# Patient Record
Sex: Female | Born: 1955 | Race: White | Hispanic: No | Marital: Single | Smoking: Former smoker
Health system: Southern US, Community
[De-identification: ages and names within clinical notes are randomized; demographics above are authoritative.]

## PROBLEM LIST (undated history)

## (undated) DIAGNOSIS — F4312 Post-traumatic stress disorder, chronic: Secondary | ICD-10-CM

## (undated) DIAGNOSIS — T50902A Poisoning by unspecified drugs, medicaments and biological substances, intentional self-harm, initial encounter: Secondary | ICD-10-CM

## (undated) DIAGNOSIS — I10 Essential (primary) hypertension: Secondary | ICD-10-CM

## (undated) DIAGNOSIS — F329 Major depressive disorder, single episode, unspecified: Secondary | ICD-10-CM

## (undated) DIAGNOSIS — F1020 Alcohol dependence, uncomplicated: Secondary | ICD-10-CM

## (undated) DIAGNOSIS — F32A Depression, unspecified: Secondary | ICD-10-CM

## (undated) HISTORY — DX: Post-traumatic stress disorder, chronic: F43.12

## (undated) HISTORY — PX: FRACTURE SURGERY: SHX138

## (undated) HISTORY — PX: GANGLION CYST EXCISION: SHX1691

---

## 1999-09-03 ENCOUNTER — Other Ambulatory Visit: Admission: RE | Admit: 1999-09-03 | Discharge: 1999-09-03 | Payer: Self-pay | Admitting: Obstetrics and Gynecology

## 2000-10-22 ENCOUNTER — Other Ambulatory Visit: Admission: RE | Admit: 2000-10-22 | Discharge: 2000-10-22 | Payer: Self-pay | Admitting: Obstetrics and Gynecology

## 2001-12-04 ENCOUNTER — Other Ambulatory Visit: Admission: RE | Admit: 2001-12-04 | Discharge: 2001-12-04 | Payer: Self-pay | Admitting: Obstetrics and Gynecology

## 2002-11-25 ENCOUNTER — Ambulatory Visit (HOSPITAL_BASED_OUTPATIENT_CLINIC_OR_DEPARTMENT_OTHER): Admission: RE | Admit: 2002-11-25 | Discharge: 2002-11-25 | Payer: Self-pay | Admitting: Orthopedic Surgery

## 2003-01-10 ENCOUNTER — Ambulatory Visit (HOSPITAL_COMMUNITY): Admission: RE | Admit: 2003-01-10 | Discharge: 2003-01-10 | Payer: Self-pay | Admitting: *Deleted

## 2003-01-10 ENCOUNTER — Encounter: Payer: Self-pay | Admitting: *Deleted

## 2003-02-28 ENCOUNTER — Other Ambulatory Visit: Admission: RE | Admit: 2003-02-28 | Discharge: 2003-02-28 | Payer: Self-pay | Admitting: Obstetrics and Gynecology

## 2004-05-18 ENCOUNTER — Other Ambulatory Visit: Admission: RE | Admit: 2004-05-18 | Discharge: 2004-05-18 | Payer: Self-pay | Admitting: Obstetrics and Gynecology

## 2004-05-22 ENCOUNTER — Encounter (INDEPENDENT_AMBULATORY_CARE_PROVIDER_SITE_OTHER): Payer: Self-pay | Admitting: Specialist

## 2004-05-22 ENCOUNTER — Encounter: Admission: RE | Admit: 2004-05-22 | Discharge: 2004-05-22 | Payer: Self-pay | Admitting: Obstetrics and Gynecology

## 2004-05-29 HISTORY — PX: BREAST BIOPSY: SHX20

## 2004-06-01 ENCOUNTER — Encounter: Admission: RE | Admit: 2004-06-01 | Discharge: 2004-06-01 | Payer: Self-pay | Admitting: Obstetrics and Gynecology

## 2004-07-01 HISTORY — PX: BREAST BIOPSY: SHX20

## 2004-09-02 HISTORY — PX: COLONOSCOPY: SHX174

## 2004-09-04 ENCOUNTER — Ambulatory Visit: Payer: Self-pay | Admitting: Internal Medicine

## 2004-09-12 ENCOUNTER — Ambulatory Visit: Payer: Self-pay | Admitting: Internal Medicine

## 2004-12-05 ENCOUNTER — Encounter: Admission: RE | Admit: 2004-12-05 | Discharge: 2004-12-05 | Payer: Self-pay | Admitting: Obstetrics and Gynecology

## 2005-05-20 ENCOUNTER — Other Ambulatory Visit: Admission: RE | Admit: 2005-05-20 | Discharge: 2005-05-20 | Payer: Self-pay | Admitting: Obstetrics and Gynecology

## 2005-05-29 ENCOUNTER — Encounter: Admission: RE | Admit: 2005-05-29 | Discharge: 2005-05-29 | Payer: Self-pay | Admitting: Obstetrics and Gynecology

## 2006-06-20 ENCOUNTER — Encounter: Admission: RE | Admit: 2006-06-20 | Discharge: 2006-06-20 | Payer: Self-pay | Admitting: Obstetrics and Gynecology

## 2006-12-07 ENCOUNTER — Emergency Department (HOSPITAL_COMMUNITY): Admission: EM | Admit: 2006-12-07 | Discharge: 2006-12-08 | Payer: Self-pay | Admitting: Emergency Medicine

## 2007-07-01 ENCOUNTER — Encounter: Admission: RE | Admit: 2007-07-01 | Discharge: 2007-07-01 | Payer: Self-pay | Admitting: Obstetrics and Gynecology

## 2008-07-04 ENCOUNTER — Encounter: Admission: RE | Admit: 2008-07-04 | Discharge: 2008-07-04 | Payer: Self-pay | Admitting: Obstetrics and Gynecology

## 2009-07-20 ENCOUNTER — Encounter: Admission: RE | Admit: 2009-07-20 | Discharge: 2009-07-20 | Payer: Self-pay | Admitting: Obstetrics and Gynecology

## 2010-07-21 ENCOUNTER — Encounter: Payer: Self-pay | Admitting: Obstetrics and Gynecology

## 2010-08-20 ENCOUNTER — Other Ambulatory Visit: Payer: Self-pay | Admitting: Obstetrics and Gynecology

## 2010-08-20 DIAGNOSIS — Z1231 Encounter for screening mammogram for malignant neoplasm of breast: Secondary | ICD-10-CM

## 2010-08-22 ENCOUNTER — Ambulatory Visit
Admission: RE | Admit: 2010-08-22 | Discharge: 2010-08-22 | Disposition: A | Discharge status: Home / Self Care | Payer: 59 | Source: Ambulatory Visit | Attending: Obstetrics and Gynecology | Admitting: Obstetrics and Gynecology

## 2010-08-22 DIAGNOSIS — Z1231 Encounter for screening mammogram for malignant neoplasm of breast: Secondary | ICD-10-CM

## 2010-11-13 NOTE — Op Note (Signed)
NAME:  LINDAANN, GRADILLA NO.:  [id number]   MEDICAL RECORD NO.:  [id number]          PATIENT TYPE:  INP   LOCATION:  2550                         FACILITY:  [facility]   PHYSICIAN:  Dyke Brackett, M.D.    DATE OF BIRTH:  Jun 29, 1956   DATE OF PROCEDURE:  DATE OF DISCHARGE:                               OPERATIVE REPORT   INDICATIONS:  A 55 year old with a displaced bimalleolar ankle fracture  involving lateral posterior malleolus, thought to be minimal to  overnight hospitalization.   PRE AND POSTOP DIAGNOSIS:  Bimalleolar ankle fracture (lateral  posterior) left ankle displaced with a fracture dislocation.   OPERATION:  Closed reduction dislocation with open reduction fixation to  bimalleolar ankle fracture.   SURGEON:  Dyke Brackett, M.D.   ASSISTANTAlisa Graff, P.A.   TOURNIQUET TIME:  Approximately 45 minutes.   Revision and reduction of dislocation was carried out.  A lateral  incision was made exposure to the fibula.  Weber-type B fibular fracture  with posterior displacement of the foot was noted preoperatively, some  correction with the close reduction was obtained, followed by anatomical  reduction of the fibula.  Fixation with the 7 hole semitubular plate  with good purchase, excellent reduction.  Anatomic reduction was  obtained.  Once the anatomic reduction was obtained, it was not required  to fix the posteromedial fragment.  There was no involvement of the  medial malleolus.  There was involvement of the posterior malleolus of  about 20% to 25% of the joint surface, but this was well aligned and  seemed to be very stable under fluoroscopy.  In view of this, the wound  was closed with 2-0 Vicryl and skin clips.  Marcaine with epinephrine to  the skin, light compressive sterile dressing, posterior plaster splint  applied.  Taken to the recovery room in stable condition.      Dyke Brackett, M.D.  Electronically Signed     WDC/MEDQ  D:  12/07/2006   T:  12/07/2006  Job:  [id number]

## 2010-11-16 NOTE — Op Note (Signed)
NAME:  [name], [name]                         ACCOUNT NO.:  [id number]   MEDICAL RECORD NO.:  [id number]                   PATIENT TYPE:  AMB   LOCATION:  DSC                                  FACILITY:  [facility]   PHYSICIAN:  Loreta Ave, M.D.              DATE OF BIRTH:  Mar 29, 1956   DATE OF PROCEDURE:  11/25/2002  DATE OF DISCHARGE:                                 OPERATIVE REPORT   PREOPERATIVE DIAGNOSIS:  Symptomatic ganglion cyst superomedial aspect,  first MP joint left foot.  Underlying synovitis, spurring, and degenerative  arthritis, first MP joint.   POSTOPERATIVE DIAGNOSIS:  Symptomatic ganglion cyst superomedial aspect,  first MP joint left foot.  Underlying synovitis, spurring, and degenerative  arthritis, first MP joint.   OPERATIVE PROCEDURE:  Excision cyst first MP joint, left foot with  synovectomy MP joint and removal of periarticular spurs.   SURGEON:  Loreta Ave, M.D.   ASSISTANT:  Arlys John D. Petrarca, P.A.-C.   ANESTHESIA:  General.   ESTIMATED BLOOD LOSS:  Minimal.   TOURNIQUET TIME:  35 minutes.   SPECIMENS:  None.   CULTURES:  None.   COMPLICATIONS:  None.   DRESSINGS:  Soft compressive with a wooden shoe.   PROCEDURE:  The patient was brought to the operating room and placed on the  operating table in supine position.  After adequate anesthesia had been  obtained, a tourniquet was applied to the left calf.  She was prepped and  draped in the usual sterile fashion.  Exsanguinated with elevation of  Esmarch, tourniquet inflated to 250 mmHg.  The previous incision she had  medially was opened and extended proximal and distal in order to retract the  skin up to the cyst which was superomedial.  The cyst was excised in its  entirety tracking it down to the MP joint with the whole root of the cyst  removed.  A longitudinal opening of the MP joint, itself.  Periarticular  spurs primarily medial and superomedial debrided.  These were not  extensive,  but after debridement and synovectomy of the joint, itself, she had easy  full passive motion with good stability.  I did not compromise the medial  capsular structures.  Only grade 2, mild grade 3 changes of the joint.  The  wound was thoroughly irrigated.  The joint was closed with Vicryl.  The  skin was closed with nylon.  The margins were injected with Marcaine.  A  sterile compressive dressing and wooden shoe was applied.  The tourniquet  was deflated and removed.  Anesthesia was reversed.  She was brought to the  recovery room.  Tolerated the surgery well without complications.  Loreta Ave, M.D.    DFM/MEDQ  D:  11/25/2002  T:  11/25/2002  Job:  [id number]

## 2011-01-28 ENCOUNTER — Emergency Department (HOSPITAL_COMMUNITY)
Admission: EM | Admit: 2011-01-28 | Discharge: 2011-01-29 | Disposition: A | Discharge status: Home / Self Care | Payer: 59 | Attending: Emergency Medicine | Admitting: Emergency Medicine

## 2011-01-28 DIAGNOSIS — F3289 Other specified depressive episodes: Secondary | ICD-10-CM | POA: Insufficient documentation

## 2011-01-28 DIAGNOSIS — T43294A Poisoning by other antidepressants, undetermined, initial encounter: Secondary | ICD-10-CM | POA: Insufficient documentation

## 2011-01-28 DIAGNOSIS — I10 Essential (primary) hypertension: Secondary | ICD-10-CM | POA: Insufficient documentation

## 2011-01-28 DIAGNOSIS — T43502A Poisoning by unspecified antipsychotics and neuroleptics, intentional self-harm, initial encounter: Secondary | ICD-10-CM | POA: Insufficient documentation

## 2011-01-28 DIAGNOSIS — E7800 Pure hypercholesterolemia, unspecified: Secondary | ICD-10-CM | POA: Insufficient documentation

## 2011-01-28 DIAGNOSIS — F329 Major depressive disorder, single episode, unspecified: Secondary | ICD-10-CM | POA: Insufficient documentation

## 2011-01-28 DIAGNOSIS — E876 Hypokalemia: Secondary | ICD-10-CM | POA: Insufficient documentation

## 2011-01-28 LAB — URINALYSIS, ROUTINE W REFLEX MICROSCOPIC
Bilirubin Urine: NEGATIVE
Glucose, UA: NEGATIVE mg/dL
Ketones, ur: NEGATIVE mg/dL
Nitrite: NEGATIVE
Protein, ur: NEGATIVE mg/dL
Specific Gravity, Urine: 1.008 (ref 1.005–1.030)
Urobilinogen, UA: 0.2 mg/dL (ref 0.0–1.0)
pH: 6 (ref 5.0–8.0)

## 2011-01-28 LAB — URINE MICROSCOPIC-ADD ON

## 2011-01-28 LAB — CBC
HCT: 41.3 % (ref 36.0–46.0)
Hemoglobin: 14 g/dL (ref 12.0–15.0)
MCH: 29.5 pg (ref 26.0–34.0)
MCHC: 33.9 g/dL (ref 30.0–36.0)
MCV: 86.9 fL (ref 78.0–100.0)
Platelets: 375 10*3/uL (ref 150–400)
RBC: 4.75 MIL/uL (ref 3.87–5.11)
RDW: 12.2 % (ref 11.5–15.5)
WBC: 5.4 10*3/uL (ref 4.0–10.5)

## 2011-01-28 LAB — COMPREHENSIVE METABOLIC PANEL
ALT: 29 U/L (ref 0–35)
AST: 24 U/L (ref 0–37)
Albumin: 4.6 g/dL (ref 3.5–5.2)
Alkaline Phosphatase: 121 U/L — ABNORMAL HIGH (ref 39–117)
BUN: 12 mg/dL (ref 6–23)
CO2: 22 mEq/L (ref 19–32)
Calcium: 9.3 mg/dL (ref 8.4–10.5)
Chloride: 102 mEq/L (ref 96–112)
Creatinine, Ser: 0.55 mg/dL (ref 0.50–1.10)
GFR calc Af Amer: >60 mL/min (ref >60)
GFR calc non Af Amer: >60 mL/min (ref >60)
Glucose, Bld: 138 mg/dL — ABNORMAL HIGH (ref 70–99)
Potassium: 3.3 mEq/L — ABNORMAL LOW (ref 3.5–5.1)
Sodium: 138 mEq/L (ref 135–145)
Total Bilirubin: 0.2 mg/dL — ABNORMAL LOW (ref 0.3–1.2)
Total Protein: 8 g/dL (ref 6.0–8.3)

## 2011-01-28 LAB — RAPID URINE DRUG SCREEN, HOSP PERFORMED
Amphetamines: NOT DETECTED
Barbiturates: NOT DETECTED
Benzodiazepines: NOT DETECTED
Cocaine: NOT DETECTED
Opiates: NOT DETECTED
Tetrahydrocannabinol: NOT DETECTED

## 2011-01-28 LAB — DIFFERENTIAL
Basophils Absolute: 0.1 10*3/uL (ref 0.0–0.1)
Basophils Relative: 1 % (ref 0–1)
Eosinophils Absolute: 0.1 10*3/uL (ref 0.0–0.7)
Eosinophils Relative: 2 % (ref 0–5)
Lymphocytes Relative: 39 % (ref 12–46)
Lymphs Abs: 2.1 10*3/uL (ref 0.7–4.0)
Monocytes Absolute: 0.5 10*3/uL (ref 0.1–1.0)
Monocytes Relative: 9 % (ref 3–12)
Neutro Abs: 2.7 10*3/uL (ref 1.7–7.7)
Neutrophils Relative %: 50 % (ref 43–77)

## 2011-01-28 LAB — ETHANOL: Alcohol, Ethyl (B): 126 mg/dL — ABNORMAL HIGH (ref 0–11)

## 2011-01-28 LAB — POCT PREGNANCY, URINE: Preg Test, Ur: NEGATIVE

## 2011-01-28 LAB — ACETAMINOPHEN LEVEL: Acetaminophen (Tylenol), Serum: <15 ug/mL (ref 10–30)

## 2011-01-30 ENCOUNTER — Emergency Department (HOSPITAL_COMMUNITY)
Admission: EM | Admit: 2011-01-30 | Discharge: 2011-01-31 | Disposition: A | Discharge status: Other Acute Inpatient Hospital | Payer: 59 | Attending: Emergency Medicine | Admitting: Emergency Medicine

## 2011-01-30 DIAGNOSIS — F1010 Alcohol abuse, uncomplicated: Secondary | ICD-10-CM | POA: Insufficient documentation

## 2011-01-30 DIAGNOSIS — T43294A Poisoning by other antidepressants, undetermined, initial encounter: Secondary | ICD-10-CM | POA: Insufficient documentation

## 2011-01-30 DIAGNOSIS — F329 Major depressive disorder, single episode, unspecified: Secondary | ICD-10-CM | POA: Insufficient documentation

## 2011-01-30 DIAGNOSIS — I10 Essential (primary) hypertension: Secondary | ICD-10-CM | POA: Insufficient documentation

## 2011-01-30 DIAGNOSIS — E7800 Pure hypercholesterolemia, unspecified: Secondary | ICD-10-CM | POA: Insufficient documentation

## 2011-01-30 DIAGNOSIS — T43502A Poisoning by unspecified antipsychotics and neuroleptics, intentional self-harm, initial encounter: Secondary | ICD-10-CM | POA: Insufficient documentation

## 2011-01-30 DIAGNOSIS — F3289 Other specified depressive episodes: Secondary | ICD-10-CM | POA: Insufficient documentation

## 2011-01-30 LAB — CBC
HCT: 38.8 % (ref 36.0–46.0)
Hemoglobin: 13.7 g/dL (ref 12.0–15.0)
MCH: 30.4 pg (ref 26.0–34.0)
MCHC: 35.3 g/dL (ref 30.0–36.0)
MCV: 86.2 fL (ref 78.0–100.0)
Platelets: 344 10*3/uL (ref 150–400)
RBC: 4.5 MIL/uL (ref 3.87–5.11)
RDW: 12.3 % (ref 11.5–15.5)
WBC: 6.4 10*3/uL (ref 4.0–10.5)

## 2011-01-30 LAB — DIFFERENTIAL
Basophils Absolute: 0 10*3/uL (ref 0.0–0.1)
Basophils Relative: 1 % (ref 0–1)
Eosinophils Absolute: 0.1 10*3/uL (ref 0.0–0.7)
Eosinophils Relative: 1 % (ref 0–5)
Lymphocytes Relative: 38 % (ref 12–46)
Lymphs Abs: 2.4 10*3/uL (ref 0.7–4.0)
Monocytes Absolute: 0.5 10*3/uL (ref 0.1–1.0)
Monocytes Relative: 8 % (ref 3–12)
Neutro Abs: 3.4 10*3/uL (ref 1.7–7.7)
Neutrophils Relative %: 53 % (ref 43–77)

## 2011-01-30 LAB — BASIC METABOLIC PANEL
Chloride: 100 mEq/L (ref 96–112)
Creatinine, Ser: 0.51 mg/dL (ref 0.50–1.10)
GFR calc Af Amer: >60 mL/min (ref >60)
GFR calc non Af Amer: >60 mL/min (ref >60)

## 2011-01-30 LAB — ETHANOL: Alcohol, Ethyl (B): 186 mg/dL — ABNORMAL HIGH (ref 0–11)

## 2011-01-30 LAB — SALICYLATE LEVEL: Salicylate Lvl: <2 mg/dL — ABNORMAL LOW (ref 2.8–20.0)

## 2011-01-30 LAB — ACETAMINOPHEN LEVEL: Acetaminophen (Tylenol), Serum: <15 ug/mL (ref 10–30)

## 2011-01-31 ENCOUNTER — Inpatient Hospital Stay (HOSPITAL_COMMUNITY)
Admission: AD | Admit: 2011-01-31 | Discharge: 2011-02-04 | DRG: 897 | Disposition: A | Discharge status: Home / Self Care | Payer: 59 | Source: Ambulatory Visit | Attending: Psychiatry | Admitting: Psychiatry

## 2011-01-31 DIAGNOSIS — F3289 Other specified depressive episodes: Secondary | ICD-10-CM

## 2011-01-31 DIAGNOSIS — I10 Essential (primary) hypertension: Secondary | ICD-10-CM

## 2011-01-31 DIAGNOSIS — F17200 Nicotine dependence, unspecified, uncomplicated: Secondary | ICD-10-CM

## 2011-01-31 DIAGNOSIS — R45851 Suicidal ideations: Secondary | ICD-10-CM

## 2011-01-31 DIAGNOSIS — Z818 Family history of other mental and behavioral disorders: Secondary | ICD-10-CM

## 2011-01-31 DIAGNOSIS — E876 Hypokalemia: Secondary | ICD-10-CM

## 2011-01-31 DIAGNOSIS — F1020 Alcohol dependence, uncomplicated: Principal | ICD-10-CM

## 2011-01-31 DIAGNOSIS — E7800 Pure hypercholesterolemia, unspecified: Secondary | ICD-10-CM

## 2011-01-31 DIAGNOSIS — F329 Major depressive disorder, single episode, unspecified: Secondary | ICD-10-CM

## 2011-01-31 LAB — ACETAMINOPHEN LEVEL: Acetaminophen (Tylenol), Serum: <15 ug/mL (ref 10–30)

## 2011-01-31 LAB — RAPID URINE DRUG SCREEN, HOSP PERFORMED: Benzodiazepines: NOT DETECTED

## 2011-02-01 DIAGNOSIS — F1020 Alcohol dependence, uncomplicated: Secondary | ICD-10-CM

## 2011-02-05 NOTE — Discharge Summary (Signed)
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  She had beta drinking excessively for the past year.  On Monday night she took approximately 6 tablets of Celexa concurrently with drinking about 6-7 beers.  Two nights later she drank another 6-7 beers along with 10 tablets of Celexa.  She denied suicidal thoughts but seems to get suicidal thinking when she drinks excessively.  HOSPITAL COURSE:  Her potassium was a little low in the emergency room at 3.0.  It was repeated and not considered to be a problem.  During the hospital stay, the family pointed out to the weekend therapist that the patient had planned to simply say that everything was fine and there were no problems so that she could get out of any further involvement in treatment.  Her discharge was delayed by 1 day in order for further intensive outpatient work to be arranged, and this was arranged.  CONDITION ON DISCHARGE:  It was noted that the patient was in a euthymic mood.  On a scale from 1 being the least to 10 the most depressed, she ranked her mood as a 1, and on a scale of 1 the least and 10 the most anxious, she had also rated herself as a 1.  She had clear goal-directed thoughts.  Her conversational speech was natural volume, tone and rate. She had good eye contact.  Insight was improved from input from her family and that she was able to see that her substance use was more of a problem than originally addressed or thought.  Her judgment was also thought to be improving some.  Her memory was intact to recent and remote  events.  FINAL DIAGNOSES:  Axis I: 1. Alcohol dependence. 2. Depressive disorder NOS. 3. Nicotine dependence. Axis II:  Deferred. Axis III:  Hypercholesterolemia and hypertension. Axis IV:  Moderate, occupational and psychosocial stressors related to substance use. Axis V:  GAF 55.  INSTRUCTIONS: 1. Instructions to the family and to the patient were to resume     typical physical activity. 2. Her home medications included Norvasc and simvastatin as well as     the Celexa 20 mg that she was already on. 3. It was recommended that she engage herself in AA meetings, get     involved in the step program with a sponsor so that she could     better accurately address her alcohol issues and also seek to     protect herself by further     working with her employer to find others that can help with the     role that she is currently filling by herself. 4. She also chose to continue the nicotine patches which could help     her be  able to stop the nicotine addiction that she has as well.          ______________________________ Orson Aloe, MD     EW/MEDQ  D:  02/04/2011  T:  02/04/2011  Job:  [id number]  Electronically Signed by Orson Aloe  on 02/05/2011 08:00:27 AM

## 2011-02-05 NOTE — Assessment & Plan Note (Signed)
[name], [name] NO.:  [id number]  MEDICAL RECORD NO.:  [id number]  LOCATION:  0303                          [facility]ACILITY[facility]  [facility]  PHYSICIAN:  Orson Aloe, MD       DATE OF BIRTH:  08/31/1955  DATE OF ADMISSION:  01/31/2011 DATE OF DISCHARGE:                      PSYCHIATRIC ADMISSION ASSESSMENT   IDENTIFICATION:  This is a 55 year old single Caucasian female.  This is a voluntary admission.  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  She reports that she has been drinking excessively for the past year and on Monday night took approximately 6 tablets of her Celexa concurrent with drinking about 6 to 7 beers.  She reports going to the emergency room and subsequently going home.  Denies that she has suicidal intent but when she is drinking does get suicidal thoughts.  On Wednesday then she drank 6 to 7 beers and took approximately 10 tablets of Celexa.  She was referred for admission.  She denies suicidal thoughts today but says she does get suicidal when she is drinking excessively.  She has been drinking a lot for about 1 year now and that is approximately 6 to 7 beers on a typical week night after work and more than 13 beers on a weekend starting in the late afternoon and through the evening and drinks until she passes out.  She is not drinking in the morning prior to work but endorses that her concentration is impaired at work when she is not drinking.  She is asking for help getting off the alcohol and wants to go for sobriety.  PAST PSYCHIATRIC HISTORY:  No previous psychiatric admissions.  She receives Celexa from her primary care physician at [facility].  She has been on Celexa for about 5 years after her youngest son moved in with his father after the father left her.  She is taking 10 mg a day for many years and only within the last few weeks started taking a 20 mg tablet. She  would like to stay on it and feels that it helps her depression and anxiety.  No history of previous suicidal thoughts.  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  She lives alone.  Has supportive siblings and her mother is supportive.  FAMILY HISTORY:  Mother with depression with history of hospitalizations.  ALCOHOL AND DRUG HISTORY:  Denies any history of other substance abuse. Alcohol abuse noted above.  MEDICAL HISTORY:  Primary care provider is Dr. __________ at [facility] on [address]. No history of seizures.  MEDICAL PROBLEMS:  Dyslipidemia and hypertension.  CURRENT MEDICATIONS: 1. Amlodipine 10 mg daily. 2. Celexa 20 mg daily. 3. Zocor 20 mg daily.  POSITIVE PHYSICAL FINDINGS:  Well-nourished, well-developed female with full physical exam as noted in the emergency room.  Alcohol level was 186.  Chemistry and CBC normal with the exception of a potassium of 3.0 which was repleted in the emergency room.  PAST PSYCHIATRIC HISTORY:  Please note no previous treatment for alcohol or substance abuse.  MENTAL STATUS EXAM:  Fully alert, fully  oriented, pleasant 55 year old in full contact with reality.  Good eye contact and speech is normal. Gives a coherent history.  Mood is neutral.  She feels a little bit ashamed of taking the pills.  Clearly recognizes that her alcohol as a problem for her, wants to get off of it, wants to achieve sobriety.  No signs of delirium or confusion.  Thinking is goal directed.  Clearly denying any suicidal thoughts.  Memory intact.  Insight good.  Impulse control and judgment normal.  AXIS I:  Alcohol dependence, depressive disorder NOS. AXIS II:  Deferred. AXIS III:  Hypokalemia repleted in the ED.  Hypertension and dyslipidemia. 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 AXIS V:  Current 46, past year 79.  PLAN:  To  voluntarily admit her with a goal of a safe detox in 4 days and to alleviate any suicidal thoughts.  She is clearly not suicidal today. Her liver enzymes have not been checked but she reports that her primary care physician has been monitoring these on a regular basis most recently 2 months ago when she was drinking in the same pattern, so she has quite a lot of difficulties with venipunctures and she is asymptomatic so we will defer her liver enzymes and allow her primary care physician to follow those.  She has a mild headache today, possibly due to the extra Celexa, wants to continue the Celexa at 20 mg daily and we will defer that until 08/04 and continue her other routine medications.  She is on a Librium protocol was goal of a safe detox in 4 days.     [name], N.P.   ______________________________ Orson Aloe, MD    MAS/MEDQ  D:  02/01/2011  T:  02/01/2011  Job:  [id number]  Electronically Signed by Kari Baars N.P. on 02/04/2011 08:06:21 AM Electronically Signed by Orson Aloe  on 02/05/2011 08:00:22 AM
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
## 2011-04-18 LAB — CBC
HCT: 32.9 — ABNORMAL LOW
HCT: 39.8
Hemoglobin: 13.4
MCV: 89.6
Platelets: 262
RBC: 3.67 — ABNORMAL LOW
RBC: 4.45
WBC: 12.2 — ABNORMAL HIGH
WBC: 9.5

## 2011-04-18 LAB — I-STAT 8, (EC8 V) (CONVERTED LAB)
BUN: 12
Chloride: 104
Glucose, Bld: 110 — ABNORMAL HIGH
Hemoglobin: 14.6
Potassium: 4.1
Sodium: 134 — ABNORMAL LOW
pH, Ven: 7.317 — ABNORMAL HIGH

## 2011-04-18 LAB — BASIC METABOLIC PANEL
Chloride: 103
GFR calc Af Amer: >60
GFR calc non Af Amer: >60
Potassium: 3.6

## 2011-07-10 ENCOUNTER — Emergency Department (HOSPITAL_COMMUNITY)
Admission: EM | Admit: 2011-07-10 | Discharge: 2011-07-11 | Disposition: A | Discharge status: Home / Self Care | Payer: 59 | Attending: Emergency Medicine | Admitting: Emergency Medicine

## 2011-07-10 ENCOUNTER — Encounter (HOSPITAL_COMMUNITY): Payer: Self-pay

## 2011-07-10 DIAGNOSIS — F329 Major depressive disorder, single episode, unspecified: Secondary | ICD-10-CM | POA: Insufficient documentation

## 2011-07-10 DIAGNOSIS — I10 Essential (primary) hypertension: Secondary | ICD-10-CM | POA: Insufficient documentation

## 2011-07-10 DIAGNOSIS — T50901A Poisoning by unspecified drugs, medicaments and biological substances, accidental (unintentional), initial encounter: Secondary | ICD-10-CM | POA: Insufficient documentation

## 2011-07-10 DIAGNOSIS — R404 Transient alteration of awareness: Secondary | ICD-10-CM | POA: Insufficient documentation

## 2011-07-10 DIAGNOSIS — F32A Depression, unspecified: Secondary | ICD-10-CM

## 2011-07-10 DIAGNOSIS — T50904A Poisoning by unspecified drugs, medicaments and biological substances, undetermined, initial encounter: Secondary | ICD-10-CM | POA: Insufficient documentation

## 2011-07-10 DIAGNOSIS — R9431 Abnormal electrocardiogram [ECG] [EKG]: Secondary | ICD-10-CM | POA: Insufficient documentation

## 2011-07-10 DIAGNOSIS — F3289 Other specified depressive episodes: Secondary | ICD-10-CM | POA: Insufficient documentation

## 2011-07-10 HISTORY — DX: Essential (primary) hypertension: I10

## 2011-07-10 NOTE — ED Notes (Signed)
Pt denies suicidal thoughts however has been taking her medications since 1930 because she's depressed and doesn't want to feel that way anymore

## 2011-07-11 ENCOUNTER — Other Ambulatory Visit: Payer: Self-pay

## 2011-07-11 LAB — URINALYSIS, ROUTINE W REFLEX MICROSCOPIC
Glucose, UA: NEGATIVE mg/dL
Specific Gravity, Urine: 1.008 (ref 1.005–1.030)
pH: 6 (ref 5.0–8.0)

## 2011-07-11 LAB — COMPREHENSIVE METABOLIC PANEL
Albumin: 4.3 g/dL (ref 3.5–5.2)
Alkaline Phosphatase: 99 U/L (ref 39–117)
BUN: 11 mg/dL (ref 6–23)
Chloride: 100 mEq/L (ref 96–112)
Creatinine, Ser: 0.53 mg/dL (ref 0.50–1.10)
GFR calc Af Amer: >90 mL/min (ref >90)
Glucose, Bld: 109 mg/dL — ABNORMAL HIGH (ref 70–99)
Potassium: 2.9 mEq/L — ABNORMAL LOW (ref 3.5–5.1)
Total Bilirubin: 0.2 mg/dL — ABNORMAL LOW (ref 0.3–1.2)

## 2011-07-11 LAB — SALICYLATE LEVEL: Salicylate Lvl: <2 mg/dL — ABNORMAL LOW (ref 2.8–20.0)

## 2011-07-11 LAB — CBC
HCT: 38.5 % (ref 36.0–46.0)
Hemoglobin: 13.4 g/dL (ref 12.0–15.0)
MCHC: 34.8 g/dL (ref 30.0–36.0)
MCV: 86.3 fL (ref 78.0–100.0)
RDW: 12.4 % (ref 11.5–15.5)
WBC: 5.1 10*3/uL (ref 4.0–10.5)

## 2011-07-11 LAB — ACETAMINOPHEN LEVEL: Acetaminophen (Tylenol), Serum: <15 ug/mL (ref 10–30)

## 2011-07-11 LAB — URINE MICROSCOPIC-ADD ON

## 2011-07-11 LAB — RAPID URINE DRUG SCREEN, HOSP PERFORMED: Opiates: NOT DETECTED

## 2011-07-11 LAB — ETHANOL: Alcohol, Ethyl (B): 211 mg/dL — ABNORMAL HIGH (ref 0–11)

## 2011-07-11 MED ORDER — POTASSIUM CHLORIDE CRYS ER 20 MEQ PO TBCR
40.0000 meq | EXTENDED_RELEASE_TABLET | Freq: Once | ORAL | Status: AC
  Administered 2011-07-11: 40 meq via ORAL
  Filled 2011-07-11: qty 2

## 2011-07-11 MED ORDER — AMLODIPINE BESYLATE 5 MG PO TABS
5.0000 mg | ORAL_TABLET | Freq: Every day | ORAL | Status: DC
  Filled 2011-07-11: qty 1

## 2011-07-11 MED ORDER — LORAZEPAM 1 MG PO TABS: 1.0000 mg | ORAL_TABLET | Freq: Three times a day (TID) | ORAL | Status: DC | PRN

## 2011-07-11 MED ORDER — ONDANSETRON HCL 4 MG PO TABS: 4.0000 mg | ORAL_TABLET | Freq: Three times a day (TID) | ORAL | Status: DC | PRN

## 2011-07-11 MED ORDER — SIMVASTATIN 20 MG PO TABS
20.0000 mg | ORAL_TABLET | Freq: Every day | ORAL | Status: DC
  Filled 2011-07-11: qty 1

## 2011-07-11 MED ORDER — ACETAMINOPHEN 325 MG PO TABS: 650.0000 mg | ORAL_TABLET | ORAL | Status: DC | PRN

## 2011-07-11 MED ORDER — ALUM & MAG HYDROXIDE-SIMETH 200-200-20 MG/5ML PO SUSP: 30.0000 mL | ORAL | Status: DC | PRN

## 2011-07-11 MED ORDER — IBUPROFEN 200 MG PO TABS: 600.0000 mg | ORAL_TABLET | Freq: Three times a day (TID) | ORAL | Status: DC | PRN

## 2011-07-11 MED ORDER — ZOLPIDEM TARTRATE 5 MG PO TABS: 5.0000 mg | ORAL_TABLET | Freq: Every evening | ORAL | Status: DC | PRN

## 2011-07-11 MED ORDER — NICOTINE 21 MG/24HR TD PT24: 21.0000 mg | MEDICATED_PATCH | Freq: Every day | TRANSDERMAL | Status: DC | PRN

## 2011-07-11 MED ORDER — SODIUM BICARBONATE 8.4 % IV SOLN
25.0000 meq | Freq: Once | INTRAVENOUS | Status: AC
  Administered 2011-07-11: 25 meq via INTRAVENOUS
  Filled 2011-07-11: qty 50

## 2011-07-11 NOTE — ED Notes (Signed)
-    POISON CONTROL CALLED TO INQUIRE ABOUT PT.

## 2011-07-11 NOTE — BH Assessment (Signed)
Assessment Note   [name] is an 56 y.o. female. Patient has hx of depression and was bought to the ED following OD of Celexa. Patient took 10 Celexa and reports having feelings of loneliness last night.  Patient was previously admitted to [facility] for same in August 2012. Patient states she becomes increasingly depressed as she drinks more and although she was referred to AA following her discharge from [facility], she no longer attends meetings.  Patient states she lives alone, but her son (age 3) lives across the street from her and sees him everyday. Patient also reports her siblings and mother as being very supportive.   Patient states that inpatient treatment was helpful, however, she does not wish to return.  Patient does not acknowledge substance abuse as being a significant factor in her SI attempts.  CSW has requested a telepsych for disposition.   Substance: ETOH (Beer) Average Use: 3-4 12 ounce per night Last use: 07/10/2011; 9 12 ounce  Axis I: Substance Induced Mood Disorder Axis II: Deferred Axis III:  Past Medical History  Diagnosis Date  . Hypertension    Axis IV: problems related to social environment and problems with primary support group Axis V: 51-60 moderate symptoms  Past Medical History:  Past Medical History  Diagnosis Date  . Hypertension     History reviewed. No pertinent past surgical history.  Family History: History reviewed. No pertinent family history.  Social History:  does not have a smoking history on file. She does not have any smokeless tobacco history on file. She reports that she drinks alcohol. Her drug history not on file.  Additional Social History:    Allergies: No Known Allergies  Home Medications:  Medications Prior to Admission  Medication Dose Route Frequency Provider Last Rate Last Dose  . acetaminophen (TYLENOL) tablet 650 mg  650 mg Oral Q4H PRN Vida Roller, MD      . alum & mag hydroxide-simeth (MAALOX/MYLANTA) 200-200-20 MG/5ML  suspension 30 mL  30 mL Oral PRN Vida Roller, MD      . amLODipine (NORVASC) tablet 5 mg  5 mg Oral Daily Vida Roller, MD      . ibuprofen (ADVIL,MOTRIN) tablet 600 mg  600 mg Oral Q8H PRN Vida Roller, MD      . LORazepam (ATIVAN) tablet 1 mg  1 mg Oral Q8H PRN Vida Roller, MD      . nicotine (NICODERM CQ - dosed in mg/24 hours) patch 21 mg  21 mg Transdermal Daily PRN Vida Roller, MD      . ondansetron [facility]) tablet 4 mg  4 mg Oral Q8H PRN Vida Roller, MD      . potassium chloride SA (K-DUR,KLOR-CON) CR tablet 40 mEq  40 mEq Oral Once Vida Roller, MD   40 mEq at 07/11/11 0248  . simvastatin (ZOCOR) tablet 20 mg  20 mg Oral QHS Vida Roller, MD      . sodium bicarbonate injection 25 mEq  25 mEq Intravenous Once Vida Roller, MD   25 mEq at 07/11/11 0120  . zolpidem (AMBIEN) tablet 5 mg  5 mg Oral QHS PRN Vida Roller, MD       Medications Prior to Admission  Medication Sig Dispense Refill  . simvastatin (ZOCOR) 20 MG tablet Take 20 mg by mouth at bedtime.        OB/GYN Status:  No LMP recorded.  General Assessment Data Location of Assessment:  [facility] ED ACT Assessment: Yes Living Arrangements: Alone Can pt return to current living arrangement?: Yes Admission Status: Voluntary Is patient capable of signing voluntary admission?: Yes Transfer from: Acute Hospital Referral Source: Self/Family/Friend     Risk to self Suicidal Ideation: No-Not Currently/Within Last 6 Months Suicidal Intent: No-Not Currently/Within Last 6 Months Is patient at risk for suicide?: No Suicidal Plan?: No-Not Currently/Within Last 6 Months (Hx of OD) Access to Means: Yes ( ) Specify Access to Suicidal Means:  (RX) What has been your use of drugs/alcohol within the last 12 months?: Currently drinks beer Previous Attempts/Gestures: Yes How many times?: 1  (August) Triggers for Past Attempts: Other (Comment) (Alcohol abuse) Intentional Self Injurious Behavior: None Family Suicide  History: No Recent stressful life event(s): Conflict (Comment) (Psychosocial) Persecutory voices/beliefs?: No Depression: Yes Depression Symptoms: Tearfulness;Feeling worthless/self pity;Loss of interest in usual pleasures;Isolating Substance abuse history and/or treatment for substance abuse?: Yes Suicide prevention information given to non-admitted patients: Not applicable  Risk to Others Homicidal Ideation: No Thoughts of Harm to Others: No Current Homicidal Intent: No Current Homicidal Plan: No Access to Homicidal Means: No History of harm to others?: No Assessment of Violence: None Noted Does patient have access to weapons?: No Criminal Charges Pending?: No Does patient have a court date: No  Psychosis Hallucinations: None noted Delusions: None noted  Mental Status Report Appear/Hygiene: Meticulous Eye Contact: Fair Motor Activity: Unable to assess (Patient in bed, unable to assess) Speech: Soft;Slow;Slurred Level of Consciousness: Drowsy;Quiet/awake Mood: Depressed;Empty;Despair;Sad Affect: Depressed;Sad Anxiety Level: None Thought Processes: Relevant Judgement: Unimpaired Orientation: Person;Place;Time;Situation Obsessive Compulsive Thoughts/Behaviors: None  Cognitive Functioning Concentration: Decreased Memory: Recent Intact;Remote Intact IQ: Average Insight: Poor Impulse Control: Poor Appetite: Good Sleep: No Change Vegetative Symptoms: None  Prior Inpatient Therapy Prior Inpatient Therapy: Yes Prior Therapy Dates: 01/2011 Prior Therapy Facilty/Provider(s): [facility] Reason for Treatment: SI attempt/ETOH abuse  Prior Outpatient Therapy Prior Outpatient Therapy: No                     Additional Information 1:1 In Past 12 Months?: No CIRT Risk: No Elopement Risk: No Does patient have medical clearance?: Yes     Disposition: Pending telepsych evaluation.    On Site Evaluation by:   Reviewed with Physician:     Ileene Hutchinson 07/11/2011 10:44 AM

## 2011-07-11 NOTE — Discharge Planning (Signed)
Patient was assessed by telepsych who recommends discharge. CSW discussed with EDP who agrees with disposition. Patient's nurse notified.  Ileene Hutchinson , MSW, LCSWA 07/11/2011 3:09 PM [phone]

## 2011-07-11 NOTE — ED Notes (Signed)
Patient denies pain and is resting comfortably.  

## 2011-07-11 NOTE — ED Notes (Signed)
Pt. and belongings both wanded by security 

## 2011-07-11 NOTE — ED Provider Notes (Signed)
History     CSN: [id number]  Arrival date & time 07/10/11  2352   First MD Initiated Contact with Patient 07/11/11 0004      Chief Complaint  Patient presents with  . Drug Overdose    (Consider location/radiation/quality/duration/timing/severity/associated sxs/prior treatment) HPI Comments: 56 year old female with a history of depression and anxiety currently taking Celexa. She states that she is feeling more depressed because she lives by herself. Over the last 5 hours she has taken approximately 15 tabs of Celexa. She did this in an attempt to "I don't want to feel the pain anymore". She states this his mental anguish not physical pain. She admits to having suicidal thoughts and they overdose attempt in the last year during which she [facility]. Symptoms are severe, they have gradually gotten worse over the last several months. There is no associated hallucinations, homicidal thoughts. She didn't denies other sources of self injury. She personally called her sister because she was worried about what she had done after the fact.  The history is provided by the patient and medical records.    Past Medical History  Diagnosis Date  . Hypertension     History reviewed. No pertinent past surgical history.  History reviewed. No pertinent family history.  History  Substance Use Topics  . Smoking status: Not on file  . Smokeless tobacco: Not on file  . Alcohol Use: Yes    OB History    Grav Para Term Preterm Abortions TAB SAB Ect Mult Living                  Review of Systems  All other systems reviewed and are negative.    Allergies  Review of patient's allergies indicates no known allergies.  Home Medications   Current Outpatient Rx  Name Route Sig Dispense Refill  . AMLODIPINE BESYLATE 5 MG PO TABS Oral Take 5 mg by mouth daily.    Marland Kitchen CITALOPRAM HYDROBROMIDE 20 MG PO TABS Oral Take 20 mg by mouth daily.      BP 94/67  Pulse 80   Temp(Src) 98.6 F (37 C) (Oral)  Resp 20  SpO2 95%  Physical Exam  Nursing note and vitals reviewed. Constitutional: She appears well-developed and well-nourished. No distress.  HENT:  Head: Normocephalic and atraumatic.  Mouth/Throat: Oropharynx is clear and moist. No oropharyngeal exudate.  Eyes: Conjunctivae and EOM are normal. Pupils are equal, round, and reactive to light. Right eye exhibits no discharge. Left eye exhibits no discharge. No scleral icterus.  Neck: Normal range of motion. Neck supple. No JVD present. No thyromegaly present.  Cardiovascular: Normal rate, regular rhythm, normal heart sounds and intact distal pulses.  Exam reveals no gallop and no friction rub.   No murmur heard. Pulmonary/Chest: Effort normal and breath sounds normal. No respiratory distress. She has no wheezes. She has no rales.  Abdominal: Soft. Bowel sounds are normal. She exhibits no distension and no mass. There is no tenderness.  Musculoskeletal: Normal range of motion. She exhibits no edema and no tenderness.  Lymphadenopathy:    She has no cervical adenopathy.  Neurological: She is alert. Coordination normal.       Somnolent but easily arousable, moves all extremities, speech is clear, motor 5 out of 5 in all extremities, cranial nerves III through XII intact.  Has ambulated to the bathroom with some ataxia  Skin: Skin is warm and dry. No rash noted. No erythema.  Psychiatric: She has a normal  mood and affect. Her behavior is normal.    ED Course  Procedures (including critical care time)   Labs Reviewed  CBC  COMPREHENSIVE METABOLIC PANEL  ETHANOL  URINE RAPID DRUG SCREEN (HOSP PERFORMED)  URINALYSIS, ROUTINE W REFLEX MICROSCOPIC  ACETAMINOPHEN LEVEL  SALICYLATE LEVEL   No results found.   No diagnosis found.    MDM  OD reported to Poison control - watch for bradycardia, hypertension, sleepiness, QRS and QTC prolongation.  600mg  approx OD - can have effects up to 24 hours.   Possible hypotension.  6 hour obs sufficient.   At this time the patient has slight hypotension at 94 systolic, there is no tachycardia or fever. I discussed with poison control see recommendations above. Labs ordered, EKG, observation prior to placement for medical clearance  ED ECG REPORT   Date: 07/11/2011   Rate: 71  Rhythm: normal sinus rhythm  QRS Axis: normal  Intervals: QT prolonged  ST/T Wave abnormalities: normal  Conduction Disutrbances:none  Narrative Interpretation:   Old EKG Reviewed: changes noted and Compared with EKG from August 2012 she currently has a prolonged QTC to 496, prior QTC was 440.  Sodium bicarbonate ordered for prolonged QTC, will reevaluate.   Repeat ECG  0245 AM  ED ECG REPORT   Date: 07/11/2011   Rate: 71  Rhythm: normal sinus rhythm  QRS Axis: normal  Intervals: normal  ST/T Wave abnormalities: normal  Conduction Disutrbances:none  Narrative Interpretation:   Old EKG Reviewed: changes noted and QTc interval has reduced from 496-452 after sodium   Change of shift - care signed out to Dr. Fredricka Bonine at 8 AM 07/11/11  Vida Roller, MD 07/12/11 [phone]

## 2011-07-11 NOTE — ED Notes (Signed)
1 pair of shoes, 1 pair of pink socks, 1 long-sleeve shirt, 1 short sleeve shirt, 1 pair of athletic pants, 1 pair of underwear, 1 purse: 1 bottle of Citalopram (empty), 1 bottle of Simvastatin (14 pills), Amlodipine (15 pills), 1 cell phone, 2 set of car key, 1 pack of cigarettes. 1 bag of belongings located behind nurses station.

## 2011-07-11 NOTE — ED Notes (Signed)
Patient is resting comfortably. 

## 2011-07-11 NOTE — ED Provider Notes (Signed)
I assumed care at shift change.  After telepsych consultation, patient is appropriate and stable for discharge.  Geoffery Lyons, MD 07/11/11 [phone]

## 2011-07-11 NOTE — ED Notes (Signed)
Vital signs stable. 

## 2011-07-11 NOTE — ED Notes (Signed)
Pt. In red socks and blue scrubs. Pt. Ambulated to restroom with 1 assist. Gait unsteady.

## 2011-07-24 ENCOUNTER — Encounter: Payer: Self-pay | Admitting: Internal Medicine

## 2011-08-15 ENCOUNTER — Other Ambulatory Visit: Payer: Self-pay | Admitting: Obstetrics and Gynecology

## 2011-08-15 DIAGNOSIS — Z1231 Encounter for screening mammogram for malignant neoplasm of breast: Secondary | ICD-10-CM

## 2011-08-26 ENCOUNTER — Ambulatory Visit
Admission: RE | Admit: 2011-08-26 | Discharge: 2011-08-26 | Disposition: A | Discharge status: Home / Self Care | Payer: 59 | Source: Ambulatory Visit | Attending: Obstetrics and Gynecology | Admitting: Obstetrics and Gynecology

## 2011-08-26 DIAGNOSIS — Z1231 Encounter for screening mammogram for malignant neoplasm of breast: Secondary | ICD-10-CM

## 2011-11-13 ENCOUNTER — Emergency Department (HOSPITAL_COMMUNITY)
Admission: EM | Admit: 2011-11-13 | Discharge: 2011-11-14 | Disposition: A | Discharge status: Home / Self Care | Payer: 59

## 2011-11-13 NOTE — ED Notes (Signed)
Pt called at 10:30, 10:41, and 11:00 from triage to be registered but no answer. Triage RN aware

## 2011-12-08 ENCOUNTER — Emergency Department (HOSPITAL_COMMUNITY)
Admission: EM | Admit: 2011-12-08 | Discharge: 2011-12-09 | Disposition: A | Discharge status: Other Acute Inpatient Hospital | Payer: 59 | Attending: Emergency Medicine | Admitting: Emergency Medicine

## 2011-12-08 ENCOUNTER — Encounter (HOSPITAL_COMMUNITY): Payer: Self-pay | Admitting: *Deleted

## 2011-12-08 DIAGNOSIS — F1010 Alcohol abuse, uncomplicated: Secondary | ICD-10-CM | POA: Insufficient documentation

## 2011-12-08 DIAGNOSIS — F329 Major depressive disorder, single episode, unspecified: Secondary | ICD-10-CM | POA: Insufficient documentation

## 2011-12-08 DIAGNOSIS — F17200 Nicotine dependence, unspecified, uncomplicated: Secondary | ICD-10-CM | POA: Insufficient documentation

## 2011-12-08 DIAGNOSIS — F3289 Other specified depressive episodes: Secondary | ICD-10-CM | POA: Insufficient documentation

## 2011-12-08 DIAGNOSIS — E876 Hypokalemia: Secondary | ICD-10-CM | POA: Insufficient documentation

## 2011-12-08 DIAGNOSIS — R45851 Suicidal ideations: Secondary | ICD-10-CM | POA: Insufficient documentation

## 2011-12-08 DIAGNOSIS — I10 Essential (primary) hypertension: Secondary | ICD-10-CM | POA: Insufficient documentation

## 2011-12-08 HISTORY — DX: Poisoning by unspecified drugs, medicaments and biological substances, intentional self-harm, initial encounter: T50.902A

## 2011-12-08 HISTORY — DX: Major depressive disorder, single episode, unspecified: F32.9

## 2011-12-08 HISTORY — DX: Depression, unspecified: F32.A

## 2011-12-08 LAB — ETHANOL: Alcohol, Ethyl (B): 207 mg/dL — ABNORMAL HIGH (ref 0–11)

## 2011-12-08 LAB — COMPREHENSIVE METABOLIC PANEL
ALT: 31 U/L (ref 0–35)
BUN: 11 mg/dL (ref 6–23)
Calcium: 9.2 mg/dL (ref 8.4–10.5)
Creatinine, Ser: 0.57 mg/dL (ref 0.50–1.10)
GFR calc Af Amer: >90 mL/min (ref >90)
GFR calc non Af Amer: >90 mL/min (ref >90)
Glucose, Bld: 88 mg/dL (ref 70–99)
Sodium: 136 mEq/L (ref 135–145)
Total Protein: 7.8 g/dL (ref 6.0–8.3)

## 2011-12-08 LAB — CBC
HCT: 41.9 % (ref 36.0–46.0)
MCH: 30.4 pg (ref 26.0–34.0)
MCV: 86.7 fL (ref 78.0–100.0)
Platelets: 334 10*3/uL (ref 150–400)
RBC: 4.83 MIL/uL (ref 3.87–5.11)

## 2011-12-08 LAB — RAPID URINE DRUG SCREEN, HOSP PERFORMED
Benzodiazepines: NOT DETECTED
Cocaine: NOT DETECTED
Opiates: NOT DETECTED

## 2011-12-08 LAB — DIFFERENTIAL
Eosinophils Absolute: 0.1 10*3/uL (ref 0.0–0.7)
Eosinophils Relative: 1 % (ref 0–5)
Lymphs Abs: 2.1 10*3/uL (ref 0.7–4.0)
Monocytes Absolute: 0.3 10*3/uL (ref 0.1–1.0)

## 2011-12-08 MED ORDER — CITALOPRAM HYDROBROMIDE 20 MG PO TABS: 20.0000 mg | ORAL_TABLET | Freq: Every day | ORAL | Status: DC

## 2011-12-08 MED ORDER — ADULT MULTIVITAMIN W/MINERALS CH
1.0000 | ORAL_TABLET | Freq: Every day | ORAL | Status: DC
  Administered 2011-12-08: 1 via ORAL
  Filled 2011-12-08: qty 1

## 2011-12-08 MED ORDER — FOLIC ACID 1 MG PO TABS
1.0000 mg | ORAL_TABLET | Freq: Every day | ORAL | Status: DC
  Administered 2011-12-08: 1 mg via ORAL
  Filled 2011-12-08: qty 1

## 2011-12-08 MED ORDER — NICOTINE 21 MG/24HR TD PT24
21.0000 mg | MEDICATED_PATCH | Freq: Every day | TRANSDERMAL | Status: DC
  Administered 2011-12-08: 21 mg via TRANSDERMAL
  Filled 2011-12-08: qty 1

## 2011-12-08 MED ORDER — LORAZEPAM 1 MG PO TABS: 0.0000 mg | ORAL_TABLET | Freq: Two times a day (BID) | ORAL | Status: DC

## 2011-12-08 MED ORDER — ARIPIPRAZOLE 2 MG PO TABS: 2.0000 mg | ORAL_TABLET | Freq: Every day | ORAL | Status: DC

## 2011-12-08 MED ORDER — LORAZEPAM 2 MG/ML IJ SOLN: 1.0000 mg | Freq: Four times a day (QID) | INTRAMUSCULAR | Status: DC | PRN

## 2011-12-08 MED ORDER — THIAMINE HCL 100 MG/ML IJ SOLN: 100.0000 mg | Freq: Every day | INTRAMUSCULAR | Status: DC

## 2011-12-08 MED ORDER — ALUM & MAG HYDROXIDE-SIMETH 200-200-20 MG/5ML PO SUSP: 30.0000 mL | ORAL | Status: DC | PRN

## 2011-12-08 MED ORDER — IBUPROFEN 600 MG PO TABS
600.0000 mg | ORAL_TABLET | Freq: Three times a day (TID) | ORAL | Status: DC | PRN
  Administered 2011-12-08: 600 mg via ORAL
  Filled 2011-12-08: qty 1

## 2011-12-08 MED ORDER — LORAZEPAM 1 MG PO TABS: 1.0000 mg | ORAL_TABLET | Freq: Four times a day (QID) | ORAL | Status: DC | PRN

## 2011-12-08 MED ORDER — POTASSIUM CHLORIDE CRYS ER 20 MEQ PO TBCR
40.0000 meq | EXTENDED_RELEASE_TABLET | Freq: Once | ORAL | Status: AC
  Administered 2011-12-08: 40 meq via ORAL
  Filled 2011-12-08: qty 2

## 2011-12-08 MED ORDER — ZOLPIDEM TARTRATE 5 MG PO TABS
5.0000 mg | ORAL_TABLET | Freq: Every evening | ORAL | Status: DC | PRN
Start: 2011-12-08 — End: 2011-12-09

## 2011-12-08 MED ORDER — ONDANSETRON HCL 4 MG PO TABS
4.0000 mg | ORAL_TABLET | Freq: Three times a day (TID) | ORAL | Status: DC | PRN
Start: 2011-12-08 — End: 2011-12-09

## 2011-12-08 MED ORDER — LORAZEPAM 1 MG PO TABS: 0.0000 mg | ORAL_TABLET | Freq: Four times a day (QID) | ORAL | Status: DC

## 2011-12-08 MED ORDER — VITAMIN B-1 100 MG PO TABS
100.0000 mg | ORAL_TABLET | Freq: Every day | ORAL | Status: DC
  Administered 2011-12-08: 100 mg via ORAL
  Filled 2011-12-08: qty 1

## 2011-12-08 NOTE — ED Notes (Addendum)
Telepsych info called and faxed to [facility]. Waiting for call back from psychiatrist.

## 2011-12-08 NOTE — ED Notes (Signed)
Charge RN, Gillis Ends, aware of SI.

## 2011-12-08 NOTE — ED Notes (Signed)
Pt admitted to psych ED requesting detox from ETOH.  Reports drinking approximately six beers daily for one year. Denies other SA.  She also endorses SI with plan to cut wrists. States stressors are loneliness, financial, and job related. Denies A/V H.  Pt oriented to unit. Verbalilizes understanding. Will cont to monitor.

## 2011-12-08 NOTE — ED Notes (Signed)
PA at bedside.

## 2011-12-08 NOTE — ED Provider Notes (Addendum)
7:15 PM patient alert Glasgow Coma Score 15 patient feeling suicidal has history of long-standing depression and alcohol abuse. Patient pleasant alert cooperative continues to feel suicidal..  Doug Sou, MD 12/08/11 1916 Tele-psychiatry consult reviewed. We'll start Abilify and Celexa. Patient does not need involuntary commitment presently as she is voluntary. Alcohol withdrawal protocol started  Doug Sou, MD 12/08/11 2241

## 2011-12-08 NOTE — ED Provider Notes (Signed)
History     CSN: [id number]  Arrival date & time 12/08/11  1733   First MD Initiated Contact with Patient 12/08/11 1824      Chief Complaint  Patient presents with  . V70.1  . Alcohol Intoxication    (Consider location/radiation/quality/duration/timing/severity/associated sxs/prior treatment) HPI 56 year old female with past medical history of hypertension, depression, prior suicide attempt presents to the emergency department with a chief complaint of suicidal ideation and alcohol abuse with request for assistance with both issues. Long-standing alcohol abuse with multiple prior attempts at detox. Has never had a seizure while withdrawing from alcohol. Daily alcohol use, last intake today. Denies other recreational substance use.   Last seen for psychiatric issues in January 2013 in the emergency department; there was no inpatient treatment at that time. Last inpatient treatment August 2012 at behavioral [facility] hospital and patient feels this is helpful to not continue following up as directed.  Reports suicidal plan. Although nursing note reports plan to cut wrists, pt reports she would like to go in a "calm" way and would overdose on pills.  Denies physical complaints.   Past Medical History  Diagnosis Date  . Hypertension   . Depression   . Suicidal overdose     Past Surgical History  Procedure Date  . Fracture surgery     History reviewed. No pertinent family history.  History  Substance Use Topics  . Smoking status: Current Everyday Smoker -- 1.0 packs/day    Types: Cigarettes  . Smokeless tobacco: Never Used  . Alcohol Use: Yes     daily     Review of Systems 10 systems reviewed and are negative for acute change except as noted in the HPI.  Allergies  Review of patient's allergies indicates no known allergies.  Home Medications   Current Outpatient Rx  Name Route Sig Dispense Refill  . AMLODIPINE BESYLATE 10 MG PO TABS Oral Take 10 mg by mouth daily.      Marland Kitchen CITALOPRAM HYDROBROMIDE 20 MG PO TABS Oral Take 20 mg by mouth daily.    Marland Kitchen SIMVASTATIN 20 MG PO TABS Oral Take 20 mg by mouth at bedtime.      BP 111/76  Pulse 89  Temp(Src) 98.9 F (37.2 C) (Oral)  Resp 20  SpO2 95%  Physical Exam  Nursing note reviewed. Constitutional: She appears well-developed and well-nourished. No distress.       Vital signs are reviewed and are normal.   HENT:  Head: Normocephalic and atraumatic.       MMM  Eyes: Pupils are equal, round, and reactive to light.  Neck: Neck supple.  Cardiovascular: Normal rate, regular rhythm and normal heart sounds.   No murmur heard. Pulmonary/Chest: Effort normal and breath sounds normal. No respiratory distress. She has no wheezes. She has no rales. She exhibits no tenderness.  Abdominal: Soft. Bowel sounds are normal. She exhibits no distension. There is no tenderness.  Musculoskeletal: She exhibits no edema.  Neurological: She is alert.  Skin: Skin is warm and dry.  Psychiatric: Her speech is normal and behavior is normal. She is not actively hallucinating. Thought content is not paranoid and not delusional. She exhibits a depressed mood. She expresses suicidal ideation. She expresses no homicidal ideation. She expresses suicidal plans.    ED Course  Procedures (including critical care time)  Labs Reviewed  COMPREHENSIVE METABOLIC PANEL - Abnormal; Notable for the following:    Potassium 3.1 (*)    All other components within normal limits  ETHANOL - Abnormal; Notable for the following:    Alcohol, Ethyl (B) 207 (*)    All other components within normal limits  CBC  DIFFERENTIAL  URINE RAPID DRUG SCREEN (HOSP PERFORMED)  ACETAMINOPHEN LEVEL   No results found.     MDM  Alcohol abuse, requesting detox. SI with plan. No physical complaints. Benign PE. Labs demonstrate alcohol intoxication, hypokalemia. PO potassium given in ED. ACT team consulted, will assess.    Telepsych pending.    Shaaron Adler, PA-C 12/08/11 [address] [place], [place] 12/08/11 2022

## 2011-12-08 NOTE — ED Notes (Signed)
MD at bedside. 
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
## 2011-12-08 NOTE — ED Notes (Signed)
Pt reports increase in depressive feelings with relationship issues so drank about 9 beers today and began having worsening feelings of SI with plan to cut wrists so called 911. Pt brought in by [employer] office voluntarily, remains tearful at times but calm and cooperative.

## 2011-12-08 NOTE — ED Notes (Signed)
Pt assisted to Psych ED, room #39 with 1 bag of personal belongings accompanied by this nurse, pt's pocketbook was given to pt's mother, condition stable at time of transfer.

## 2011-12-08 NOTE — ED Notes (Signed)
This nurse called lab to check on status of results, was told that results were running now.

## 2011-12-08 NOTE — ED Notes (Signed)
Per [facility], pt called 911, upon arrival to pt's residence, pt was tearful and intoxicated with reports of SI so pt transported here voluntarily.

## 2011-12-09 ENCOUNTER — Inpatient Hospital Stay (HOSPITAL_COMMUNITY): Admission: AD | Admit: 2011-12-09 | Payer: 59 | Source: Other Acute Inpatient Hospital | Admitting: Psychiatry

## 2011-12-09 ENCOUNTER — Encounter (HOSPITAL_COMMUNITY): Payer: Self-pay | Admitting: *Deleted

## 2011-12-09 ENCOUNTER — Inpatient Hospital Stay (HOSPITAL_COMMUNITY)
Admission: AD | Admit: 2011-12-09 | Discharge: 2011-12-12 | DRG: 897 | Disposition: A | Discharge status: Home / Self Care | Payer: 59 | Source: Other Acute Inpatient Hospital | Attending: Psychiatry | Admitting: Psychiatry

## 2011-12-09 DIAGNOSIS — E785 Hyperlipidemia, unspecified: Secondary | ICD-10-CM | POA: Diagnosis present

## 2011-12-09 DIAGNOSIS — F17200 Nicotine dependence, unspecified, uncomplicated: Secondary | ICD-10-CM | POA: Diagnosis present

## 2011-12-09 DIAGNOSIS — E876 Hypokalemia: Secondary | ICD-10-CM | POA: Diagnosis present

## 2011-12-09 DIAGNOSIS — F10988 Alcohol use, unspecified with other alcohol-induced disorder: Principal | ICD-10-CM | POA: Diagnosis present

## 2011-12-09 DIAGNOSIS — F1994 Other psychoactive substance use, unspecified with psychoactive substance-induced mood disorder: Secondary | ICD-10-CM | POA: Diagnosis present

## 2011-12-09 DIAGNOSIS — I10 Essential (primary) hypertension: Secondary | ICD-10-CM | POA: Diagnosis present

## 2011-12-09 DIAGNOSIS — Z79899 Other long term (current) drug therapy: Secondary | ICD-10-CM

## 2011-12-09 DIAGNOSIS — F1020 Alcohol dependence, uncomplicated: Secondary | ICD-10-CM | POA: Diagnosis present

## 2011-12-09 LAB — COMPREHENSIVE METABOLIC PANEL
CO2: 28 mEq/L (ref 19–32)
Calcium: 9 mg/dL (ref 8.4–10.5)
Creatinine, Ser: 0.6 mg/dL (ref 0.50–1.10)
GFR calc Af Amer: >90 mL/min (ref >90)
GFR calc non Af Amer: >90 mL/min (ref >90)
Glucose, Bld: 103 mg/dL — ABNORMAL HIGH (ref 70–99)
Sodium: 140 mEq/L (ref 135–145)
Total Protein: 7.5 g/dL (ref 6.0–8.3)

## 2011-12-09 MED ORDER — NICOTINE 21 MG/24HR TD PT24
21.0000 mg | MEDICATED_PATCH | Freq: Every day | TRANSDERMAL | Status: DC
  Administered 2011-12-09 – 2011-12-12 (×4): 21 mg via TRANSDERMAL
  Filled 2011-12-09 (×7): qty 1

## 2011-12-09 MED ORDER — VITAMIN B-1 100 MG PO TABS: 100.0000 mg | ORAL_TABLET | Freq: Every day | ORAL | Status: DC

## 2011-12-09 MED ORDER — CITALOPRAM HYDROBROMIDE 20 MG PO TABS
30.0000 mg | ORAL_TABLET | Freq: Every day | ORAL | Status: DC
  Administered 2011-12-10 – 2011-12-12 (×3): 30 mg via ORAL
  Filled 2011-12-09 (×5): qty 1

## 2011-12-09 MED ORDER — ACETAMINOPHEN 325 MG PO TABS: 650.0000 mg | ORAL_TABLET | Freq: Four times a day (QID) | ORAL | Status: DC | PRN

## 2011-12-09 MED ORDER — ADULT MULTIVITAMIN W/MINERALS CH: 1.0000 | ORAL_TABLET | Freq: Every day | ORAL | Status: DC

## 2011-12-09 MED ORDER — CHLORDIAZEPOXIDE HCL 25 MG PO CAPS
25.0000 mg | ORAL_CAPSULE | Freq: Four times a day (QID) | ORAL | Status: AC | PRN
  Filled 2011-12-09: qty 1

## 2011-12-09 MED ORDER — VITAMIN B-1 100 MG PO TABS
100.0000 mg | ORAL_TABLET | Freq: Every day | ORAL | Status: DC
  Administered 2011-12-10 – 2011-12-12 (×3): 100 mg via ORAL
  Filled 2011-12-09 (×5): qty 1

## 2011-12-09 MED ORDER — HYDROXYZINE HCL 25 MG PO TABS: 25.0000 mg | ORAL_TABLET | Freq: Four times a day (QID) | ORAL | Status: AC | PRN

## 2011-12-09 MED ORDER — THIAMINE HCL 100 MG/ML IJ SOLN: 100.0000 mg | Freq: Once | INTRAMUSCULAR | Status: DC

## 2011-12-09 MED ORDER — SIMVASTATIN 20 MG PO TABS
20.0000 mg | ORAL_TABLET | Freq: Every day | ORAL | Status: DC
  Administered 2011-12-09 – 2011-12-11 (×3): 20 mg via ORAL
  Filled 2011-12-09: qty 14
  Filled 2011-12-09 (×4): qty 1

## 2011-12-09 MED ORDER — AMLODIPINE BESYLATE 10 MG PO TABS
10.0000 mg | ORAL_TABLET | Freq: Every day | ORAL | Status: DC
  Administered 2011-12-09 – 2011-12-12 (×4): 10 mg via ORAL
  Filled 2011-12-09 (×3): qty 1
  Filled 2011-12-09: qty 14
  Filled 2011-12-09 (×2): qty 1

## 2011-12-09 MED ORDER — CHLORDIAZEPOXIDE HCL 25 MG PO CAPS
25.0000 mg | ORAL_CAPSULE | Freq: Every day | ORAL | Status: AC
  Administered 2011-12-12: 25 mg via ORAL
  Filled 2011-12-09: qty 1

## 2011-12-09 MED ORDER — CHLORDIAZEPOXIDE HCL 25 MG PO CAPS
25.0000 mg | ORAL_CAPSULE | Freq: Three times a day (TID) | ORAL | Status: AC
  Administered 2011-12-10 (×3): 25 mg via ORAL
  Filled 2011-12-09 (×2): qty 1

## 2011-12-09 MED ORDER — MAGNESIUM HYDROXIDE 400 MG/5ML PO SUSP: 30.0000 mL | Freq: Every day | ORAL | Status: DC | PRN

## 2011-12-09 MED ORDER — ONDANSETRON 4 MG PO TBDP: 4.0000 mg | ORAL_TABLET | Freq: Four times a day (QID) | ORAL | Status: DC | PRN

## 2011-12-09 MED ORDER — ADULT MULTIVITAMIN W/MINERALS CH
1.0000 | ORAL_TABLET | Freq: Every day | ORAL | Status: DC
  Administered 2011-12-09 – 2011-12-12 (×3): 1 via ORAL
  Filled 2011-12-09 (×6): qty 1

## 2011-12-09 MED ORDER — CHLORDIAZEPOXIDE HCL 25 MG PO CAPS
25.0000 mg | ORAL_CAPSULE | Freq: Four times a day (QID) | ORAL | Status: AC
  Administered 2011-12-09 (×3): 25 mg via ORAL
  Filled 2011-12-09 (×3): qty 1

## 2011-12-09 MED ORDER — POTASSIUM CHLORIDE CRYS ER 20 MEQ PO TBCR
20.0000 meq | EXTENDED_RELEASE_TABLET | ORAL | Status: AC
  Administered 2011-12-09 – 2011-12-12 (×6): 20 meq via ORAL
  Filled 2011-12-09 (×6): qty 1

## 2011-12-09 MED ORDER — CHLORDIAZEPOXIDE HCL 25 MG PO CAPS
25.0000 mg | ORAL_CAPSULE | ORAL | Status: AC
  Administered 2011-12-11 (×2): 25 mg via ORAL
  Filled 2011-12-09 (×2): qty 1

## 2011-12-09 MED ORDER — CHLORDIAZEPOXIDE HCL 25 MG PO CAPS: 25.0000 mg | ORAL_CAPSULE | Freq: Four times a day (QID) | ORAL | Status: DC | PRN

## 2011-12-09 MED ORDER — ALUM & MAG HYDROXIDE-SIMETH 200-200-20 MG/5ML PO SUSP: 30.0000 mL | ORAL | Status: DC | PRN

## 2011-12-09 MED ORDER — HYDROXYZINE HCL 25 MG PO TABS: 25.0000 mg | ORAL_TABLET | Freq: Four times a day (QID) | ORAL | Status: DC | PRN

## 2011-12-09 MED ORDER — ONDANSETRON 4 MG PO TBDP: 4.0000 mg | ORAL_TABLET | Freq: Four times a day (QID) | ORAL | Status: AC | PRN

## 2011-12-09 MED ORDER — LOPERAMIDE HCL 2 MG PO CAPS: 2.0000 mg | ORAL_CAPSULE | ORAL | Status: AC | PRN

## 2011-12-09 MED ORDER — TRAZODONE HCL 50 MG PO TABS
50.0000 mg | ORAL_TABLET | Freq: Every evening | ORAL | Status: DC | PRN
  Filled 2011-12-09: qty 14

## 2011-12-09 MED ORDER — LOPERAMIDE HCL 2 MG PO CAPS: 2.0000 mg | ORAL_CAPSULE | ORAL | Status: DC | PRN

## 2011-12-09 MED ORDER — CITALOPRAM HYDROBROMIDE 20 MG PO TABS
20.0000 mg | ORAL_TABLET | Freq: Every day | ORAL | Status: DC
  Administered 2011-12-09: 20 mg via ORAL
  Filled 2011-12-09 (×3): qty 1

## 2011-12-09 NOTE — H&P (Signed)
Psychiatric Admission Assessment Adult  Patient Identification:  [name] Date of Evaluation:  12/09/2011 Chief Complaint:  Substance Induced Depressive Disorder Alcohol Abuse History of Present Illness: This is a voluntary admission for [name] who notes that she has relapsed and is now drinking 6 beers a day during the week and 12 a day on the weekends.  Prior to coming in she notes that she was sleeping well, but her appetite was decreased, she rates her depression as a 7-8/10, today she denies suicidal ideation, but relates that she always gets more depressed when she is drinking.  She denies anxiety and reports no AH/VH.   She rates her hopelessness as a 3/10.  She reports no symptoms of withdrawal. She does relate that she took several Celexa and drank last week in an overdose attempt but did not go tot the hospital.  Past Psychiatric History: [facility] Diagnosis:detox  Hospitalizations: 01/2011  Outpatient Care: no psychiatrist.  Substance Abuse Care:  Self-Mutilation:  Suicidal Attempts: see above  Violent Behaviors:   Past Medical History:   Past Medical History  Diagnosis Date  . Hypertension   . Depression   . Suicidal overdose     Allergies:  No Known Allergies PTA Medications: Prescriptions prior to admission  Medication Sig Dispense Refill  . amLODipine (NORVASC) 10 MG tablet Take 10 mg by mouth daily.      . citalopram (CELEXA) 20 MG tablet Take 20 mg by mouth daily.      . simvastatin (ZOCOR) 20 MG tablet Take 20 mg by mouth at bedtime.        Previous Psychotropic Medications:  Medication/Dose                 Substance abuse in the past 12 months:  See HPI  Consequences of Substance Abuse: none  Social History: Current Place of Residence:   Place of Birth:   Family Members: Marital Status:  Divorced Children:  Sons:  Daughters: Relationships: Education:  10th grade Educational Problems/Performance: Religious Beliefs/Practices: History of  Abuse (Emotional/Phsycial/Sexual) Occupational Experiences; Military History:  None. Legal History: Hobbies/Interests:  Family History:  No family history on file. ROS: Negative with the exception of the HPI PE: Completed by MD in ED. I have reviewed those findings and agree with them. Mental Status Examination/Evaluation: Objective:  Appearance: Casual  Eye Contact::  Good  Speech:  Clear and Coherent  Volume:  Normal  Mood:  Anxious  Affect:  Congruent  Thought Process:  Linear  Orientation:  Full  Thought Content:  WDL  Suicidal Thoughts:  No  Homicidal Thoughts:  No  Memory:  Immediate;   Fair  Judgement:  Fair  Insight:  Fair  Psychomotor Activity:  Normal  Concentration:  Fair  Recall:  Fair  Akathisia:  No  Handed:    AIMS (if indicated):     Assets:  Communication Skills Desire for Improvement Housing Physical Health Social Support Talents/Skills Transportation Vocational/Educational  Sleep:  Number of Hours: 1.75     Laboratory/X-Ray Psychological Evaluation(s)  Results for [name] (MRN [id number]) as of 12/09/2011 10:17  Ref. Range 12/08/2011 18:01  Sodium Latest Range: 135-145 mEq/L 136  Potassium Latest Range: 3.5-5.1 mEq/L 3.1 (L)  Chloride Latest Range: 96-112 mEq/L 99  CO2 Latest Range: 19-32 mEq/L 21  BUN Latest Range: 6-23 mg/dL 11  Creat Latest Range: 0.50-1.10 mg/dL 2.44  Calcium Latest Range: 8.4-10.5 mg/dL 9.2  GFR calc non Af Amer Latest Range: >90 mL/min >90  GFR  calc Af Amer Latest Range: >90 mL/min >90  Glucose Latest Range: 70-99 mg/dL 88  Alkaline Phosphatase Latest Range: 39-117 U/L 101  Albumin Latest Range: 3.5-5.2 g/dL 4.5  AST Latest Range: 0-37 U/L 23  ALT Latest Range: 0-35 U/L 31  Total Protein Latest Range: 6.0-8.3 g/dL 7.8  Total Bilirubin Latest Range: 0.3-1.2 mg/dL 0.3  WBC Latest Range: 4.0-10.5 K/uL 5.2  RBC Latest Range: 3.87-5.11 MIL/uL 4.83  Hemoglobin Latest Range: 12.0-15.0 g/dL 16.1  HCT Latest Range:  36.0-46.0 % 41.9  MCV Latest Range: 78.0-100.0 fL 86.7  MCH Latest Range: 26.0-34.0 pg 30.4  MCHC Latest Range: 30.0-36.0 g/dL 09.6  RDW Latest Range: 11.5-15.5 % 12.6  Platelets Latest Range: 150-400 K/uL 334  Neutrophils Relative Latest Range: 43-77 % 52  Lymphocytes Relative Latest Range: 12-46 % 40  Monocytes Relative Latest Range: 3-12 % 6  Eosinophils Relative Latest Range: 0-5 % 1  Basophils Relative Latest Range: 0-1 % 1  NEUT# Latest Range: 1.7-7.7 K/uL 2.7  Lymphocytes Absolute Latest Range: 0.7-4.0 K/uL 2.1  Monocytes Absolute Latest Range: 0.1-1.0 K/uL 0.3  Eosinophils Absolute Latest Range: 0.0-0.7 K/uL 0.1  Basophils Absolute Latest Range: 0.0-0.1 K/uL 0.0  Acetaminophen (Tylenol), Serum Latest Range: 10-30 ug/mL <15.0  Alcohol, Ethyl (B) Latest Range: 0-11 mg/dL 045 (H)      Assessment:    AXIS I:  Alcohol dependence, SIMDO AXIS II:  Deferred AXIS III:   Past Medical History  Diagnosis Date  . Hypertension   . Depression   . Suicidal overdose    AXIS IV:  problems with primary support group AXIS V:  51-60 moderate symptoms  Treatment Plan/Recommendations:  Admit for crisis management, detox and stabilization.  Treatment Plan Summary: Daily contact with patient to assess and evaluate symptoms and progress in treatment Medication management 1. Librium detox protocol as indicated. 2. Restart home medications. 3. Treat health related issues as needed. 4. Encourage follow up treatment upon completion of detox to support sobriety. Current Medications:  Current Facility-Administered Medications  Medication Dose Route Frequency Provider Last Rate Last Dose  . acetaminophen (TYLENOL) tablet 650 mg  650 mg Oral Q6H PRN Mike Craze, MD      . alum & mag hydroxide-simeth (MAALOX/MYLANTA) 200-200-20 MG/5ML suspension 30 mL  30 mL Oral Q4H PRN Mike Craze, MD      . amLODipine (NORVASC) tablet 10 mg  10 mg Oral Daily Mike Craze, MD   10 mg at 12/09/11 0843    . chlordiazePOXIDE (LIBRIUM) capsule 25 mg  25 mg Oral Q6H PRN Jorje Guild, PA-C      . citalopram (CELEXA) tablet 20 mg  20 mg Oral Daily Mike Craze, MD   20 mg at 12/09/11 0843  . hydrOXYzine (ATARAX/VISTARIL) tablet 25 mg  25 mg Oral Q6H PRN Jorje Guild, PA-C      . loperamide (IMODIUM) capsule 2-4 mg  2-4 mg Oral PRN Jorje Guild, PA-C      . magnesium hydroxide (MILK OF MAGNESIA) suspension 30 mL  30 mL Oral Daily PRN Mike Craze, MD      . multivitamin with minerals tablet 1 tablet  1 tablet Oral Daily Jorje Guild, PA-C   1 tablet at 12/09/11 0843  . nicotine (NICODERM CQ - dosed in mg/24 hours) patch 21 mg  21 mg Transdermal Daily Mike Craze, MD   21 mg at 12/09/11 0630  . ondansetron (ZOFRAN-ODT) disintegrating tablet 4 mg  4 mg Oral Q6H PRN Hessie Diener  Watt, PA-C      . thiamine (B-1) injection 100 mg  100 mg Intramuscular Once Jorje Guild, PA-C      . thiamine (VITAMIN B-1) tablet 100 mg  100 mg Oral Daily Jorje Guild, PA-C      . traZODone (DESYREL) tablet 50 mg  50 mg Oral QHS PRN Mike Craze, MD       Facility-Administered Medications Ordered in Other Encounters  Medication Dose Route Frequency Provider Last Rate Last Dose  . potassium chloride SA (K-DUR,KLOR-CON) CR tablet 40 mEq  40 mEq Oral Once Shaaron Adler, PA-C   40 mEq at 12/08/11 1911  . DISCONTD: alum & mag hydroxide-simeth (MAALOX/MYLANTA) 200-200-20 MG/5ML suspension 30 mL  30 mL Oral PRN Shaaron Adler, PA-C      . DISCONTD: ARIPiprazole (ABILIFY) tablet 2 mg  2 mg Oral Daily Doug Sou, MD      . DISCONTD: citalopram (CELEXA) tablet 20 mg  20 mg Oral Daily Doug Sou, MD      . DISCONTD: folic acid (FOLVITE) tablet 1 mg  1 mg Oral Daily Shaaron Adler, PA-C   1 mg at 12/08/11 2033  . DISCONTD: ibuprofen (ADVIL,MOTRIN) tablet 600 mg  600 mg Oral Q8H PRN Shaaron Adler, PA-C   600 mg at 12/08/11 2158  . DISCONTD: LORazepam (ATIVAN) injection 1 mg  1 mg Intravenous Q6H PRN Shaaron Adler, PA-C      . DISCONTD: LORazepam (ATIVAN) tablet 0-4 mg  0-4 mg Oral Q6H Stephanie Justice Chemult, PA-C      . DISCONTD: LORazepam (ATIVAN) tablet 0-4 mg  0-4 mg Oral Q12H Shaaron Adler, PA-C      . DISCONTD: LORazepam (ATIVAN) tablet 1 mg  1 mg Oral Q6H PRN Shaaron Adler, PA-C      . DISCONTD: multivitamin with minerals tablet 1 tablet  1 tablet Oral Daily Shaaron Adler, PA-C   1 tablet at 12/08/11 2033  . DISCONTD: nicotine (NICODERM CQ - dosed in mg/24 hours) patch 21 mg  21 mg Transdermal Daily Shaaron Adler, PA-C   21 mg at 12/08/11 2037  . DISCONTD: ondansetron (ZOFRAN) tablet 4 mg  4 mg Oral Q8H PRN Shaaron Adler, PA-C      . DISCONTD: thiamine (B-1) injection 100 mg  100 mg Intravenous Daily Shaaron Adler, [place]      . DISCONTD: thiamine (VITAMIN B-1) tablet 100 mg  100 mg Oral Daily Shaaron Adler, PA-C   100 mg at 12/08/11 2034  . DISCONTD: zolpidem (AMBIEN) tablet 5 mg  5 mg Oral QHS PRN Shaaron Adler, PA-C        Observation Level/Precautions:  Detox  Laboratory:    Psychotherapy:    Medications:    Routine PRN Medications:  Yes  Consultations:    Discharge Concerns:    Other:     Lloyd Huger T. Finn Amos PAC For Dr. Harvie Heck D Readling 6/10/20139:27 AM

## 2011-12-09 NOTE — Progress Notes (Signed)
Adult Psychosocial Assessment Update Interdisciplinary Team  Previous [facility] admissions/discharges:  Admissions Discharges  Date:  01/31/11 Date: 02/05/11  Date: Date:  Date: Date:  Date: Date:  Date: Date:   Changes since the last Psychosocial Assessment (including adherence to outpatient mental health and/or substance abuse treatment, situational issues contributing to decompensation and/or relapse).  Patient was discharged 02/05/11 from [facility] after overdose on celexa and and alcohol;    attended one counseling session with Doristine Locks and atteded on AA meeting.    States she cannot afford the $75 per visit counseling and did not like the one AA    meeting she attended.  Lagretta continues to be employed at her job of 23 years, lives    alone and has noticed she is isolating more. Drinking began again 2-3 weeks after     Aug discharge. 6 on weeknight and 12 on weekend nights.Currently stopped      Antidepressant (prescribed by PCP) as "I am out of the prescription"     Discharge Plan 1. Will you be returning to the same living situation after discharge?   Yes: X No:      If no, what is your plan?      Lives alone, has transportation       2. Would you like a referral for services when you are discharged? Yes:  X   If yes, for what services?  No:         Would consider therapist if affordable (previous referral was $75 out of pocket)          Summary and Recommendations (to be completed by the evaluator)   Patient is 56 YO employed Caucasian female admitted with Depressive Disorder NOS    And Alcohol Abuse.  Patient was admitted for same in August 2012 after an overdose.    Patient currently admits to depressive thoughts and increased suicidal ideation when     drinking. Sheniah will benefit from crisis stabilization, medication evaluation, group .    therapy and psycho ed groups, in addition to case management for discharge planning               Signature:   [name], 54/03/2012 8:39 AM

## 2011-12-09 NOTE — Progress Notes (Signed)
Patient ID: [name], female   DOB: 14-Jul-1955, 56 y.o.   MRN: [id number] Pt. Reports here for detox. Says drinking results from job stressor. Pt. Wants to stop drinking so she can be more productive at work and pt. Has just begun to date again. Pt. Says she meet this nice guy and he doesn't drink so she wants this relationship to last. Staff will monitor q36min for safety.

## 2011-12-09 NOTE — ED Provider Notes (Signed)
Medical screening examination/treatment/procedure(s) were conducted as a shared visit with non-physician practitioner(s) and myself.  I personally evaluated the patient during the encounter  Doug Sou, MD 12/09/11 0028

## 2011-12-09 NOTE — Tx Team (Signed)
Initial Interdisciplinary Treatment Plan  PATIENT STRENGTHS: (choose at least two) Ability for insight Active sense of humor Average or above average intelligence Capable of independent living Communication skills General fund of knowledge Motivation for treatment/growth Physical Health Supportive family/friends Work skills  PATIENT STRESSORS: Medication change or noncompliance Substance abuse   PROBLEM LIST: Problem List/Patient Goals Date to be addressed Date deferred Reason deferred Estimated date of resolution  SI      Depression      Substance abuse-ETOH                                           DISCHARGE CRITERIA:  Ability to meet basic life and health needs Adequate post-discharge living arrangements Improved stabilization in mood, thinking, and/or behavior Medical problems require only outpatient monitoring Motivation to continue treatment in a less acute level of care Need for constant or close observation no longer present Reduction of life-threatening or endangering symptoms to within safe limits Safe-care adequate arrangements made Verbal commitment to aftercare and medication compliance Withdrawal symptoms are absent or subacute and managed without 24-hour nursing intervention  PRELIMINARY DISCHARGE PLAN: Attend 12-step recovery group Outpatient therapy Return to previous living arrangement Return to previous work or school arrangements  PATIENT/FAMIILY INVOLVEMENT: This treatment plan has been presented to and reviewed with the patient, [name].  The patient has been given the opportunity to ask questions and make suggestions.  Arturo Morton 12/09/2011, 3:00 AM

## 2011-12-09 NOTE — Progress Notes (Signed)
Pt attended discharge planning group and actively participated.  Pt presents with flat affect and depressed mood.  Pt rates depression and anxiety at a 2 today.  Pt was open with sharing reason for entering the hospital.  Pt states that she was depressed and began drinking more heavily.  Pt states that she was here in August, did well for a month but started drinking again.  Pt states that she works and is not open to going to rehab.  Pt states that she lives alone in [facility] [place] and has transportation.  Pt states that she doesn't have a psychiatrist or therapist but would be open to a referral.  Pt states that she also plans ot follow up with AA meetings.  SW will make appropriate referrals.  No further needs voiced by pt at this time.  Safety planning and suicide prevention discussed.     Reyes Ivan, LCSWA 12/09/2011  10:26 AM

## 2011-12-09 NOTE — Tx Team (Signed)
Interdisciplinary Treatment Plan Update (Adult)  Date:  12/09/2011  Time Reviewed:  11:36 AM   Progress in Treatment: Attending groups: Yes Participating in groups:  Yes Taking medication as prescribed: Yes Tolerating medication:  Yes Family/Significant othe contact made:  Counselor will assess for appropriate contact Patient understands diagnosis:  Yes Discussing patient identified problems/goals with staff:  Yes Medical problems stabilized or resolved:  Yes Denies suicidal/homicidal ideation: Yes Issues/concerns per patient self-inventory:  None identified Other: N/A  New problem(s) identified: None Identified  Reason for Continuation of Hospitalization: Anxiety Depression Medication stabilization Withdrawal symptoms   Interventions implemented related to continuation of hospitalization: mood stabilization, medication monitoring and adjustment, group therapy and psycho education, safety checks q 15 mins  Additional comments: N/A  Estimated length of stay: 3-5 days  Discharge Plan: SW is assessing for appropriate referrals.   New goal(s): N/A  Review of initial/current patient goals per problem list:   1.  Goal(s): Reduce depressive and anxiety symptoms  Met:  No  Target date: by discharge  As evidenced by: Reducing depression from a 10 to a 3 as reported by pt.   2.  Goal (s): Eliminate Suicidal Ideation  Met:  No  Target date: by discharge  As evidenced by: Eliminate suicidal ideation.   3.  Goal(s): Address substance use  Met:  No  Target date: by discharge  As evidenced by: Completing detox protocol and refer to appropriate treatment.    Attendees: [name] 12/09/2011 11:38 AM   Family:     Physician:  Franchot Gallo, MD 12/09/2011 11:36 AM   Nursing:   Omelia Blackwater, RN 12/09/2011 11:38 AM   Case Manager:  Reyes Ivan, LCSWA 12/09/2011 11:36 AM   Counselor:  Veto Kemps, MT-BC 12/09/2011 11:36 AM   Other:  Ambrose Mantle,  LCSW 12/09/2011 11:36 AM   Other:     Other:     Other:      Scribe for Treatment Team:   Carmina Miller, 12/09/2011, 11:36 AM

## 2011-12-09 NOTE — Progress Notes (Signed)
56yo female who presents voluntarily and in no acute distress for the treatment of SI, Depression and ETOH abuse. Appears flat and depressed. Calm and cooperative with assessment. States she was last here in AUG 2012 with similar problems. States when she left she did ok, but starting drinking occasionally (feeling depressed and lonely) and has slowly relapsed to a daily drinker. States she drinks 6-7 beers on the weekdays and a 12/day on the weekends. States she has found "someone special" (who doesn't drink) and she feels like she is screwing everything up with her ETOH abuse. Support and encouragement provided. Denies any drug abuse. Is a smoker and nicotine patch offered and accepted. States she would like to quit smoking. See detailed assessment for greater medical detail. Denies pain. Skin assessed by Marisue Ivan, RN and found to be clear apart from a tattoo on her right hip. Has had self harm thoughts recently but had no plan. Currently denies SI/HI/AVH and contracts for safety. POC, unit policies and expectations reviewed and understanding verbalized. Consents obbtained. Belongings searched by Leotis Shames, MHT and no contraband found. Food and fluids offered, fluids accepted. No additional questions or concerns expressed. Escorted to unit by Lauren, MHT and oriented to unit and room.  Emergency Contact: Eileen Stanford [phone]

## 2011-12-09 NOTE — [facility] Counselor (Signed)
[facility] has accepted pt. [name] to Dan Humphreys MD, bed 500-2. EDP and RN notified. All support paperwork faxed to [facility] and originals placed in pt's chart.

## 2011-12-09 NOTE — [facility] Suicide Risk Assessment (Signed)
Suicide Risk Assessment  Admission Assessment     Demographic factors:  Assessment Details Time of Assessment: Admission Information Obtained From: Patient Current Mental Status:  Current Mental Status:  (denies SI currently) Loss Factors:  Loss Factors: Financial problems / change in socioeconomic status (substance abuse) Historical Factors:  Historical Factors: Prior suicide attempts;Family history of mental illness or substance abuse;Impulsivity Risk Reduction Factors:  Risk Reduction Factors: Employed;Positive social support  CLINICAL FACTORS:   Depression:   Comorbid alcohol abuse/dependence Alcohol/Substance Abuse/Dependencies More than one psychiatric diagnosis Previous Psychiatric Diagnoses and Treatments  COGNITIVE FEATURES THAT CONTRIBUTE TO RISK:  None Noted.    Diagnosis:  Axis I:  Alcohol Dependence.   Substance Induced Mood Disorder.  The patient was seen today and reports the following:   ADL's: Intact.  Sleep: The patient reports to sleeping well last night.  Appetite: The patient reports that her appetite is decreased.   Mild>(1-10) >Severe  Hopelessness (1-10): 0  Depression (1-10): 3-4  Anxiety (1-10): 1-2   Suicidal Ideation: The patient adamantly denies any suicidal ideations today.  Plan: No  Intent: No  Means: No   Homicidal Ideation: The patient adamantly denies any homicidal ideations today.  Plan: No  Intent: No.  Means: No   General Appearance/Behavior: The patient was friendly and cooperative today with this provider.  Eye Contact: Good.  Speech: Appropriate in rate and volume with no pressuring noted today.  Motor Behavior: wnl.  Level of Consciousness: Alert and Oriented x 3.  Mental Status: Alert and Oriented x 3.  Mood: Appears mild to moderately depressed today.  Affect: Appears mild to moderately constricted.  Anxiety Level: Mild anxiety reported today.  Thought Process:  wnl.  Thought Content: The patient denies any auditory  or visual hallucinations today as well as any delusional thinking.  Perception: wnl.  Judgment: Good.  Insight: Good.  Cognition: Oriented to person, place and time.   Lab Results: No results found for this or any previous visit (from the past 48 hour(s)).   Review of Systems:  Neurological: The patient denies any headaches today. She denies any seizures or dizziness.  G.I.: The patient denies any constipation or G.I. Upset today.  Musculoskeletal: The patient denies any muscle or skeletal difficulties.   Time was spent today discussing with the patient her current symptoms. The patient reports to sleeping well last night without difficulty and reports a decreased appetite. She reports mild to moderate feelings of sadness, anhedonia and depressed mood and reports mild anxiety symptoms today.  She adamantly denies any auditory or visual hallucinations or delusional thinking. She denies any current symptoms of substance withdrawal.   The patient states that she was admitted for detox from alcohol stating she was drinking 6 to 7 beers per night during the week and 12 beers on weekend nights.  Treatment Plan Summary:  1. Daily contact with patient to assess and evaluate symptoms and progress in treatment.  2. Medication management  3. The patient will deny suicidal ideations or homicidal ideations for 48 hours prior to discharge and have a depression and anxiety rating of 3 or less. The patient will also deny any auditory or visual hallucinations or delusional thinking.  4. The patient will deny any symptoms of substance withdrawal at time of discharge.   Plan:  1. Will continue the medication Celexa at the increased dosage of 30 mgs po q am for depression. 2. Will start the patient on a Librium Detox protocol for detox from her use  of alcohol. 3. Will start the patient on KCL 20 mEq po qam and hs for hypokalemia. 4. Will continue the patient on her non-psychiatric medications for hypertension  and hyperlipidemia. 5. Laboratory studies reviewed.  6. Will order a repeat CMP to re-evaluate the patient's low potassium. 7. Will continue to monitor.   SUICIDE RISK:  Minimal: No identifiable suicidal ideation.  Patients presenting with no risk factors but with morbid ruminations; may be classified as minimal risk based on the severity of the depressive symptoms   Nuno Brubacher 12/09/2011, 2:48 PM

## 2011-12-09 NOTE — Progress Notes (Signed)
Patient pleasant and cooperative upon my approach. Patient moved from 500 hall to 300 hall. Patient denies SI/HI, denies A/V hallucinations. Patient denies symptoms of withdrawal. Patient active in groups, patient states she is having an "okay day today." Patient given support and encouragement. Patient remains safe on unit, Q15 minute checks for safety. Will continue to monitor.

## 2011-12-09 NOTE — Progress Notes (Signed)
Eh is seen first thing this morning on the unit...she is shy, quiet, but pleasant. She took her medications as ordered by MD. She denies SI and  rated her depression and hopelessness " 3 /7 ". POC includes transferring to 300 hall ( for detox) and encouraging participation in gorups. Therapeutic relationship established PD RN [facility]

## 2011-12-09 NOTE — Progress Notes (Signed)
[facility] Group Notes: (Counselor/Nursing/MHT/Case Management/Adjunct) 12/09/2011   @  11:00am Overcoming Obstacles to Wellness   Type of Therapy:  Group Therapy  Participation Level:  Minimal  Participation Quality: Attentive   Affect:   Blunted  Cognitive:  Appropriate  Insight:  None  Engagement in Group: Minimal  Engagement in Therapy:  None  Modes of Intervention:  Support and Exploration  Summary of Progress/Problems: [name]  was attentive but not engaged in group process   Billie Lade 12/09/2011 3:24 PM      [facility] Group Notes: (Counselor/Nursing/MHT/Case Management/Adjunct) 12/09/2011   @1 :15pm Breathing & Meditation for Anxiety/Anger   Type of Therapy:  Group Therapy  Participation Level:  Limited  Participation Quality: Attentive    Affect:  Blunted  Cognitive:  Appropriate  Insight:  None  Engagement in Group: Limited  Engagement in Therapy:  LImited  Modes of Intervention:  Support and Exploration  Summary of Progress/Problems: [name] participated in color breathing and safe place guided imagery meditation. However, she did not process her experience with the rest of the group.   Billie Lade 12/09/2011 3:24 PM

## 2011-12-09 NOTE — ED Notes (Signed)
Patient discharge via ambulatory with steady gait. Respirations equal and unlabored. Skin warm and dry. No acute distress noted. 

## 2011-12-10 LAB — COMPREHENSIVE METABOLIC PANEL
ALT: 27 U/L (ref 0–35)
AST: 20 U/L (ref 0–37)
Albumin: 4.1 g/dL (ref 3.5–5.2)
Alkaline Phosphatase: 109 U/L (ref 39–117)
CO2: 28 mEq/L (ref 19–32)
Chloride: 105 mEq/L (ref 96–112)
GFR calc non Af Amer: >90 mL/min (ref >90)
Potassium: 3.9 mEq/L (ref 3.5–5.1)
Total Bilirubin: 0.2 mg/dL — ABNORMAL LOW (ref 0.3–1.2)

## 2011-12-10 NOTE — Progress Notes (Signed)
Pt attended discharge planning group and actively participated.  Pt presents with calm mood and affect.  Pt denies having depression, anxiety and SI today.  Pt states that an AA meeting held here last night was very helpful for her.  Pt plans to attend AA meetings upon d/c. SW secured pt's follow up with [facility] for medication management and therapy.  SW contacted pt's employer, per pt's request, to inform pt is in the hospital.  No further needs voiced by pt at this time.    Reyes Ivan, LCSWA 12/10/2011  9:40 AM

## 2011-12-10 NOTE — Progress Notes (Signed)
[facility] MD Progress Note  12/10/2011 3:12 PM  S/O: Patient seen and evaluated. Chart reviewed. Patient stated that her mood was "good". Her affect was mood congruent and euthymic. She denied any current thoughts of self injurious behavior, suicidal ideation or homicidal ideation. There were no auditory or visual hallucinations, paranoia, delusional thought processes, or mania noted.  Thought process was linear and goal directed.  No psychomotor agitation or retardation was noted. Speech was normal rate, tone and volume. Eye contact was good. Judgment and insight are fair.  Patient has been up and engaged on the unit.  No acute safety concerns reported from team.  No sig withdrawal s/s noted at this time with use of standard librium taper.  Sleep:  Number of Hours: 6.75    Vital Signs:Blood pressure 106/73, pulse 99, temperature 98.2 F (36.8 C), temperature source Oral, resp. rate 18, height 5' 1.25" (1.556 m), weight 63.05 kg (139 lb).  Current Medications:    . amLODipine  10 mg Oral Daily  . chlordiazePOXIDE  25 mg Oral QID   Followed by  . chlordiazePOXIDE  25 mg Oral TID   Followed by  . chlordiazePOXIDE  25 mg Oral BH-qamhs   Followed by  . chlordiazePOXIDE  25 mg Oral Daily  . citalopram  30 mg Oral Daily  . multivitamin with minerals  1 tablet Oral Daily  . nicotine  21 mg Transdermal Daily  . potassium chloride  20 mEq Oral BH-qamhs  . simvastatin  20 mg Oral QHS  . thiamine  100 mg Intramuscular Once  . thiamine  100 mg Oral Daily    Lab Results:  Results for orders placed during the hospital encounter of 12/09/11 (from the past 48 hour(s))  COMPREHENSIVE METABOLIC PANEL     Status: Abnormal   Collection Time   12/09/11  8:02 PM      Component Value Range Comment   Sodium 140  135 - 145 (mEq/L)    Potassium 3.4 (*) 3.5 - 5.1 (mEq/L)    Chloride 101  96 - 112 (mEq/L)    CO2 28  19 - 32 (mEq/L)    Glucose, Bld 103 (*) 70 - 99 (mg/dL)    BUN 11  6 - 23 (mg/dL)    Creatinine, Ser 5.62  0.50 - 1.10 (mg/dL)    Calcium 9.0  8.4 - 10.5 (mg/dL)    Total Protein 7.5  6.0 - 8.3 (g/dL)    Albumin 4.3  3.5 - 5.2 (g/dL)    AST 19  0 - 37 (U/L)    ALT 26  0 - 35 (U/L)    Alkaline Phosphatase 106  39 - 117 (U/L)    Total Bilirubin 0.2 (*) 0.3 - 1.2 (mg/dL)    GFR calc non Af Amer >90  >90 (mL/min)    GFR calc Af Amer >90  >90 (mL/min)     Diagnoses:  Axis I: Alcohol Dependence;  Substance Induced Mood Disorder.   Plan:  1. Will continue the medication Celexa at the increased dosage of 30 mgs po q am for depression.  2. Will continue Librium Detox protocol for detox from her use of alcohol.  3. Will continue KCL 20 mEq po qam and hs for hypokalemia; repeat CMP pending.  4. Will continue the patient on her non-psychiatric medications for hypertension and hyperlipidemia.  5. Laboratory studies reviewed.  6. Will continue to monitor.   Medication education completed.  Pros, cons, risks, potential side effects and benefits (including  no treatment) were discussed with pt.  Pt agreeable with the plan.  See orders.  Discussed with team.  [name] 12/10/2011, 3:12 PM

## 2011-12-10 NOTE — Progress Notes (Signed)
Patient ID: [name], female   DOB: [birth year], 56 y.o.   MRN: [id number] Talked to patient about changes she plans to make after d/C.  She says she plans to exercise and to start doing things with friends in the evening instead of staying home drinking.  She carpools her son in the evening and then plans to try to focus on eating better  and cooking.  Patient doesn't know the 12 steps, and says she plans to try to learn them and return to church with friends who have encouraged her to go.  Pt denies SI or pain.  She is interacting with peers and staff and attending groups.

## 2011-12-10 NOTE — Progress Notes (Signed)
[facility] Group Notes:  (Counselor/Nursing/MHT/Case Management/Adjunct)  12/10/2011 2:52 PM  Type of Therapy:  Group Therapy at 11:00  Participation Level:  Minimal input ~ actively attentive  Participation Quality:  Attentive  Affect:  Appropriate  Cognitive:  Alert and Oriented  Insight:  Good  Engagement in Group:  Limited  Engagement in Therapy:  Good  Modes of Intervention:  Clarification, Education, Socialization and Support  Summary of Progress/Problems:  Focus of group discussion today was feelings related to one's diagnosis of substance abuse after completing a self test and reading new ASAM definition.  [name] was actively listening and engaged although verbal remarks were at a minimum. She did share that "the alcohol is the only thing I have been looking forward to lately."  [name] 12/10/2011, 2:52 PM

## 2011-12-11 DIAGNOSIS — F329 Major depressive disorder, single episode, unspecified: Secondary | ICD-10-CM

## 2011-12-11 NOTE — Progress Notes (Signed)
Pt attended discharge planning group and actively participated.  Pt presents with calm mood and affect.  Pt denies having depression, anxiety and SI.  Pt reports feeling stable to d/c tomorrow.  Pt will follow up at [facility] for medication management and therapy.  Pt will also follow up with AA Meetings.  Provided pt with a list of meetings in the area.  No further needs voiced by pt at this time.    Reyes Ivan, LCSWA 12/11/2011  10:12 AM

## 2011-12-11 NOTE — Progress Notes (Signed)
[facility] Adult Inpatient Family/Significant Other Suicide Prevention Education  Suicide Prevention Education:  Education Completed; Eileen [facility], patient's mother, at [phone] has been identified by the patient as the family member/significant other with whom the patient will be residing, and identified as the person(s) who will aid the patient in the event of a mental health crisis (suicidal ideations/suicide attempt).  With written consent from the patient, the family member/significant other has been provided the following suicide prevention education, prior to the and/or following the discharge of the patient.  The suicide prevention education provided includes the following:  Suicide risk factors  Suicide prevention and interventions  National Suicide Hotline telephone number  [facility] assessment telephone number  [facility] Emergency Assistance 911  [facility] and/or Residential Mobile Crisis Unit telephone number  Request made of family/significant other to:  Remove weapons (e.g., guns, rifles, knives), all items previously/currently identified as safety concern.    Remove drugs/medications (over-the-counter, prescriptions, illicit drugs), all items previously/currently identified as a safety concern.  Mrs Donnie Aho reports patient has no firearms in the home.   The family member/significant other verbalizes understanding of the suicide prevention education information provided.  The family member/significant other agrees to remove the items of safety concern listed above.  [name] 12/11/2011, 2:21 PM

## 2011-12-11 NOTE — Progress Notes (Signed)
Patient pleasant and cooperative upon my approach. Patient active in group therapy, has meals in dining room. Patient denies SI/HI, denies A/V hallucinations. Patient denies symptoms of withdrawal. Patient states she is  "getting more out of groups today than yesterday." Patient given support and encouragement. Patient denies any needs or concerns at this time.  Patient remains safe on unit with Q15 minute checks for safety. Will continue to monitor.

## 2011-12-11 NOTE — Progress Notes (Signed)
[facility] Group Notes:  (Counselor/Nursing/MHT/Case Management/Adjunct)  12/12/2011 11:08 AM  Type of Therapy:  Group Therapy at 11 on 6-12   Participation Level:  Minimal  Participation Quality:  Attentive  Affect:  Appropriate  Cognitive:  Appropriate  Insight:  Limited  Engagement in Group:  Limited  Engagement in Therapy:  Limited  Modes of Intervention:  Clarification, Socialization and Support  Summary of Progress/Problems:The focus of this group session was to process how we deal with difficult emotions and share with others the patterns that play out when we are reacting to the emotion verses the situation.  [name] was quiet during group bur very attentive and inquisitive after group in regard to HALT (Hungry, angry lonely, tired recovery tool). [name] 12/12/2011, 11:08 AM

## 2011-12-11 NOTE — Progress Notes (Signed)
Patient Identification:  [name] Date of Evaluation:  12/11/2011  DIAGNOSIS: Depression, Alcohol Dependence History of Present Illness: Pt  Has been admitted for alcohol detox.  She is in a new relationship and wants to stop drinking her usual 6-pack ea.  Night after work.    Past Psychiatric History: She has been at [facility] for alcohol detox. And overdose with Celexa.    Past Medical History:     Past Medical History  Diagnosis Date  . Hypertension   . Depression   . Suicidal overdose        Past Surgical History  Procedure Date  . Fracture surgery     fractured left ankle 2007  . Ganglion cyst excision     left foot    Allergies: No Known Allergies  Current Medications:  Prior to Admission medications   Medication Sig Start Date End Date Taking? Authorizing Provider  amLODipine (NORVASC) 10 MG tablet Take 10 mg by mouth daily.   Yes Historical Provider, MD  citalopram (CELEXA) 20 MG tablet Take 20 mg by mouth daily.   Yes Historical Provider, MD  simvastatin (ZOCOR) 20 MG tablet Take 20 mg by mouth at bedtime.   Yes Historical Provider, MD    Social History:    reports that she has been smoking Cigarettes.  She has a 7.5 pack-year smoking history. She has never used smokeless tobacco. She reports that she drinks about 33 ounces of alcohol per week. She reports that she does not use illicit drugs.   Family History:   Pt is seen today and reports: ADLs: Intact Sleep:  Pt reports sleep is fair: He sleeps about 8 hours   Appetite:  Poor. She  Has a good appetite   Mild >(1-10). Severe Hopelessness (1-10) : 0 Depression (1-10):0 Anxiety (1-10):  0  Suicidal ideation;  Pt denies any suicidal ideations today.      Plan:  No     Intent:  No     Means:  No  Homicidal Ideation:  Pt denies any homicidal ideations today     Plan:  No     Intent:  No     Means:  No  GENERAL APPEARANCE/BEHAVIOR:  THE PATIENT WAS MORE COOPERATIVE TODAY WITH THIS PROVIDER Eye  contact:  Good Speech: Appropriate in rate and volume today with no pressure; spontaneous Motor Behavior:  Good but tired Level of Consciousness: Alert and Oriented X 3  Mood:  Appears to be euthymic today Affect:   Appears full, congruent; smiling Anxiety Level;  None Thought Process:  Logical, sequential and organized plans with anticipated positive outcomes Thought Content: Pt denies auditory, visual hallucinations.   Perception:  No paranoia or delusions  Judgment: Fair Insight: Very good, Aware of impact of alcohol and cigarettes on her health.  Cognition:  Oriented to person, place and time  Sleep:  Number of hours : 8   Review of Systems: Neurological;  Pt has no headaches today.  She has some headaches  That are ~ 2(1-10) G.I.:  Pt denies constipation Musculoskeletal:  (1-10)0 Assessment/Plan:   1. Pt witll have daily contact to assess and evaaluate symptoms and progress in treatment.  2. Medication management 3. The patient will deny suical ideations or homicidal ideation for 48 hours prior to discharge and have a depression and anxiety retin of 3 or less.   4. The patient will also deny any auditory or visual hallucinations or delusional thinking.  5. The patient will deny any  sumptoms of substance withdrawal at time of discharge.   Plan: 1. Will continue with Celexa20 mg daily 2. Laboratory studies reviewed 3. Will continue to monitor.   Lavella Myren J. Ferol Luz, MD Psychiatrist 12/11/2011 3:29 PM

## 2011-12-11 NOTE — Progress Notes (Signed)
Patient ID: [name], female   DOB: [birth year], 56 y.o.   MRN: [id number] Patient pleasant and cooperative. Patient states she feels better but she was tired today.  Patient seems sad and depressed and rates depression 2/10 tonight.  Patient denies anxiety and denies SI/HI.  Patient in room resting staff to monitor Q74mins for safety.

## 2011-12-11 NOTE — Progress Notes (Signed)
Patient ID: [name], female   DOB: 04-May-1956, 56 y.o.   MRN: [id number] Pt is awake and active on the unit this AM. Pt denies SI/HI and A/V hallucinations. Pt is participating in the milieu and is cooperative with staff. Pt is attending groups and is in a pleasant mood this AM. Pt forwards little in conversation. Writer will continue to monitor.

## 2011-12-12 MED ORDER — CITALOPRAM HYDROBROMIDE 10 MG PO TABS
30.0000 mg | ORAL_TABLET | Freq: Every day | ORAL | Status: DC
Start: 2011-12-12 — End: 2011-12-12
  Filled 2011-12-12 (×2): qty 42

## 2011-12-12 MED ORDER — CITALOPRAM HYDROBROMIDE 10 MG PO TABS: 30.0000 mg | ORAL_TABLET | Freq: Every day | ORAL | Status: DC

## 2011-12-12 MED ORDER — SIMVASTATIN 20 MG PO TABS: 20.0000 mg | ORAL_TABLET | Freq: Every day | ORAL | Status: DC

## 2011-12-12 MED ORDER — AMLODIPINE BESYLATE 10 MG PO TABS: 10.0000 mg | ORAL_TABLET | Freq: Every day | ORAL | Status: DC

## 2011-12-12 MED ORDER — TRAZODONE HCL 50 MG PO TABS: 50.0000 mg | ORAL_TABLET | Freq: Every evening | ORAL | Status: DC | PRN

## 2011-12-12 NOTE — [facility] Suicide Risk Assessment (Signed)
Suicide Risk Assessment  Discharge Assessment      Demographic factors: Divorced or widowed;Caucasian;Low socioeconomic status;Living alone  Current Mental Status Per Nursing Assessment:  On Admission:   (denies SI currently) At Discharge:  Pt denied any SI/HI/thoughts of self harm or acute psychiatric issues in treatment team with clinical, nursing and medical team present.  Current Mental Status Per Physician: Patient seen and evaluated. Chart reviewed. Patient stated that her mood was "good". Her affect was mood congruent and euthymic. She denied any current thoughts of self injurious behavior, suicidal ideation or homicidal ideation. There were no auditory or visual hallucinations, paranoia, delusional thought processes, or mania noted.  Thought process was linear and goal directed.  No psychomotor agitation or retardation was noted. Speech was normal rate, tone and volume. Eye contact was good. Judgment and insight are fair.  Patient has been up and engaged on the unit.  No acute safety concerns reported from team.  No withdrawal s/s noted at this time.  Slept "wonderful" last night.  Loss Factors: Financial problems / change in socioeconomic status (substance abuse)  Historical Factors: Prior suicide attempts;Family history of mental illness or substance abuse;Impulsivity  Risk Reduction Factors:   Sense of responsibility to family;Religious beliefs about death;Employed;Positive social support;Positive coping skills or problem solving skills; AA  Discharge Diagnoses:   AXIS I:  Alcohol Dependence; SIMD, resolving AXIS II: Deferred AXIS III: Hypokalemia, resolved; HL Past Medical History  Diagnosis Date  . Hypertension   . Depression   . Suicidal overdose    AXIS IV: Moderate AXIS V:  50  Cognitive Features That Contribute To Risk: none.  Suicide Risk: Patient is currently viewed as a low risk of harm to herself and others in light of her history and risk factors. There are  no acute safety concerns and she is stable for discharge. Continued sobriety, medication management and followup will mitigate against any potential increased risk in the future.   Plan Of Care/Follow-up recommendations: Pt seen and evaluated in treatment team. Chart reviewed.  Pt stable for and requesting discharge. Pt contracting for safety and does not currently meet [place] involuntary commitment criteria for continued hospitalization against her will.  Mental health treatment, medication management and continued sobriety will mitigate against the potential increased risk of harm to self and/or others.  Discussed the importance of recovery further with pt, as well as, tools to move forward in a healthy & safe manner.  Pt agreeable with the plan.  Discussed with the team.  Please see orders, follow up appointments per AVS with [facility] and pending full discharge summary.  Recommend follow up with AA.  Diet: Heart Healthy.  Activity: As tolerated.     Lupe Carney 12/12/2011, 2:23 PM

## 2011-12-12 NOTE — Progress Notes (Signed)
Patient ID: [name], female   DOB: 26-Apr-1956, 56 y.o.   MRN: [id number] Pt reviewed discharge instructions with pt including medications, follow up care and crisis intervention. Pt acknowledged understanding of instructions and states that she has no reservations about leaving [facility] at this time. Pt denies SI/HI and AVH. Pt mood and affect are appropriate to the situation. Pt is released into her own care.

## 2011-12-12 NOTE — Progress Notes (Signed)
Patient ID: [name], female   DOB: 08/02/1955, 56 y.o.   MRN: [id number] Pt is awake and active on the unit this AM. Pt denies SI/HI and A/V hallucinations. Pt is participating in the milieu and is cooperative with staff. Pt is in a pleasant mood this AM and is attending groups. Pt is vested in tx and plans to participate in AA after discharge. Pt has no complaints. Writer will continue to monitor.

## 2011-12-12 NOTE — Progress Notes (Signed)
12/12/2011         Time: 1415      Group Topic/Focus: The focus of this group is on enhancing the patient's understanding of leisure, barriers to leisure, and the importance of engaging in positive leisure activities upon discharge for improved total health.   Participation Level: Active  Participation Quality: Appropriate and Attentive  Affect: Appropriate  Cognitive: Oriented   Additional Comments: Patient able to identify positive leisure activities to engage in upon discharge.   Chelci Wintermute 12/12/2011 4:09 PM

## 2011-12-12 NOTE — Tx Team (Signed)
Interdisciplinary Treatment Plan Update (Adult)  Date:  12/12/2011  Time Reviewed:  10:03 AM   Progress in Treatment: Attending groups: Yes Participating in groups:  Yes Taking medication as prescribed: Yes Tolerating medication:  Yes Family/Significant othe contact made:  Yes contact made with mother Patient understands diagnosis:  Yes Discussing patient identified problems/goals with staff:  Yes Medical problems stabilized or resolved:  Yes Denies suicidal/homicidal ideation: Yes Issues/concerns per patient self-inventory:  None identified Other: N/A  New problem(s) identified: None Identified  Reason for Continuation of Hospitalization: Stable to d/c  Interventions implemented related to continuation of hospitalization: Stable to d/c  Additional comments: N/A  Estimated length of stay: D/C today  Discharge Plan: Pt will follow up with [facility] for medication management and therapy.  Pt will also follow up with AA meetings.    New goal(s): N/A  Review of initial/current patient goals per problem list:    1.  Goal(s): Address substance use  Met:  Yes  Target date: by discharge  As evidenced by: completed detox protocol and referred to appropriate treatment  2.  Goal (s): Reduce depressive and anxiety symptoms  Met:  Yes  Target date: by discharge  As evidenced by: Reducing depression from a 10 to a 3 as reported by pt.  Pt denies depression and anxiety.    3.  Goal(s): Eliminate SI  Met:  Yes  Target date: by discharge  As evidenced by: pt denies SI.     Attendees: [name] 12/12/2011 10:06 AM   Family:     Physician:  Lupe Carney, DO 12/12/2011 10:03 AM   Nursing: Izola Price, RN 12/12/2011 10:03 AM   Case Manager:  Reyes Ivan, LCSWA 12/12/2011  10:03 AM   Counselor:  Ronda Fairly, LCSWA 12/12/2011  10:03 AM   Other:  Richelle Ito, LCSW 12/12/2011 10:03 AM   Other:     Other:     Other:      Scribe  for Treatment Team:   Reyes Ivan 12/12/2011 10:03 AM

## 2011-12-12 NOTE — Progress Notes (Signed)
[facility] Case Management Discharge Plan:  Will you be returning to the same living situation after discharge: Yes,  return home At discharge, do you have transportation home?:Yes,  has transportation home Do you have the ability to pay for your medications:Yes,  access to meds  Release of information consent forms completed and in the chart;  Patient's signature needed at discharge.  Patient to Follow up at:  Follow-up Information    Follow up with [facility] on 12/16/2011. (Appointment scheduled at 2:30 pm with Stanton Kidney (therapy))    Contact information:   [address] [place], [place] [address] [phone]      Follow up with [facility] on 12/19/2011. (Appointment scheduled at 2:00 pm with Tresa Endo (medication management))    Contact information:   [address] [place], [place] [address] [phone]         Patient denies SI/HI:   Yes,  denies SI/HI    Safety Planning and Suicide Prevention discussed:  Yes,  discussed with pt  Barrier to discharge identified:No.  Summary and Recommendations: Pt attended discharge planning group and actively participated.  Pt presents with calm mood and affect.  Pt denies having depression, anxiety and SI.  Pt reports feeling stable to d/c.  No recommendations from SW.  No further needs voiced by pt.  Pt stable to discharge.     [name] 12/12/2011, 10:07 AM

## 2011-12-12 NOTE — Progress Notes (Signed)
[facility] Group Notes:  (Counselor/Nursing/MHT/Case Management/Adjunct)  12/12/2011 11:38 PM  Type of Therapy:  Group Therapy at 11  Participation Level:  Active  Participation Quality:  Appropriate, Attentive and Sharing  Affect:  Appropriate  Cognitive:  Alert, Appropriate and Oriented  Insight:  Improved  Engagement in Group:  Good  Engagement in Therapy:  Good  Modes of Intervention:  Clarification, Socialization and Support  Summary of Progress/Problems:Focus of group processing discussion was on balance in life; the components in life which have a negative influence on balance and the components which make for a more balanced life. Laniesha shared that "depression, loneliness and hiding my drinking" were negative stressors in her life.   Lorelle acknowledged "my drinking which I thought was my reward and soothed my depression was actually making it worse I see now."  Positive influences which she shared she would like more of in her life were "supports, reconnection with family and a relationship in my future, I hope."  This led to discussion of sponsorship and resources to contact via AA and other supports.    [facility] Group Notes:  (Counselor/Nursing/MHT/Case Management/Adjunct)  12/12/2011 11:48 PM  Type of Therapy:  Group Therapy at 1:15  Participation Level:  Active  Participation Quality:  Attentive and Sharing  Affect:  Appropriate  Cognitive:  Appropriate  Insight:  Good  Engagement in Group:  Good  Engagement in Therapy:  Limited, as she sat with belongings expecting discharge  Modes of Intervention:  Clarification and Support  Summary of Progress/Problems: Lequita ultimately participated in activity in which patients choose photographs to represent what their life would look and feel like were it in balance and another for out of balance. At beginning of group she was expecting to discharge at any moment; ultimately she became engaged in discussion and chose two photos  to share. The out of balance photo depicted a woman alone in an all white bathroom focused on the scales; "she looks stuck, like much of my time, sitting waiting for a sign that all is well." Her balanced phot was of a woman dancing in the rain. "now this l;ooks like finding joy in the moment to me."   [name] 12/12/2011, 11:48 PM

## 2011-12-31 NOTE — Discharge Summary (Signed)
Physician Discharge Summary Note  [name] is an 56 y.o., female MRN:  [id number] DOB:  Apr 13, 1956 Patient phone:  [phone] (home)  Patient address:   [address] [place] [address],   Date of Admission:  12/09/2011 Date of Discharge: 12/12/11  Reason for Admission: Alcohol detoxification.  Discharge Diagnoses: Principal Problem:  *Alcohol dependence Active Problems:  Substance induced mood disorder   Axis Diagnosis:   AXIS I:  Substance induced mood disorder, Alcohol dependence AXIS II:  Deferred AXIS III:   Past Medical History  Diagnosis Date  . Hypertension   . Depression   . Suicidal overdose    AXIS IV:  other psychosocial or environmental problems AXIS V:  67  Level of Care:  OP  Hospital Course:  History of Present Illness: This is a voluntary admission for [name] who notes that she has relapsed and is now drinking 6 beers a day during the week and 12 a day on the weekends. Prior to coming in she notes that she was sleeping well, but her appetite was decreased, she rates her depression as a 7-8/10, today she denies suicidal ideation, but relates that she always gets more depressed when she is drinking. She denies anxiety and reports no AH/VH.  She rates her hopelessness as a 3/10. She reports no symptoms of withdrawal.  While a patient in this hospital, [name] was ordered and received Librium protocol for her alcohol detoxification. She was also enrolled in group counseling sessions and activities of which she participated as recommended. She also attended the AA/NA meetings that is being offered and held in this hospital. Patient also received medication management and monitoring for her other medical issues and or conditions. She tolerated her treatment regimen without any significant adverse effects and or reactions reported and or noted.  Patient did attend treatment team meeting this am and met with her team. Her substance abuse  issues, symptoms, treatment plan and response to treatment discussed. Patient endorsed that she is doing well and stable for discharge. She will continue psychiatric care on outpatient basis to maintain sobriety. She will follow-up at the [facility] on 12/16/11 @ 2:30 pm for therapy and on 12/24/11 @ 2:00 pm for medication management. The address, dates and times for this appointments provided for patient.  Patient upon discharge adamantly denies suicidal, homicidal ideations, auditory, visual hallucinations, delusional thinking and or any withdrawal symptoms. She left [facility] facility with all personal belongings via family transport in no apparent distress.    Consults:  None  Significant Diagnostic Studies:  labs: CBC with, CMP, Toxicology, UDS  Discharge Vitals:   Blood pressure 108/74, pulse 94, temperature 97.8 F (36.6 C), temperature source Oral, resp. rate 16, height 5' 1.25" (1.556 m), weight 63.05 kg (139 lb).  Mental Status Exam: See Mental Status Examination and Suicide Risk Assessment completed by Attending Physician prior to discharge.  Discharge destination:  Home  Is patient on multiple antipsychotic therapies at discharge:  No   Has Patient had three or more failed trials of antipsychotic monotherapy by history:  No  Recommended Plan for Multiple Antipsychotic Therapies: NA   Medication List  As of 12/31/2011  8:59 AM   TAKE these medications      Indication    amLODipine 10 MG tablet   Commonly known as: NORVASC   Take 1 tablet (10 mg total) by mouth daily. For high blood pressure       citalopram 10 MG tablet  Commonly known as: CELEXA   Take 3 tablets (30 mg total) by mouth daily. For depression       simvastatin 20 MG tablet   Commonly known as: ZOCOR   Take 1 tablet (20 mg total) by mouth at bedtime. For cholesterol control       traZODone 50 MG tablet   Commonly known as: DESYREL   Take 1 tablet (50 mg total) by mouth at bedtime as  needed for sleep. For sleep            Follow-up Information    Follow up with [facility] on 12/16/2011. (Appointment scheduled at 2:30 pm with [name] (therapy))    Contact information:   [address] [place], [place] [address] [phone]      Follow up with [facility] on 12/19/2011. (Appointment scheduled at 2:00 pm with [name] (medication management))    Contact information:   [address] [place], [place] [address] [phone]         Follow-up recommendations:  Activity:  as tolerated Other:  Keep all scheduled follow-up appointments as recommended.   Comments:  Take all your medications as prescribed by your mental healthcare provider. Report any adverse effects and or reactions from your medicines to your outpatient provider promptly. Patient is instructed and cautioned to not engage in alcohol and or illegal drug use while on prescription medicines. In the event of worsening symptoms, patient is instructed to call the crisis hotline, 911 and or go to the nearest ED for appropriate evaluation and treatment of symptoms. Follow-up with your primary care provider for your other medical issues, concerns and or health care needs.     SignedArmandina Stammer I 12/31/2011, 8:59 AM

## 2012-01-05 ENCOUNTER — Emergency Department (HOSPITAL_COMMUNITY)
Admission: EM | Admit: 2012-01-05 | Discharge: 2012-01-06 | Disposition: A | Discharge status: Home / Self Care | Payer: BC Managed Care – PPO | Attending: Emergency Medicine | Admitting: Emergency Medicine

## 2012-01-05 ENCOUNTER — Encounter (HOSPITAL_COMMUNITY): Payer: Self-pay | Admitting: Emergency Medicine

## 2012-01-05 DIAGNOSIS — R4789 Other speech disturbances: Secondary | ICD-10-CM | POA: Insufficient documentation

## 2012-01-05 DIAGNOSIS — R413 Other amnesia: Secondary | ICD-10-CM | POA: Insufficient documentation

## 2012-01-05 DIAGNOSIS — F3289 Other specified depressive episodes: Secondary | ICD-10-CM | POA: Insufficient documentation

## 2012-01-05 DIAGNOSIS — F1010 Alcohol abuse, uncomplicated: Secondary | ICD-10-CM | POA: Insufficient documentation

## 2012-01-05 DIAGNOSIS — F329 Major depressive disorder, single episode, unspecified: Secondary | ICD-10-CM | POA: Insufficient documentation

## 2012-01-05 DIAGNOSIS — Z79899 Other long term (current) drug therapy: Secondary | ICD-10-CM | POA: Insufficient documentation

## 2012-01-05 LAB — PREGNANCY, URINE: Preg Test, Ur: NEGATIVE

## 2012-01-05 LAB — RAPID URINE DRUG SCREEN, HOSP PERFORMED
Amphetamines: NOT DETECTED
Tetrahydrocannabinol: NOT DETECTED

## 2012-01-05 MED ORDER — LORAZEPAM 1 MG PO TABS: 0.0000 mg | ORAL_TABLET | Freq: Four times a day (QID) | ORAL | Status: DC

## 2012-01-05 MED ORDER — IBUPROFEN 600 MG PO TABS: 600.0000 mg | ORAL_TABLET | Freq: Three times a day (TID) | ORAL | Status: DC | PRN

## 2012-01-05 MED ORDER — FOLIC ACID 1 MG PO TABS: 1.0000 mg | ORAL_TABLET | Freq: Every day | ORAL | Status: DC

## 2012-01-05 MED ORDER — LORAZEPAM 2 MG/ML IJ SOLN: 1.0000 mg | Freq: Four times a day (QID) | INTRAMUSCULAR | Status: DC | PRN

## 2012-01-05 MED ORDER — ONDANSETRON HCL 4 MG PO TABS: 4.0000 mg | ORAL_TABLET | Freq: Three times a day (TID) | ORAL | Status: DC | PRN

## 2012-01-05 MED ORDER — VITAMIN B-1 100 MG PO TABS: 100.0000 mg | ORAL_TABLET | Freq: Every day | ORAL | Status: DC

## 2012-01-05 MED ORDER — THIAMINE HCL 100 MG/ML IJ SOLN: 100.0000 mg | Freq: Every day | INTRAMUSCULAR | Status: DC

## 2012-01-05 MED ORDER — ADULT MULTIVITAMIN W/MINERALS CH: 1.0000 | ORAL_TABLET | Freq: Every day | ORAL | Status: DC

## 2012-01-05 MED ORDER — NICOTINE 21 MG/24HR TD PT24: 21.0000 mg | MEDICATED_PATCH | Freq: Every day | TRANSDERMAL | Status: DC

## 2012-01-05 MED ORDER — LORAZEPAM 1 MG PO TABS: 1.0000 mg | ORAL_TABLET | Freq: Four times a day (QID) | ORAL | Status: DC | PRN

## 2012-01-05 MED ORDER — LORAZEPAM 1 MG PO TABS: 0.0000 mg | ORAL_TABLET | Freq: Two times a day (BID) | ORAL | Status: DC

## 2012-01-05 NOTE — ED Notes (Signed)
Belongings placed in locker 2 triage.

## 2012-01-05 NOTE — ED Notes (Signed)
Lab bedside.

## 2012-01-05 NOTE — ED Notes (Signed)
Pt alert, arrives from home, c/o SI, pt states took three trazodone and drank 12 pack of beer, resp even unlabored, skin pwd, ambulates to triage

## 2012-01-05 NOTE — ED Notes (Signed)
Pt changed into [name] scrubs and wanded by security 

## 2012-01-06 ENCOUNTER — Telehealth (HOSPITAL_COMMUNITY): Payer: Self-pay | Admitting: Licensed Clinical Social Worker

## 2012-01-06 LAB — BASIC METABOLIC PANEL
BUN: 14 mg/dL (ref 6–23)
CO2: 23 mEq/L (ref 19–32)
Chloride: 102 mEq/L (ref 96–112)
Creatinine, Ser: 0.67 mg/dL (ref 0.50–1.10)

## 2012-01-06 LAB — CBC
HCT: 38.6 % (ref 36.0–46.0)
MCH: 30.1 pg (ref 26.0–34.0)
MCV: 86.2 fL (ref 78.0–100.0)
RBC: 4.48 MIL/uL (ref 3.87–5.11)
RDW: 12.5 % (ref 11.5–15.5)
WBC: 5.2 10*3/uL (ref 4.0–10.5)

## 2012-01-06 LAB — ETHANOL: Alcohol, Ethyl (B): 169 mg/dL — ABNORMAL HIGH (ref 0–11)

## 2012-01-06 NOTE — ED Notes (Signed)
Telepsych results given to Dr Juleen China and copy to ACT team.

## 2012-01-06 NOTE — ED Notes (Signed)
Pt called her Mother who will pick her up upon discharge.

## 2012-01-06 NOTE — BH Assessment (Signed)
Assessment Note   [name] is an 56 y.o. female.  Patient reports that she called the ER because she was drinking and took 3 Trazodone.  Patient denies wanting to hurt herself.  Patient's BAL was 169 and her drug screen was negative.  Patient reports increased levels of depression.  Patient is unable to determine a cause of her depression.  Patient reports that she was sober for three weeks.  Patient reports that she now drinks 4-5 beers daily.  Patient reports having her first drink in her late teens.  Patient reports that within this past year she has not been able to control the amount of alcohol and beer that she is drinking.  Patient reports that she was hospitalized for one week at [facility] for detox and treatment in June 2013.    Patient denies any psychosis.  Patient denies any self-mutilation.  Patient denies any HI.  Patient has requested outpatient referrals for substance abuse treatment due to her work schedule.  Patient reports that she has been working for the same company for 23 years and she is not able to take any more time off from work.  Patient reports that she is able to sign a no harm contract.  Therapist consulted with Dr. Patria Mane and the patient will have a Telepsych.      Axis I: Alcohol Abuse Axis II: Deferred Axis III:  Past Medical History  Diagnosis Date  . Hypertension   . Depression   . Suicidal overdose    Axis IV: other psychosocial or environmental problems, problems related to social environment and problems with primary support group Axis V: 41-50 serious symptoms  Past Medical History:  Past Medical History  Diagnosis Date  . Hypertension   . Depression   . Suicidal overdose     Past Surgical History  Procedure Date  . Fracture surgery     fractured left ankle 2007  . Ganglion cyst excision     left foot    Family History: No family history on file.  Social History:  reports that she has been smoking Cigarettes.  She has a 7.5  pack-year smoking history. She has never used smokeless tobacco. She reports that she drinks about 33 ounces of alcohol per week. She reports that she does not use illicit drugs.  Additional Social History:     CIWA: CIWA-Ar BP: 94/62 mmHg Pulse Rate: 81  COWS:    Allergies: No Known Allergies  Home Medications:  (Not in a hospital admission)  OB/GYN Status:  No LMP recorded. Patient is postmenopausal.  General Assessment Data Location of Assessment: [facility] ED ACT Assessment: Yes Living Arrangements: Alone Can pt return to current living arrangement?: Yes Admission Status: Voluntary Is patient capable of signing voluntary admission?: Yes Transfer from: Home Referral Source: Self/Family/Friend     Risk to self Suicidal Ideation: No-Not Currently/Within Last 6 Months Suicidal Intent: No-Not Currently/Within Last 6 Months Is patient at risk for suicide?: Yes Suicidal Plan?: No Access to Means: No What has been your use of drugs/alcohol within the last 12 months?: drinking daily 4-5 beers  Previous Attempts/Gestures: Yes How many times?: 1  Other Self Harm Risks: no Triggers for Past Attempts: Other (Comment) Intentional Self Injurious Behavior: None Family Suicide History: Yes Recent stressful life event(s): Trauma (Comment);Conflict (Comment);Other (Comment) Persecutory voices/beliefs?: No Depression: Yes Depression Symptoms: Tearfulness;Guilt;Loss of interest in usual pleasures;Feeling worthless/self pity Substance abuse history and/or treatment for substance abuse?: Yes Suicide prevention information given to non-admitted  patients: Yes  Risk to Others Homicidal Ideation: No Thoughts of Harm to Others: No Current Homicidal Intent: No Current Homicidal Plan: No Access to Homicidal Means: No Identified Victim: none reported History of harm to others?: No Assessment of Violence: None Noted Violent Behavior Description: none reported Does patient have access to  weapons?: No Criminal Charges Pending?: No Does patient have a court date: No  Psychosis Hallucinations: None noted Delusions: None noted  Mental Status Report Appear/Hygiene: Other (Comment) Eye Contact: Fair Motor Activity: Restlessness Speech: Logical/coherent Level of Consciousness: Alert Mood: Depressed;Sullen Affect: Depressed;Sad;Appropriate to circumstance Anxiety Level: None Thought Processes: Coherent;Relevant Judgement: Unimpaired Orientation: Person;Place;Time;Situation Obsessive Compulsive Thoughts/Behaviors: None  Cognitive Functioning Concentration: Decreased Memory: Recent Intact;Remote Intact IQ: Average Insight: Fair Impulse Control: Poor Appetite: Poor Weight Loss: 0  Weight Gain: 0  Sleep: Decreased Total Hours of Sleep: 5  Vegetative Symptoms: None  ADLScreening [facility] Assessment Services) Patient's cognitive ability adequate to safely complete daily activities?: Yes Patient able to express need for assistance with ADLs?: Yes Independently performs ADLs?: Yes  Abuse/Neglect [facility]) Physical Abuse: Denies Verbal Abuse: Denies Sexual Abuse: Denies  Prior Inpatient Therapy Prior Inpatient Therapy: Yes Prior Therapy Dates: August 2012 Prior Therapy Facilty/Provider(s): [facility] Reason for Treatment: SI w/ overdose and alchol abuse  Prior Outpatient Therapy Prior Outpatient Therapy: Yes Prior Therapy Dates: can't remember Prior Therapy Facilty/Provider(s): can't remember Reason for Treatment: unknown  ADL Screening (condition at time of admission) Patient's cognitive ability adequate to safely complete daily activities?: Yes Patient able to express need for assistance with ADLs?: Yes Independently performs ADLs?: Yes       Abuse/Neglect Assessment (Assessment to be complete while patient is alone) Physical Abuse: Denies Verbal Abuse: Denies Sexual Abuse: Denies Values / Beliefs Cultural Requests During Hospitalization: None Spiritual  Requests During Hospitalization: None        Additional Information 1:1 In Past 12 Months?: No CIRT Risk: No Elopement Risk: No Does patient have medical clearance?: Yes     Disposition: Pending Telepsych recommendations  Disposition Disposition of Patient: Other dispositions Other disposition(s): Other (Comment) ([place] Telepsych Recommendations )  On Site Evaluation by:   Reviewed with Physician:     Phillip Heal LaVerne 01/06/2012 3:44 AM

## 2012-01-06 NOTE — [facility] Counselor (Signed)
Per shift report, patient is pending telepsych which was already initiated by staff. Patient completed telepsych and the tele psychiatrist recommended discharge home with referrals to a drug and alcohol program.  Writer discussed disposition with EDP Dr. Patria Mane. Patient to be discharged home. Writer met with patient to discuss treatment options. Patient agreeable to follow up with a variety of referrals. Patient given all information needed to make the appropriate appointments with providers and programs in the community. Patient also has referral information to individual substance abuse therapist, mobile crises, and support groups. Patient discharge home with the list of referrals.

## 2012-01-06 NOTE — ED Provider Notes (Signed)
History     CSN: [id number]  Arrival date & time 01/05/12  2259   First MD Initiated Contact with Patient 01/05/12 2303      Chief Complaint  Patient presents with  . Medical Clearance  . Depression     The history is provided by the patient.   the patient reports long-standing history of alcohol abuse and reports that she's been sober for the past month up until approximately 6 days ago at which point she began drinking heavily again.  Tonight she took 3 trazodone and drink a 12 pack of beer.  She contacted and the EMS because she was having suicidal thoughts.  She does report she is depressed.  She's compliant with her medications.  She denies drug abuse.  Her symptoms are mild to moderate.  At this time she reports she was like to go home and states it is no longer suicidal.  The patient's mother is with her in the emergency department.  Past Medical History  Diagnosis Date  . Hypertension   . Depression   . Suicidal overdose     Past Surgical History  Procedure Date  . Fracture surgery     fractured left ankle 2007  . Ganglion cyst excision     left foot    No family history on file.  History  Substance Use Topics  . Smoking status: Current Everyday Smoker -- 1.5 packs/day for 5 years    Types: Cigarettes  . Smokeless tobacco: Never Used  . Alcohol Use: 33.0 oz/week    55 Cans of beer per week     daily 6-7 cans of beer    OB History    Grav Para Term Preterm Abortions TAB SAB Ect Mult Living                  Review of Systems  All other systems reviewed and are negative.    Allergies  Review of patient's allergies indicates no known allergies.  Home Medications   Current Outpatient Rx  Name Route Sig Dispense Refill  . AMLODIPINE BESYLATE 10 MG PO TABS Oral Take 1 tablet (10 mg total) by mouth daily. For high blood pressure    . CITALOPRAM HYDROBROMIDE 10 MG PO TABS Oral Take 3 tablets (30 mg total) by mouth daily. For depression 90 tablet 0  .  SIMVASTATIN 20 MG PO TABS Oral Take 1 tablet (20 mg total) by mouth at bedtime. For cholesterol control 30 tablet     BP 94/62  Pulse 81  Temp 97.9 F (36.6 C) (Oral)  Resp 16  SpO2 95%  Physical Exam  Nursing note and vitals reviewed. Constitutional: She is oriented to person, place, and time. She appears well-developed and well-nourished. No distress.  HENT:  Head: Normocephalic and atraumatic.  Eyes: EOM are normal.  Neck: Normal range of motion.  Cardiovascular: Normal rate, regular rhythm and normal heart sounds.   Pulmonary/Chest: Effort normal and breath sounds normal.  Abdominal: Soft. She exhibits no distension. There is no tenderness.  Musculoskeletal: Normal range of motion.  Neurological: She is alert and oriented to person, place, and time.  Skin: Skin is warm and dry.  Psychiatric: She has a normal mood and affect. Her behavior is normal. Her speech is slurred. She is not agitated and not aggressive. Cognition and memory are impaired. She expresses no suicidal plans and no homicidal plans.    ED Course  Procedures (including critical care time)  Labs Reviewed  BASIC  METABOLIC PANEL - Abnormal; Notable for the following:    Potassium 3.4 (*)     Glucose, Bld 108 (*)     All other components within normal limits  ETHANOL - Abnormal; Notable for the following:    Alcohol, Ethyl (B) 169 (*)     All other components within normal limits  CBC  URINE RAPID DRUG SCREEN (HOSP PERFORMED)  PREGNANCY, URINE   No results found.   1. Alcohol abuse       MDM  This time the patient elected a home but I do not believe this is in her best interest.  She called EMS herself or suicidal thoughts.  I will have to tell a psychiatrist to evaluate the patient the bedside when she is clinically more sober.  The patient is agreeable to staying at this time  7:55 AM The patient was seen and evaluated by the psychiatrist (Dr Jacky Kindle) who recommends discharge home.  He does not  believe the patient is a threat to herself or others       Lyanne Co, MD 01/06/12 [phone]

## 2012-01-06 NOTE — ED Notes (Signed)
Pt given DC instruction and referral sheet provided by ACT.

## 2012-04-30 ENCOUNTER — Encounter: Payer: Self-pay | Admitting: Internal Medicine

## 2012-10-29 ENCOUNTER — Other Ambulatory Visit: Payer: Self-pay

## 2012-10-29 DIAGNOSIS — Z1231 Encounter for screening mammogram for malignant neoplasm of breast: Secondary | ICD-10-CM

## 2012-12-09 ENCOUNTER — Ambulatory Visit
Admission: RE | Admit: 2012-12-09 | Discharge: 2012-12-09 | Disposition: A | Discharge status: Home / Self Care | Payer: BC Managed Care – PPO | Source: Ambulatory Visit

## 2012-12-09 DIAGNOSIS — Z1231 Encounter for screening mammogram for malignant neoplasm of breast: Secondary | ICD-10-CM

## 2013-12-02 ENCOUNTER — Other Ambulatory Visit: Payer: Self-pay

## 2013-12-02 DIAGNOSIS — Z1231 Encounter for screening mammogram for malignant neoplasm of breast: Secondary | ICD-10-CM

## 2013-12-03 ENCOUNTER — Other Ambulatory Visit: Payer: Self-pay | Admitting: Obstetrics and Gynecology

## 2013-12-03 DIAGNOSIS — Z780 Asymptomatic menopausal state: Secondary | ICD-10-CM

## 2013-12-06 ENCOUNTER — Emergency Department (HOSPITAL_COMMUNITY)
Admission: EM | Admit: 2013-12-06 | Discharge: 2013-12-07 | Disposition: A | Discharge status: Other Acute Inpatient Hospital | Payer: BC Managed Care – PPO | Attending: Emergency Medicine | Admitting: Emergency Medicine

## 2013-12-06 ENCOUNTER — Encounter (HOSPITAL_COMMUNITY): Payer: Self-pay | Admitting: Emergency Medicine

## 2013-12-06 DIAGNOSIS — F1010 Alcohol abuse, uncomplicated: Secondary | ICD-10-CM | POA: Diagnosis present

## 2013-12-06 DIAGNOSIS — F17200 Nicotine dependence, unspecified, uncomplicated: Secondary | ICD-10-CM | POA: Insufficient documentation

## 2013-12-06 DIAGNOSIS — F1020 Alcohol dependence, uncomplicated: Secondary | ICD-10-CM

## 2013-12-06 DIAGNOSIS — F3289 Other specified depressive episodes: Secondary | ICD-10-CM | POA: Insufficient documentation

## 2013-12-06 DIAGNOSIS — F32A Depression, unspecified: Secondary | ICD-10-CM

## 2013-12-06 DIAGNOSIS — F329 Major depressive disorder, single episode, unspecified: Secondary | ICD-10-CM | POA: Insufficient documentation

## 2013-12-06 DIAGNOSIS — Z79899 Other long term (current) drug therapy: Secondary | ICD-10-CM | POA: Insufficient documentation

## 2013-12-06 DIAGNOSIS — F1994 Other psychoactive substance use, unspecified with psychoactive substance-induced mood disorder: Secondary | ICD-10-CM | POA: Diagnosis present

## 2013-12-06 DIAGNOSIS — I10 Essential (primary) hypertension: Secondary | ICD-10-CM | POA: Insufficient documentation

## 2013-12-06 HISTORY — DX: Alcohol dependence, uncomplicated: F10.20

## 2013-12-06 LAB — COMPREHENSIVE METABOLIC PANEL
ALBUMIN: 4.5 g/dL (ref 3.5–5.2)
ALT: 61 U/L — ABNORMAL HIGH (ref 0–35)
AST: 36 U/L (ref 0–37)
Alkaline Phosphatase: 99 U/L (ref 39–117)
BUN: 10 mg/dL (ref 6–23)
CALCIUM: 9.2 mg/dL (ref 8.4–10.5)
CO2: 22 mEq/L (ref 19–32)
CREATININE: 0.59 mg/dL (ref 0.50–1.10)
Chloride: 101 mEq/L (ref 96–112)
GFR calc Af Amer: >90 mL/min (ref >90)
GFR calc non Af Amer: >90 mL/min (ref >90)
Glucose, Bld: 87 mg/dL (ref 70–99)
Potassium: 3.7 mEq/L (ref 3.7–5.3)
Sodium: 140 mEq/L (ref 137–147)
TOTAL PROTEIN: 7.9 g/dL (ref 6.0–8.3)
Total Bilirubin: 0.2 mg/dL — ABNORMAL LOW (ref 0.3–1.2)

## 2013-12-06 LAB — CBC
HEMATOCRIT: 43.3 % (ref 36.0–46.0)
HEMOGLOBIN: 14.9 g/dL (ref 12.0–15.0)
MCH: 30.5 pg (ref 26.0–34.0)
MCHC: 34.4 g/dL (ref 30.0–36.0)
MCV: 88.5 fL (ref 78.0–100.0)
PLATELETS: 360 10*3/uL (ref 150–400)
RBC: 4.89 MIL/uL (ref 3.87–5.11)
RDW: 12.4 % (ref 11.5–15.5)
WBC: 6 10*3/uL (ref 4.0–10.5)

## 2013-12-06 LAB — RAPID URINE DRUG SCREEN, HOSP PERFORMED
Amphetamines: NOT DETECTED
BENZODIAZEPINES: NOT DETECTED
Barbiturates: NOT DETECTED
COCAINE: NOT DETECTED
Opiates: NOT DETECTED
Tetrahydrocannabinol: NOT DETECTED

## 2013-12-06 LAB — SALICYLATE LEVEL

## 2013-12-06 LAB — ACETAMINOPHEN LEVEL

## 2013-12-06 LAB — ETHANOL: ALCOHOL ETHYL (B): 164 mg/dL — AB (ref 0–11)

## 2013-12-06 NOTE — ED Notes (Signed)
Patient resting in position of comfort with eyes closed RR WNL--even and unlabored with equal rise and fall of chest Patient in NAD Side rails up, call bell in reach  

## 2013-12-06 NOTE — ED Provider Notes (Signed)
CSN: [id number]     Arrival date & time 12/06/13  1937 History   First MD Initiated Contact with Patient 12/06/13 2026     Chief Complaint  Patient presents with  . Suicidal     (Consider location/radiation/quality/duration/timing/severity/associated sxs/prior Treatment) HPI  58 year old female with alcohol abuse. Long-standing history the same. Presenting today with the encouragement of her family. Last drink was shortly before arrival. Denies any other ingestion. She apparently reported suicidal ideation to nursing. She denies this to me. She is apathetic and doesn't care if she lives or dies, but has no active plans to harm herself.  No AVH. Is hoping for detox.   Past Medical History  Diagnosis Date  . Hypertension   . Depression   . Suicidal overdose   . Alcoholism    Past Surgical History  Procedure Laterality Date  . Fracture surgery      fractured left ankle 2007  . Ganglion cyst excision      left foot   History reviewed. No pertinent family history. History  Substance Use Topics  . Smoking status: Current Every Day Smoker -- 1.50 packs/day for 5 years    Types: Cigarettes  . Smokeless tobacco: Never Used  . Alcohol Use: 33.0 oz/week    55 Cans of beer per week     Comment: daily 6-7 cans of beer   OB History   Grav Para Term Preterm Abortions TAB SAB Ect Mult Living                 Review of Systems  All systems reviewed and negative, other than as noted in HPI.   Allergies  Review of patient's allergies indicates no known allergies.  Home Medications   Prior to Admission medications   Medication Sig Start Date End Date Taking? Authorizing Provider  amLODipine (NORVASC) 10 MG tablet Take 1 tablet (10 mg total) by mouth daily. For high blood pressure 12/12/11  Yes Encarnacion Slates, NP  citalopram (CELEXA) 20 MG tablet Take 20 mg by mouth daily.   Yes Historical Provider, MD   BP 98/66  Pulse 78  Temp(Src) 99.8 F (37.7 C) (Oral)  Resp 18  Ht 5'  (1.524 m)  Wt 148 lb (67.132 kg)  BMI 28.90 kg/m2  SpO2 97% Physical Exam  Nursing note and vitals reviewed. Constitutional: She appears well-developed and well-nourished. No distress.  HENT:  Head: Normocephalic and atraumatic.  Eyes: Conjunctivae are normal. Right eye exhibits no discharge. Left eye exhibits no discharge.  Neck: Neck supple.  Cardiovascular: Normal rate, regular rhythm and normal heart sounds.  Exam reveals no gallop and no friction rub.   No murmur heard. Pulmonary/Chest: Effort normal and breath sounds normal. No respiratory distress.  Abdominal: Soft. She exhibits no distension. There is no tenderness.  Musculoskeletal: She exhibits no edema and no tenderness.  Neurological: She is alert.  Skin: Skin is warm and dry.  Psychiatric: Thought content normal.  Seems withdrawn. Speech somewhat slow and low tone. Content appropriate. Does not appear to be responding to internal stimuli.     ED Course  Procedures (including critical care time) Labs Review Labs Reviewed  COMPREHENSIVE METABOLIC PANEL - Abnormal; Notable for the following:    ALT 61 (*)    Total Bilirubin 0.2 (*)    All other components within normal limits  ETHANOL - Abnormal; Notable for the following:    Alcohol, Ethyl (B) 164 (*)    All other components within normal limits  SALICYLATE LEVEL - Abnormal; Notable for the following:    Salicylate Lvl <0.7 (*)    All other components within normal limits  CBC  ACETAMINOPHEN LEVEL  URINE RAPID DRUG SCREEN (HOSP PERFORMED)    Imaging Review No results found.   EKG Interpretation   Date/Time:  Monday December 06 2013 20:01:37 EDT Ventricular Rate:  79 PR Interval:  161 QRS Duration: 93 QT Interval:  452 QTC Calculation: 518 R Axis:   -47 Text Interpretation:  Sinus rhythm left atrial enlargement Left anterior  fascicular block Borderline T abnormalities, anterior leads Prolonged QT  interval Confirmed by [place] ([id number]) on  12/06/2013 11:29:33 PM      MDM   Final diagnoses:  Alcohol dependence  Depression    57yF with etoh abuse. Reported to nursing that took 10 tabs of 20 mg celexa and that wanted to kill herself. She denies this to me though. At this point has been medically cleared. Pscyh eval.    Virgel Manifold, MD 12/07/13 [phone]

## 2013-12-06 NOTE — ED Notes (Signed)
Patient is alert and oriented x3.  She is wanting to be seen for suicidal ideations.  Patient states that  She doesn't care anymore and has given up.  She adds that she is an alcoholic and took 10 Celexa today.

## 2013-12-06 NOTE — ED Notes (Signed)
Pt has shirt, shorts, bra, underwear and sandals.   GIven to triage RN.

## 2013-12-06 NOTE — ED Notes (Signed)
Security called to wand patient 

## 2013-12-06 NOTE — ED Notes (Signed)
MD at bedside. 

## 2013-12-06 NOTE — Progress Notes (Signed)
  CARE MANAGEMENT ED NOTE 12/06/2013  [name], [name]   Account Number:  [id number]  Date Initiated:  12/06/2013  Documentation initiated by:  Livia Snellen  Subjective/Objective Assessment:   patient presents to Ed with SI, having drank alcohol and took 10 celexa today.     Subjective/Objective Assessment Detail:     Action/Plan:   Action/Plan Detail:   Anticipated DC Date:       Status Recommendation to Physician:   Result of Recommendation:    Other ED [place]  Other  PCP issues    Choice offered to / List presented to:            Status of service:  Completed, signed off  ED Comments:   ED Comments Detail:  EDCM spoke to patient at bedisde.  Patient reports her pcp is with [facility] on [address]. and has seen Dr. Dema Severin.  Patient confirms she has [employer].  No further EDCM needs at this time.

## 2013-12-07 ENCOUNTER — Observation Stay (HOSPITAL_COMMUNITY)
Admission: AD | Admit: 2013-12-07 | Discharge: 2013-12-08 | Disposition: A | Discharge status: Home / Self Care | Payer: BC Managed Care – PPO | Source: Intra-hospital | Attending: Psychiatry | Admitting: Psychiatry

## 2013-12-07 ENCOUNTER — Encounter (HOSPITAL_COMMUNITY): Payer: Self-pay | Admitting: Registered Nurse

## 2013-12-07 DIAGNOSIS — F329 Major depressive disorder, single episode, unspecified: Secondary | ICD-10-CM

## 2013-12-07 DIAGNOSIS — F17200 Nicotine dependence, unspecified, uncomplicated: Secondary | ICD-10-CM | POA: Insufficient documentation

## 2013-12-07 DIAGNOSIS — F1010 Alcohol abuse, uncomplicated: Secondary | ICD-10-CM | POA: Diagnosis present

## 2013-12-07 DIAGNOSIS — F1994 Other psychoactive substance use, unspecified with psychoactive substance-induced mood disorder: Secondary | ICD-10-CM

## 2013-12-07 DIAGNOSIS — F3289 Other specified depressive episodes: Secondary | ICD-10-CM

## 2013-12-07 DIAGNOSIS — I10 Essential (primary) hypertension: Secondary | ICD-10-CM | POA: Insufficient documentation

## 2013-12-07 DIAGNOSIS — F1020 Alcohol dependence, uncomplicated: Secondary | ICD-10-CM

## 2013-12-07 DIAGNOSIS — F332 Major depressive disorder, recurrent severe without psychotic features: Secondary | ICD-10-CM | POA: Insufficient documentation

## 2013-12-07 MED ORDER — HYDROXYZINE HCL 25 MG PO TABS: 25.0000 mg | ORAL_TABLET | Freq: Four times a day (QID) | ORAL | Status: DC | PRN

## 2013-12-07 MED ORDER — ONDANSETRON 4 MG PO TBDP: 4.0000 mg | ORAL_TABLET | Freq: Four times a day (QID) | ORAL | Status: DC | PRN

## 2013-12-07 MED ORDER — ALUM & MAG HYDROXIDE-SIMETH 200-200-20 MG/5ML PO SUSP: 30.0000 mL | ORAL | Status: DC | PRN

## 2013-12-07 MED ORDER — MAGNESIUM HYDROXIDE 400 MG/5ML PO SUSP: 30.0000 mL | Freq: Every day | ORAL | Status: DC | PRN

## 2013-12-07 MED ORDER — ADULT MULTIVITAMIN W/MINERALS CH
1.0000 | ORAL_TABLET | Freq: Every day | ORAL | Status: DC
  Administered 2013-12-07 – 2013-12-08 (×2): 1 via ORAL
  Filled 2013-12-07 (×5): qty 1

## 2013-12-07 MED ORDER — CHLORDIAZEPOXIDE HCL 25 MG PO CAPS
25.0000 mg | ORAL_CAPSULE | Freq: Once | ORAL | Status: AC
  Administered 2013-12-07: 25 mg via ORAL
  Filled 2013-12-07: qty 1

## 2013-12-07 MED ORDER — CHLORDIAZEPOXIDE HCL 25 MG PO CAPS: 25.0000 mg | ORAL_CAPSULE | ORAL | Status: DC

## 2013-12-07 MED ORDER — LOPERAMIDE HCL 2 MG PO CAPS: 2.0000 mg | ORAL_CAPSULE | ORAL | Status: DC | PRN

## 2013-12-07 MED ORDER — ACETAMINOPHEN 325 MG PO TABS: 650.0000 mg | ORAL_TABLET | Freq: Four times a day (QID) | ORAL | Status: DC | PRN

## 2013-12-07 MED ORDER — VITAMIN B-1 100 MG PO TABS
100.0000 mg | ORAL_TABLET | Freq: Every day | ORAL | Status: DC
  Administered 2013-12-07 – 2013-12-08 (×2): 100 mg via ORAL
  Filled 2013-12-07 (×5): qty 1

## 2013-12-07 MED ORDER — AMLODIPINE BESYLATE 10 MG PO TABS
10.0000 mg | ORAL_TABLET | Freq: Every day | ORAL | Status: DC
  Administered 2013-12-08: 10 mg via ORAL
  Filled 2013-12-07 (×2): qty 1
  Filled 2013-12-07: qty 2

## 2013-12-07 MED ORDER — CHLORDIAZEPOXIDE HCL 25 MG PO CAPS
25.0000 mg | ORAL_CAPSULE | Freq: Four times a day (QID) | ORAL | Status: DC | PRN
  Filled 2013-12-07: qty 1

## 2013-12-07 MED ORDER — CITALOPRAM HYDROBROMIDE 20 MG PO TABS
20.0000 mg | ORAL_TABLET | Freq: Every day | ORAL | Status: DC
  Filled 2013-12-07 (×2): qty 1

## 2013-12-07 MED ORDER — THIAMINE HCL 100 MG/ML IJ SOLN: 100.0000 mg | Freq: Once | INTRAMUSCULAR | Status: DC

## 2013-12-07 MED ORDER — CHLORDIAZEPOXIDE HCL 25 MG PO CAPS: 25.0000 mg | ORAL_CAPSULE | Freq: Every day | ORAL | Status: DC

## 2013-12-07 MED ORDER — CHLORDIAZEPOXIDE HCL 25 MG PO CAPS
25.0000 mg | ORAL_CAPSULE | Freq: Three times a day (TID) | ORAL | Status: DC
  Administered 2013-12-08 (×2): 25 mg via ORAL
  Filled 2013-12-07: qty 1

## 2013-12-07 MED ORDER — AMLODIPINE BESYLATE 10 MG PO TABS
10.0000 mg | ORAL_TABLET | Freq: Every day | ORAL | Status: DC
  Filled 2013-12-07 (×2): qty 1

## 2013-12-07 MED ORDER — VITAMIN B-1 100 MG PO TABS
100.0000 mg | ORAL_TABLET | Freq: Every day | ORAL | Status: DC
  Filled 2013-12-07 (×2): qty 1

## 2013-12-07 MED ORDER — CITALOPRAM HYDROBROMIDE 20 MG PO TABS
20.0000 mg | ORAL_TABLET | Freq: Every day | ORAL | Status: DC
  Administered 2013-12-08: 20 mg via ORAL
  Filled 2013-12-07 (×3): qty 1

## 2013-12-07 MED ORDER — CHLORDIAZEPOXIDE HCL 25 MG PO CAPS
25.0000 mg | ORAL_CAPSULE | Freq: Four times a day (QID) | ORAL | Status: AC
  Administered 2013-12-07 (×3): 25 mg via ORAL
  Filled 2013-12-07 (×3): qty 1

## 2013-12-07 NOTE — Progress Notes (Signed)
Patient in bed at the beginning of the shift resting quietly. Her mood and affects flat and depressed. She denied SI/Hi and denied Hallucinations. Writer asked patient if she's having any withdrawal symptoms. Patient stated; "not yet". Writer encouraged and supported patient. Patient receptive to encouragement and support. Q 15 minute check continues as ordered to maintain safety.

## 2013-12-07 NOTE — ED Notes (Signed)
Patient resting in position of comfort with eyes closed RR WNL--even and unlabored with equal rise and fall of chest Patient in NAD Side rails up, call bell in reach  

## 2013-12-07 NOTE — Consult Note (Signed)
  Patient states that she needs help with alcohol.  "I want help with my alcohol problem.  I took 10 Celexa but it wasn't to kill myself; I was trying to get more of a buzz.  I don't like the way I feel and I need help to get off of the alcohol.  My sons are supportive.  Patient denies suicidal/homicidal ideation, psychosis, and paranoia.  Agree with TTS assessment note. Recommend patient for observation and detox.  Will start Librium for alcohol detox.  Patient has been accepted to [facility] Observation bed 3.  Will monitor for safety and stabilization until transfer.    Alexea Blase B. Akaash Vandewater FNP-BC

## 2013-12-07 NOTE — Progress Notes (Signed)
Pt transferred from [facility] for observation. Presented to ED with request for detox and SI. She appears flat and depressed. Calm and cooperative with assessment. States she does agree she needs additional monitoring but cant commit to Inpatient treatment due to family, work and financial reasons. She currently denies SI/HI/AVH and contracts for safety. Unit expectations and POC reviewed and understanding verbalized. Skin assessed by Otila Kluver, RN and skin is clear. Q15 minute observation initiated upon presentation. Will continue current POC and q15 minute observation pending DC disposition.

## 2013-12-07 NOTE — ED Notes (Signed)
Pt accepted at [facility]- no bed available. Pt will go to Behavior holding until. Pt verbalized understanding

## 2013-12-07 NOTE — ED Notes (Signed)
Patient now up, awake after updating VS Patient denies complaints or needs at this time--in NAD Patient ambulatory to bathroom without assistance from nursing staff

## 2013-12-07 NOTE — BH Assessment (Signed)
Tele Assessment Note   [name] is a 58 y.o. female who voluntarily presents to [facility] for medical clearance.  Upon arrival to the emerg dept, pt requested to be seen for SI thoughts and alcoholism.  Pt told medical staff that she didn't care anymore and has given up.  Pt drank alcohol and took 10 Celexa tabs.  Pt told this writer that she was not SI and did know why she took the pills. Pt denies any hx of SI attempts or psych admissions.  Pt reports stressors: (1) work related stress and (2) father is terminally ill with cancer.  Pt denied SI to this writer, stating--"I don't why I took 10 celexa, I'd had enough and it's time to do something".  Pt admits she has been drinking excessively for 1 yr and started drinking when she was 58 yrs old.  Pt says she drinks 6-11 12oz beers, daily, last drink was 12/06/13.  Pt consumed 6-7 beers.  She has past inpt hx with [facility] for detox 2012, 2013.  Pt did not endorse any depressive sxs, however she has a flat affect and was somewhat tearful during interview.  She also says that she isolates from family and friends after drinking because she doesn't want them to see her "that way".  Per medical staff, pt was apathetic and didn't care if she lived or died but didn't have an active plan to harm self.  Pt has not current outpatient services with a therapist or psych for support.     Axis I: Major depressive disorder, Single episode, Severe; Alcohol use disorder, Moderate Axis II: Deferred Axis III:  Past Medical History  Diagnosis Date  . Hypertension   . Depression   . Suicidal overdose   . Alcoholism    Axis IV: occupational problems, other psychosocial or environmental problems, problems related to social environment and problems with primary support group Axis V: 31-40 impairment in reality testing  Past Medical History:  Past Medical History  Diagnosis Date  . Hypertension   . Depression   . Suicidal overdose   . Alcoholism     Past Surgical  History  Procedure Laterality Date  . Fracture surgery      fractured left ankle 2007  . Ganglion cyst excision      left foot    Family History: History reviewed. No pertinent family history.  Social History:  reports that she has been smoking Cigarettes.  She has a 7.5 pack-year smoking history. She has never used smokeless tobacco. She reports that she drinks about 33 ounces of alcohol per week. She reports that she does not use illicit drugs.  Additional Social History:  Alcohol / Drug Use Pain Medications: See MAR  Prescriptions: See MAR  Over the Counter: See MAR  History of alcohol / drug use?: Yes Longest period of sobriety (when/how long): Only when in detox  Negative Consequences of Use: Work / School;Personal relationships;Financial Withdrawal Symptoms: Other (Comment) (Headache ) Substance #1 Name of Substance 1: Alcohol  1 - Age of First Use: 50's  1 - Amount (size/oz): 6-11 12oz Beers 1 - Frequency: Daily  1 - Duration: On-going  1 - Last Use / Amount: 12/06/13  CIWA: CIWA-Ar BP: 96/65 mmHg Pulse Rate: 74 Nausea and Vomiting: no nausea and no vomiting Tactile Disturbances: none Tremor: no tremor Auditory Disturbances: not present Paroxysmal Sweats: no sweat visible Visual Disturbances: not present Anxiety: no anxiety, at ease Headache, Fullness in Head: none present Agitation: normal activity  Orientation and Clouding of Sensorium: oriented and can do serial additions CIWA-Ar Total: 0 COWS:    Allergies: No Known Allergies  Home Medications:  (Not in a hospital admission)  OB/GYN Status:  No LMP recorded. Patient is postmenopausal.  General Assessment Data Location of Assessment: [facility] ED Is this a Tele or Face-to-Face Assessment?: Face-to-Face Is this an Initial Assessment or a Re-assessment for this encounter?: Initial Assessment Living Arrangements: Alone Can pt return to current living arrangement?: Yes Admission Status: Voluntary Is patient  capable of signing voluntary admission?: Yes Transfer from: [facility] Referral Source: MD  Medical Screening Exam ([place]) Medical Exam completed: No Reason for MSE not completed: Other: (None )  [place] Living Arrangements: Alone Name of Psychiatrist: None  Name of Therapist: None   Education Status Is patient currently in school?: No Current Grade: None  Highest grade of school patient has completed: None  Name of school: None  Contact person: None   Risk to self Suicidal Ideation: No-Not Currently/Within Last 6 Months Suicidal Intent: No-Not Currently/Within Last 6 Months Is patient at risk for suicide?: Yes Suicidal Plan?: No-Not Currently/Within Last 6 Months Access to Means: Yes Specify Access to Suicidal Means: Pills, sharps  What has been your use of drugs/alcohol within the last 12 months?: Abusing: alcohol  Previous Attempts/Gestures: No How many times?: 0 Other Self Harm Risks: None  Triggers for Past Attempts: None known Intentional Self Injurious Behavior: None Family Suicide History: No Recent stressful life event(s): Other (Comment);Trauma (Comment) (Work relsted stress and father is terminally ill with cancer) Persecutory voices/beliefs?: No Depression: Yes Depression Symptoms: Loss of interest in usual pleasures;Feeling worthless/self pity;Isolating Substance abuse history and/or treatment for substance abuse?: Yes Suicide prevention information given to non-admitted patients: Not applicable  Risk to Others Homicidal Ideation: No Thoughts of Harm to Others: No Current Homicidal Intent: No Current Homicidal Plan: No Access to Homicidal Means: No Identified Victim: None  History of harm to others?: No Assessment of Violence: None Noted Violent Behavior Description: None  Does patient have access to weapons?: No Criminal Charges Pending?: No Does patient have a court date: No  Psychosis Hallucinations: None  noted Delusions: None noted  Mental Status Report Appear/Hygiene: In scrubs Eye Contact: Fair Motor Activity: Unremarkable Speech: Logical/coherent;Soft Level of Consciousness: Alert Mood: Depressed;Ashamed/humiliated Affect: Appropriate to circumstance;Depressed;Flat Anxiety Level: None Thought Processes: Coherent;Relevant Judgement: Unimpaired Orientation: Person;Place;Time;Situation Obsessive Compulsive Thoughts/Behaviors: None  Cognitive Functioning Concentration: Normal Memory: Recent Intact;Remote Intact IQ: Average Insight: Fair Impulse Control: Poor Appetite: Fair Weight Loss: 0 Weight Gain: 0 Sleep: Decreased Total Hours of Sleep: 5 Vegetative Symptoms: None  ADLScreening [facility] Assessment Services) Patient's cognitive ability adequate to safely complete daily activities?: Yes Patient able to express need for assistance with ADLs?: Yes Independently performs ADLs?: Yes (appropriate for developmental age)  Prior Inpatient Therapy Prior Inpatient Therapy: Yes Prior Therapy Dates: 2012,2013 Prior Therapy Facilty/Provider(s): [facility]  Reason for Treatment: Detox   Prior Outpatient Therapy Prior Outpatient Therapy: No Prior Therapy Dates: None  Prior Therapy Facilty/Provider(s): None  Reason for Treatment: None   ADL Screening (condition at time of admission) Patient's cognitive ability adequate to safely complete daily activities?: Yes Is the patient deaf or have difficulty hearing?: No Does the patient have difficulty seeing, even when wearing glasses/contacts?: No Does the patient have difficulty concentrating, remembering, or making decisions?: Yes Patient able to express need for assistance with ADLs?: Yes Does the patient have difficulty dressing or bathing?: No Independently performs  ADLs?: Yes (appropriate for developmental age) Does the patient have difficulty walking or climbing stairs?: No Weakness of Legs: None Weakness of Arms/Hands: None  Home  Assistive Devices/Equipment Home Assistive Devices/Equipment: Eyeglasses  Therapy Consults (therapy consults require a physician order) PT Evaluation Needed: No OT Evalulation Needed: No SLP Evaluation Needed: No Abuse/Neglect Assessment (Assessment to be complete while patient is alone) Physical Abuse: Denies Verbal Abuse: Denies Sexual Abuse: Denies Exploitation of patient/patient's resources: Denies Self-Neglect: Denies Values / Beliefs Cultural Requests During Hospitalization: None Spiritual Requests During Hospitalization: None Consults Spiritual Care Consult Needed: No Social Work Consult Needed: No Regulatory affairs officer (For Healthcare) Advance Directive: Patient does not have advance directive;Patient would not like information Pre-existing out of facility DNR order (yellow form or pink MOST form): No Nutrition Screen- [facility] Adult/[facility] Patient's home diet: Regular  Additional Information 1:1 In Past 12 Months?: No CIRT Risk: No Elopement Risk: No Does patient have medical clearance?: Yes     Disposition:  Disposition Initial Assessment Completed for this Encounter: Yes Disposition of Patient: Inpatient treatment program;Referred to (Accepted by Patriciaann Clan, PA when 300 bed is avail ) Type of inpatient treatment program: Adult Patient referred to: Other (Comment) (Accepted by Patriciaann Clan, PA when 300 bed is avail )  Girtha Rm 12/07/2013 5:31 AM

## 2013-12-07 NOTE — Progress Notes (Signed)
Pt was given AA meeting schedule and "Stress Interview" packet for psycho-educational materials.

## 2013-12-07 NOTE — Progress Notes (Signed)
Patient ID: [name], female   DOB: [birth year], 58 y.o.   MRN: [id number] Pt alert and oriented. Denies SI/HI, -A/[place] verbally contracts for safety. Will continue to monitor closely for evaluation and stabilization.

## 2013-12-08 MED ORDER — CITALOPRAM HYDROBROMIDE 20 MG PO TABS: 20.0000 mg | ORAL_TABLET | Freq: Every day | ORAL | Status: DC

## 2013-12-08 NOTE — BHH Suicide Risk Assessment (Cosign Needed)
Suicide Risk Assessment  Discharge Assessment     Nursing information obtained from:  Patient Demographic factors:  Divorced or widowed;Caucasian;Living alone Current Mental Status:  NA Loss Factors:  Loss of significant relationship Historical Factors:  Family history of mental illness or substance abuse Risk Reduction Factors:  Responsible for children under 56 years of age;Sense of responsibility to family;Positive social support;Positive coping skills or problem solving skills Total Time spent with patient: 25 Minutes  CLINICAL FACTORS:   Depression:   Anhedonia Comorbid alcohol abuse/dependence Impulsivity Insomnia Alcohol/Substance Abuse/Dependencies  Psychiatric Specialty Exam:     Blood pressure 115/80, pulse 70, temperature 97.9 F (36.6 C), temperature source Oral, resp. rate 16, SpO2 98.00%.There is no weight on file to calculate BMI.   General Appearance: Fairly Groomed  Engineer, water::  Good  Speech:  Clear and Coherent  Volume:  Normal  Mood:  Euthymic  Affect:  Appropriate  Thought Process:  Coherent and Goal Directed  Orientation:  Full (Time, Place, and Person)  Thought Content:  WDL  Suicidal Thoughts:  No  Homicidal Thoughts:  No  Memory:  Immediate;   Fair Recent;   Fair Remote;   Fair  Judgement:  Fair  Insight:  Good  Psychomotor Activity:  Normal  Concentration:  Good  Recall:  Good  Fund of Knowledge:Good  Language: Good  Akathisia:  No  Handed:  Right  AIMS (if indicated):     Assets:  Communication Skills Desire for Improvement Resilience Social Support  Sleep:      Musculoskeletal: Strength & Muscle Tone: within normal limits Gait & Station: normal Patient leans: N/A     COGNITIVE FEATURES THAT CONTRIBUTE TO RISK:     Polarized thinking    SUICIDE RISK:   Minimal: No identifiable suicidal ideation.  Patients presenting with no risk factors but with morbid ruminations; may be classified as minimal risk based on the severity of  the depressive symptoms  PLAN OF CARE: -Discharge home with outpatient resources for alcohol detox as provided by [facility] TTS.   Benjamine Mola, FNP-BC 12/08/2013, 12:53 PM

## 2013-12-08 NOTE — BH Assessment (Signed)
[facility] Assessment Progress Note  After consulting with Hampton Abbot, MD it has been determined that pt does not present a life threatening danger to herself or others, and that psychiatric hospitalization is not indicated for her at this time.  Pt expressed a preference for following up with Alcoholics Anonymous, along with a group hosted by her church.  I then spoke to the pt; she confirms that these are her preferences.  She asked if she would be discharged from the Observation Unit with any prescriptions, with a particular desire for Librium.  I told her that she probably would need to follow up with an outpatient provider for these needs, but that I would ask Dr Dwyane Dee.  The pt reports that she is not interested in referrals to outpatient psychiatrists.  I then called Dr Dwyane Dee, who confirms that she will not be prescribing Librium for the pt, and that pt will need to follow up with an outpatient provider for any psychotropic medication needs.  Discharge instructions will include recommendation that she follow up with Alcoholics Anonymous and her church based group, as well as her PCP for any ongoing psychotropic medication needs.  Pt reports that her PCP prescribes Celexa for her currently.  She was offered the option of signing Consent to Release Information to any outpatient providers, but declined to do so.  Jalene Mullet, MA Triage Specialist 12/08/2013 @ 11:07

## 2013-12-08 NOTE — Discharge Summary (Signed)
Patient seen, evaluated by me. Treatment plan formulated by me. Patient is ssable and can be discharged home.

## 2013-12-08 NOTE — Discharge Summary (Signed)
[place] OBS UNIT DISCHARGE SUMMARY  Patient Identification:  [name] Date of Evaluation:  12/08/2013 Chief Complaint:  ALCOHOL USE DISORDER, MODERATE MAJOR DEPRESSIVE DISORDER,SINGLE EPISODE  Subjective: Pt seen and chart reviewed. Pt sent to OBS unit from [facility] for detox/stabilization and proper determination of disposition. Pt denies SI, HI, and AVH, contracts for safety. Pt has a plan to followup with outpatient resources and also with resources given to her by her church. Tonette Bihari with TTS from [facility] is assisting pt with this planning. Pt states that she feels much better today and is optimistic about her treatment.   History of Present Illness:: [name] is a 58 y.o. female who voluntarily presents to [facility] for medical clearance. Upon arrival to the emerg dept, pt requested to be seen for SI thoughts and alcoholism. Pt told medical staff that she didn't care anymore and has given up. Pt drank alcohol and took 10 Celexa tabs. Pt told this writer that she was not SI and did know why she took the pills. Pt denies any hx of SI attempts or psych admissions. Pt reports stressors: (1) work related stress and (2) father is terminally ill with cancer. Pt denied SI to this writer, stating--"I don't why I took 10 celexa, I'd had enough and it's time to do something". Pt admits she has been drinking excessively for 1 yr and started drinking when she was 58 yrs old. Pt says she drinks 6-11 12oz beers, daily, last drink was 12/06/13. Pt consumed 6-7 beers. She has past inpt hx with [facility] for detox 2012, 2013. Pt did not endorse any depressive sxs, however she has a flat affect and was somewhat tearful during interview. She also says that she isolates from family and friends after drinking because she doesn't want them to see her "that way". Per medical staff, pt was apathetic and didn't care if she lived or died but didn't have an active plan to harm self. Pt has not current outpatient services with a  therapist or psych for support.   Total Time spent with patient: 40 minutes  Psychiatric Specialty Exam: Physical Exam Full Physical Exam performed in ED; reviewed, stable, and I concur with this assessment.   Review of Systems  Constitutional: Negative.   HENT: Negative.   Eyes: Negative.   Respiratory: Negative.   Cardiovascular: Negative.   Gastrointestinal: Negative.   Genitourinary: Negative.   Musculoskeletal: Negative.   Skin: Negative.   Neurological: Negative.   Endo/Heme/Allergies: Negative.   Psychiatric/Behavioral: Positive for depression and substance abuse. The patient is nervous/anxious.     Blood pressure 115/80, pulse 70, temperature 97.9 F (36.6 C), temperature source Oral, resp. rate 16, SpO2 98.00%.There is no weight on file to calculate BMI.  General Appearance: Fairly Groomed  Engineer, water::  Good  Speech:  Clear and Coherent  Volume:  Normal  Mood:  Euthymic  Affect:  Appropriate  Thought Process:  Coherent and Goal Directed  Orientation:  Full (Time, Place, and Person)  Thought Content:  WDL  Suicidal Thoughts:  No  Homicidal Thoughts:  No  Memory:  Immediate;   Fair Recent;   Fair Remote;   Fair  Judgement:  Fair  Insight:  Good  Psychomotor Activity:  Normal  Concentration:  Good  Recall:  Good  Fund of Knowledge:Good  Language: Good  Akathisia:  No  Handed:  Right  AIMS (if indicated):     Assets:  Communication Skills Desire for Improvement Resilience Social Support  Sleep:  Musculoskeletal: Strength & Muscle Tone: within normal limits Gait & Station: normal Patient leans: N/A   Past Medical History:   Past Medical History  Diagnosis Date  . Hypertension   . Depression   . Suicidal overdose   . Alcoholism    Cardiac History:  Hypertension Allergies:  No Known Allergies PTA Medications: Prescriptions prior to admission  Medication Sig Dispense Refill  . amLODipine (NORVASC) 10 MG tablet Take 1 tablet (10 mg total)  by mouth daily. For high blood pressure      . [DISCONTINUED] citalopram (CELEXA) 20 MG tablet Take 20 mg by mouth daily.        Previous Psychotropic Medications:  Medication/Dose  SEE MAR (celexa)               Substance Abuse History in the last 12 months:  yes  Consequences of Substance Abuse: Medical Consequences:  Hospitalization  Social History:  reports that she has been smoking Cigarettes.  She has a 7.5 pack-year smoking history. She has never used smokeless tobacco. She reports that she drinks about 33 ounces of alcohol per week. She reports that she does not use illicit drugs. Additional Social History:  Family History:  No family history on file.  Psychological Evaluations:  Assessment:   DSM5:  Substance/Addictive Disorders:  Alcohol Related Disorder - Severe (303.90) Depressive Disorders:  Major Depressive Disorder - Severe (296.23)  AXIS I:  Alcohol Abuse, Major Depression, Recurrent severe and Substance Induced Mood Disorder AXIS II:  Deferred AXIS III:   Past Medical History  Diagnosis Date  . Hypertension   . Depression   . Suicidal overdose   . Alcoholism    AXIS IV:  other psychosocial or environmental problems and problems related to social environment AXIS V:  61-70 mild symptoms  Treatment Plan/Recommendations:   Review of chart, vital signs, medications, and notes.  1-Medication management for depression and anxiety: Medications reviewed with the patient and she stated no untoward effects, unchanged. 2-Coping skills for depression, anxiety  3-Continue crisis stabilization and management  4-Address health issues--monitoring vital signs, stable  5-Treatment plan in progress to prevent relapse of depression and anxiety  Treatment Plan Summary: Daily contact with patient to assess and evaluate symptoms and progress in treatment Medication management Current Medications:  Current Facility-Administered Medications  Medication Dose Route  Frequency Provider Last Rate Last Dose  . acetaminophen (TYLENOL) tablet 650 mg  650 mg Oral Q6H PRN Shuvon Rankin, NP      . alum & mag hydroxide-simeth (MAALOX/MYLANTA) 200-200-20 MG/5ML suspension 30 mL  30 mL Oral Q4H PRN Shuvon Rankin, NP      . amLODipine (NORVASC) tablet 10 mg  10 mg Oral Daily Shuvon Rankin, NP   10 mg at 12/08/13 0825  . chlordiazePOXIDE (LIBRIUM) capsule 25 mg  25 mg Oral Q6H PRN Shuvon Rankin, NP      . chlordiazePOXIDE (LIBRIUM) capsule 25 mg  25 mg Oral TID Shuvon Rankin, NP   25 mg at 12/08/13 0825   Followed by  . [START ON 12/09/2013] chlordiazePOXIDE (LIBRIUM) capsule 25 mg  25 mg Oral BH-qamhs Shuvon Rankin, NP       Followed by  . [START ON 12/10/2013] chlordiazePOXIDE (LIBRIUM) capsule 25 mg  25 mg Oral Daily Shuvon Rankin, NP      . citalopram (CELEXA) tablet 20 mg  20 mg Oral Daily Shuvon Rankin, NP   20 mg at 12/08/13 0825  . hydrOXYzine (ATARAX/VISTARIL) tablet 25 mg  25 mg Oral  Q6H PRN Shuvon Rankin, NP      . loperamide (IMODIUM) capsule 2-4 mg  2-4 mg Oral PRN Shuvon Rankin, NP      . magnesium hydroxide (MILK OF MAGNESIA) suspension 30 mL  30 mL Oral Daily PRN Shuvon Rankin, NP      . multivitamin with minerals tablet 1 tablet  1 tablet Oral Daily Shuvon Rankin, NP   1 tablet at 12/08/13 0825  . ondansetron (ZOFRAN-ODT) disintegrating tablet 4 mg  4 mg Oral Q6H PRN Shuvon Rankin, NP      . thiamine (VITAMIN B-1) tablet 100 mg  100 mg Oral Daily Benjamine Mola, FNP   100 mg at 12/08/13 0825    Benjamine Mola, FNP-BC  12/08/2013 12:48 PM

## 2013-12-08 NOTE — Consult Note (Signed)
Face to face evaluation and I agree with this note 

## 2013-12-08 NOTE — H&P (Signed)
[place] OBS UNIT H&P  Patient Identification:  [name] Date of Evaluation:  12/08/2013 Chief Complaint:  ALCOHOL USE DISORDER, MODERATE MAJOR DEPRESSIVE DISORDER,SINGLE EPISODE  Subjective: Pt seen and chart reviewed. Pt sent to OBS unit from [facility] for detox/stabilization and proper determination of disposition. Pt denies SI, HI, and AVH, contracts for safety. Pt is visibly depressed and anxious and reports a strong desire to detox from alcohol. Pt reports that she does have 6 beers on average, daily and drinks "a lot on the weekends if no one is there to stop me". Pt has had the same job for 25 years and wants to do something outpatient. She mentioned a program with her church and is willing to speak with social work first thing tomorrow morning to determine the most appropriate outpatient treatment program.   History of Present Illness:: [name] is a 58 y.o. female who voluntarily presents to [facility] for medical clearance. Upon arrival to the emerg dept, pt requested to be seen for SI thoughts and alcoholism. Pt told medical staff that she didn't care anymore and has given up. Pt drank alcohol and took 10 Celexa tabs. Pt told this writer that she was not SI and did know why she took the pills. Pt denies any hx of SI attempts or psych admissions. Pt reports stressors: (1) work related stress and (2) father is terminally ill with cancer. Pt denied SI to this writer, stating--"I don't why I took 10 celexa, I'd had enough and it's time to do something". Pt admits she has been drinking excessively for 1 yr and started drinking when she was 58 yrs old. Pt says she drinks 6-11 12oz beers, daily, last drink was 12/06/13. Pt consumed 6-7 beers. She has past inpt hx with [facility] for detox 2012, 2013. Pt did not endorse any depressive sxs, however she has a flat affect and was somewhat tearful during interview. She also says that she isolates from family and friends after drinking because she doesn't want them  to see her "that way". Per medical staff, pt was apathetic and didn't care if she lived or died but didn't have an active plan to harm self. Pt has not current outpatient services with a therapist or psych for support.   Total Time spent with patient: 40 minutes  Psychiatric Specialty Exam: Physical Exam Full Physical Exam performed in ED; reviewed, stable, and I concur with this assessment.   Review of Systems  Constitutional: Negative.   HENT: Negative.   Eyes: Negative.   Respiratory: Negative.   Cardiovascular: Negative.   Gastrointestinal: Negative.   Genitourinary: Negative.   Musculoskeletal: Negative.   Skin: Negative.   Neurological: Negative.   Endo/Heme/Allergies: Negative.   Psychiatric/Behavioral: Positive for depression and substance abuse. The patient is nervous/anxious.     Blood pressure 115/80, pulse 70, temperature 97.9 F (36.6 C), temperature source Oral, resp. rate 16, SpO2 98.00%.There is no weight on file to calculate BMI.  General Appearance: Fairly Groomed  Engineer, water::  Good  Speech:  Clear and Coherent  Volume:  Decreased  Mood:  Anxious and Depressed  Affect:  Depressed  Thought Process:  Coherent and Goal Directed  Orientation:  Full (Time, Place, and Person)  Thought Content:  WDL  Suicidal Thoughts:  No  Homicidal Thoughts:  No  Memory:  Immediate;   Fair Recent;   Fair Remote;   Fair  Judgement:  Fair  Insight:  Good  Psychomotor Activity:  Normal  Concentration:  Good  Recall:  Roel Cluck of Knowledge:Good  Language: Good  Akathisia:  No  Handed:  Right  AIMS (if indicated):     Assets:  Communication Skills Desire for Improvement Resilience Social Support  Sleep:       Musculoskeletal: Strength & Muscle Tone: within normal limits Gait & Station: normal Patient leans: N/A   Past Medical History:   Past Medical History  Diagnosis Date  . Hypertension   . Depression   . Suicidal overdose   . Alcoholism    Cardiac  History:  Hypertension Allergies:  No Known Allergies PTA Medications: Prescriptions prior to admission  Medication Sig Dispense Refill  . amLODipine (NORVASC) 10 MG tablet Take 1 tablet (10 mg total) by mouth daily. For high blood pressure      . [DISCONTINUED] citalopram (CELEXA) 20 MG tablet Take 20 mg by mouth daily.        Previous Psychotropic Medications:  Medication/Dose  SEE MAR (celexa)               Substance Abuse History in the last 12 months:  yes  Consequences of Substance Abuse: Medical Consequences:  Hospitalization  Social History:  reports that she has been smoking Cigarettes.  She has a 7.5 pack-year smoking history. She has never used smokeless tobacco. She reports that she drinks about 33 ounces of alcohol per week. She reports that she does not use illicit drugs. Additional Social History:                        Family History:  No family history on file.  Results for orders placed during the hospital encounter of 12/06/13 (from the past 72 hour(s))  CBC     Status: None   Collection Time    12/06/13  9:27 PM      Result Value Ref Range   WBC 6.0  4.0 - 10.5 K/uL   Comment: WHITE COUNT CONFIRMED ON SMEAR     RARE NRBCs     REPEATED TO VERIFY   RBC 4.89  3.87 - 5.11 MIL/uL   Hemoglobin 14.9  12.0 - 15.0 g/dL   HCT 43.3  36.0 - 46.0 %   MCV 88.5  78.0 - 100.0 fL   MCH 30.5  26.0 - 34.0 pg   MCHC 34.4  30.0 - 36.0 g/dL   RDW 12.4  11.5 - 15.5 %   Platelets 360  150 - 400 K/uL  COMPREHENSIVE METABOLIC PANEL     Status: Abnormal   Collection Time    12/06/13  9:27 PM      Result Value Ref Range   Sodium 140  137 - 147 mEq/L   Potassium 3.7  3.7 - 5.3 mEq/L   Chloride 101  96 - 112 mEq/L   CO2 22  19 - 32 mEq/L   Glucose, Bld 87  70 - 99 mg/dL   BUN 10  6 - 23 mg/dL   Creatinine, Ser 0.59  0.50 - 1.10 mg/dL   Calcium 9.2  8.4 - 10.5 mg/dL   Total Protein 7.9  6.0 - 8.3 g/dL   Albumin 4.5  3.5 - 5.2 g/dL   AST 36  0 - 37 U/L    ALT 61 (*) 0 - 35 U/L   Alkaline Phosphatase 99  39 - 117 U/L   Total Bilirubin 0.2 (*) 0.3 - 1.2 mg/dL   GFR calc non Af Amer >90  >90 mL/min  GFR calc Af Amer >90  >90 mL/min   Comment: (NOTE)     The eGFR has been calculated using the CKD EPI equation.     This calculation has not been validated in all clinical situations.     eGFR's persistently <90 mL/min signify possible Chronic Kidney     Disease.  ETHANOL     Status: Abnormal   Collection Time    12/06/13  9:27 PM      Result Value Ref Range   Alcohol, Ethyl (B) 164 (*) 0 - 11 mg/dL   Comment:            LOWEST DETECTABLE LIMIT FOR     SERUM ALCOHOL IS 11 mg/dL     FOR MEDICAL PURPOSES ONLY  ACETAMINOPHEN LEVEL     Status: None   Collection Time    12/06/13  9:27 PM      Result Value Ref Range   Acetaminophen (Tylenol), Serum <15.0  10 - 30 ug/mL   Comment:            THERAPEUTIC CONCENTRATIONS VARY     SIGNIFICANTLY. A RANGE OF 10-30     ug/mL MAY BE AN EFFECTIVE     CONCENTRATION FOR MANY PATIENTS.     HOWEVER, SOME ARE BEST TREATED     AT CONCENTRATIONS OUTSIDE THIS     RANGE.     ACETAMINOPHEN CONCENTRATIONS     >150 ug/mL AT 4 HOURS AFTER     INGESTION AND >50 ug/mL AT 12     HOURS AFTER INGESTION ARE     OFTEN ASSOCIATED WITH TOXIC     REACTIONS.  SALICYLATE LEVEL     Status: Abnormal   Collection Time    12/06/13  9:27 PM      Result Value Ref Range   Salicylate Lvl <6.2 (*) 2.8 - 20.0 mg/dL  URINE RAPID DRUG SCREEN (HOSP PERFORMED)     Status: None   Collection Time    12/06/13  9:27 PM      Result Value Ref Range   Opiates NONE DETECTED  NONE DETECTED   Cocaine NONE DETECTED  NONE DETECTED   Benzodiazepines NONE DETECTED  NONE DETECTED   Amphetamines NONE DETECTED  NONE DETECTED   Tetrahydrocannabinol NONE DETECTED  NONE DETECTED   Barbiturates NONE DETECTED  NONE DETECTED   Comment:            DRUG SCREEN FOR MEDICAL PURPOSES     ONLY.  IF CONFIRMATION IS NEEDED     FOR ANY PURPOSE, NOTIFY  LAB     WITHIN 5 DAYS.                LOWEST DETECTABLE LIMITS     FOR URINE DRUG SCREEN     Drug Class       Cutoff (ng/mL)     Amphetamine      1000     Barbiturate      200     Benzodiazepine   563     Tricyclics       893     Opiates          300     Cocaine          300     THC              50   Psychological Evaluations:  Assessment:   DSM5:  Substance/Addictive Disorders:  Alcohol Related Disorder - Severe (303.90) Depressive  Disorders:  Major Depressive Disorder - Severe (296.23)  AXIS I:  Alcohol Abuse, Major Depression, Recurrent severe and Substance Induced Mood Disorder AXIS II:  Deferred AXIS III:   Past Medical History  Diagnosis Date  . Hypertension   . Depression   . Suicidal overdose   . Alcoholism    AXIS IV:  other psychosocial or environmental problems and problems related to social environment AXIS V:  41-50 serious symptoms  Treatment Plan/Recommendations:   Review of chart, vital signs, medications, and notes.  1-Medication management for depression and anxiety: Medications reviewed with the patient and she stated no untoward effects, unchanged. 2-Coping skills for depression, anxiety  3-Continue crisis stabilization and management  4-Address health issues--monitoring vital signs, stable  5-Treatment plan in progress to prevent relapse of depression and anxiety  Treatment Plan Summary: Daily contact with patient to assess and evaluate symptoms and progress in treatment Medication management Current Medications:  Current Facility-Administered Medications  Medication Dose Route Frequency Provider Last Rate Last Dose  . acetaminophen (TYLENOL) tablet 650 mg  650 mg Oral Q6H PRN Shuvon Rankin, NP      . alum & mag hydroxide-simeth (MAALOX/MYLANTA) 200-200-20 MG/5ML suspension 30 mL  30 mL Oral Q4H PRN Shuvon Rankin, NP      . amLODipine (NORVASC) tablet 10 mg  10 mg Oral Daily Shuvon Rankin, NP   10 mg at 12/08/13 0825  . chlordiazePOXIDE  (LIBRIUM) capsule 25 mg  25 mg Oral Q6H PRN Shuvon Rankin, NP      . chlordiazePOXIDE (LIBRIUM) capsule 25 mg  25 mg Oral TID Shuvon Rankin, NP   25 mg at 12/08/13 0825   Followed by  . [START ON 12/09/2013] chlordiazePOXIDE (LIBRIUM) capsule 25 mg  25 mg Oral BH-qamhs Shuvon Rankin, NP       Followed by  . [START ON 12/10/2013] chlordiazePOXIDE (LIBRIUM) capsule 25 mg  25 mg Oral Daily Shuvon Rankin, NP      . citalopram (CELEXA) tablet 20 mg  20 mg Oral Daily Shuvon Rankin, NP   20 mg at 12/08/13 0825  . hydrOXYzine (ATARAX/VISTARIL) tablet 25 mg  25 mg Oral Q6H PRN Shuvon Rankin, NP      . loperamide (IMODIUM) capsule 2-4 mg  2-4 mg Oral PRN Shuvon Rankin, NP      . magnesium hydroxide (MILK OF MAGNESIA) suspension 30 mL  30 mL Oral Daily PRN Shuvon Rankin, NP      . multivitamin with minerals tablet 1 tablet  1 tablet Oral Daily Shuvon Rankin, NP   1 tablet at 12/08/13 0825  . ondansetron (ZOFRAN-ODT) disintegrating tablet 4 mg  4 mg Oral Q6H PRN Shuvon Rankin, NP      . thiamine (VITAMIN B-1) tablet 100 mg  100 mg Oral Daily Benjamine Mola, FNP   100 mg at 12/08/13 0825     Benjamine Mola, FNP-BC 12/07/2013 3:48 PM

## 2013-12-08 NOTE — Discharge Instructions (Signed)
Twelve-Step groups, such as Alcoholics Anonymous, may be very helpful in supporting a sober lifestyle.  It is recommended that you follow through with your intended plan to attend Alcoholics Anonymous meetings.  Refer to the printed information provided by the Observation Unit staff to find a meeting today, Wednesday 12/08/2013, in your area.  For other daily meetings, visit the [facility] [place] website:  [email or web address].[email or web address]  You have also indicated that you intend to follow up with a support group hosted by [employer].  For medication needs, please consult with your primary care doctor.  If you decide that you would like to see a psychiatrist to manage your psychiatric medication needs, you may call the [facility] in [place] at [phone] to schedule an appointment.

## 2013-12-08 NOTE — Progress Notes (Signed)
Patient ID: [name], female   DOB: 01-01-1956, 58 y.o.   MRN: [id number] D:  Patient discharged to home.  All belongings retrieved from locker.  Denies suicidal ideation.  A:  Reviewed all discharge instructions, medications, and follow up care.  Patient escorted to locker room and out to front lobby. R:  Patient states she is ready for discharge.  States she will follow up with AA and with a group that is starting up at her church.  Son came in to pick patient up.

## 2013-12-09 NOTE — H&P (Signed)
Case discussed, and agree with plan

## 2013-12-10 ENCOUNTER — Ambulatory Visit: Payer: Self-pay

## 2013-12-10 ENCOUNTER — Ambulatory Visit
Admission: RE | Admit: 2013-12-10 | Discharge: 2013-12-10 | Disposition: A | Discharge status: Home / Self Care | Payer: BC Managed Care – PPO | Source: Ambulatory Visit | Attending: Obstetrics and Gynecology | Admitting: Obstetrics and Gynecology

## 2013-12-10 ENCOUNTER — Ambulatory Visit
Admission: RE | Admit: 2013-12-10 | Discharge: 2013-12-10 | Disposition: A | Discharge status: Home / Self Care | Payer: BC Managed Care – PPO | Source: Ambulatory Visit

## 2013-12-10 DIAGNOSIS — Z1231 Encounter for screening mammogram for malignant neoplasm of breast: Secondary | ICD-10-CM

## 2013-12-10 DIAGNOSIS — Z780 Asymptomatic menopausal state: Secondary | ICD-10-CM

## 2013-12-15 ENCOUNTER — Other Ambulatory Visit: Payer: Self-pay | Admitting: Obstetrics and Gynecology

## 2013-12-15 DIAGNOSIS — R928 Other abnormal and inconclusive findings on diagnostic imaging of breast: Secondary | ICD-10-CM

## 2013-12-22 ENCOUNTER — Ambulatory Visit
Admission: RE | Admit: 2013-12-22 | Discharge: 2013-12-22 | Disposition: A | Discharge status: Home / Self Care | Payer: BC Managed Care – PPO | Source: Ambulatory Visit | Attending: Obstetrics and Gynecology | Admitting: Obstetrics and Gynecology

## 2013-12-22 DIAGNOSIS — R928 Other abnormal and inconclusive findings on diagnostic imaging of breast: Secondary | ICD-10-CM

## 2014-03-27 ENCOUNTER — Encounter (HOSPITAL_COMMUNITY): Payer: Self-pay | Admitting: Emergency Medicine

## 2014-03-27 ENCOUNTER — Emergency Department (HOSPITAL_COMMUNITY)
Admission: EM | Admit: 2014-03-27 | Discharge: 2014-03-28 | Disposition: A | Discharge status: Other Acute Inpatient Hospital | Payer: BC Managed Care – PPO | Attending: Emergency Medicine | Admitting: Emergency Medicine

## 2014-03-27 DIAGNOSIS — F1994 Other psychoactive substance use, unspecified with psychoactive substance-induced mood disorder: Secondary | ICD-10-CM

## 2014-03-27 DIAGNOSIS — Z79899 Other long term (current) drug therapy: Secondary | ICD-10-CM | POA: Insufficient documentation

## 2014-03-27 DIAGNOSIS — F4321 Adjustment disorder with depressed mood: Secondary | ICD-10-CM

## 2014-03-27 DIAGNOSIS — F3289 Other specified depressive episodes: Secondary | ICD-10-CM | POA: Diagnosis not present

## 2014-03-27 DIAGNOSIS — F1010 Alcohol abuse, uncomplicated: Secondary | ICD-10-CM | POA: Diagnosis not present

## 2014-03-27 DIAGNOSIS — I10 Essential (primary) hypertension: Secondary | ICD-10-CM | POA: Diagnosis not present

## 2014-03-27 DIAGNOSIS — F17200 Nicotine dependence, unspecified, uncomplicated: Secondary | ICD-10-CM | POA: Insufficient documentation

## 2014-03-27 DIAGNOSIS — F329 Major depressive disorder, single episode, unspecified: Secondary | ICD-10-CM | POA: Diagnosis not present

## 2014-03-27 DIAGNOSIS — R45851 Suicidal ideations: Secondary | ICD-10-CM | POA: Diagnosis present

## 2014-03-27 DIAGNOSIS — F10229 Alcohol dependence with intoxication, unspecified: Secondary | ICD-10-CM

## 2014-03-27 LAB — CBC WITH DIFFERENTIAL/PLATELET
Basophils Absolute: 0.1 10*3/uL (ref 0.0–0.1)
Basophils Relative: 1 % (ref 0–1)
Eosinophils Absolute: 0.1 10*3/uL (ref 0.0–0.7)
Eosinophils Relative: 2 % (ref 0–5)
HCT: 39.5 % (ref 36.0–46.0)
HEMOGLOBIN: 14 g/dL (ref 12.0–15.0)
Lymphocytes Relative: 45 % (ref 12–46)
Lymphs Abs: 2.2 10*3/uL (ref 0.7–4.0)
MCH: 30.6 pg (ref 26.0–34.0)
MCHC: 35.4 g/dL (ref 30.0–36.0)
MCV: 86.4 fL (ref 78.0–100.0)
Monocytes Absolute: 0.4 10*3/uL (ref 0.1–1.0)
Monocytes Relative: 8 % (ref 3–12)
NEUTROS PCT: 44 % (ref 43–77)
Neutro Abs: 2.2 10*3/uL (ref 1.7–7.7)
Platelets: 321 10*3/uL (ref 150–400)
RBC: 4.57 MIL/uL (ref 3.87–5.11)
RDW: 12.4 % (ref 11.5–15.5)
WBC: 4.9 10*3/uL (ref 4.0–10.5)

## 2014-03-27 MED ORDER — SODIUM CHLORIDE 0.9 % IV BOLUS (SEPSIS)
1000.0000 mL | Freq: Once | INTRAVENOUS | Status: AC
Start: 2014-03-28 — End: 2014-03-28
  Administered 2014-03-27: 1000 mL via INTRAVENOUS

## 2014-03-27 MED ORDER — SODIUM CHLORIDE 0.9 % IV SOLN
Freq: Once | INTRAVENOUS | Status: AC
  Administered 2014-03-27: 75 mL/h via INTRAVENOUS

## 2014-03-27 NOTE — ED Notes (Signed)
Pt arrives to ED from home via EMS. Pts father passed on 11-01-22. Pt took 12-20 Celexa and drank 12 pack beer. SI ideation. SR on monitor. 96/42 HR 80 RR 16 96% 2L. GCS 15, pt is lethargic. 18 LW. Hx depression. 400 cc Bolus by EMS.

## 2014-03-27 NOTE — ED Provider Notes (Signed)
CSN: [id number]     Arrival date & time 03/27/14  2321 History   First MD Initiated Contact with Patient 03/27/14 2324     Chief Complaint  Patient presents with  . Suicidal     (Consider location/radiation/quality/duration/timing/severity/associated sxs/prior Treatment) HPI Comments: This is a 58 year old female, with a history of, alcohol abuse.  She states tonight she took between 82 and 20 Celexa in an attempt to "ease the pain."  Due to her father's recent death,   2 days, ago. She lives alone but called her sister for help, after drinking 12-14 beers.  She has been in rehabilitation for her.  Colon is in the past, so we are in her adult life is approximately one month. She does have a history of previous suicide attempt, probably, 2 years ago, with a subsequent hospitalization Since taking the medication, and drinking.  She does denies any nausea, vomiting, diarrhea, fever, or URI symptoms, coughing, headache, shortness of breath, chest pain  The history is provided by the patient.    Past Medical History  Diagnosis Date  . Hypertension   . Depression   . Suicidal overdose   . Alcoholism    Past Surgical History  Procedure Laterality Date  . Fracture surgery      fractured left ankle 2007  . Ganglion cyst excision      left foot   No family history on file. History  Substance Use Topics  . Smoking status: Current Every Day Smoker -- 1.00 packs/day for 5 years    Types: Cigarettes  . Smokeless tobacco: Never Used  . Alcohol Use: 7.2 oz/week    12 Cans of beer per week     Comment: daily 6-7 cans of beer   OB History   Grav Para Term Preterm Abortions TAB SAB Ect Mult Living                 Review of Systems  Constitutional: Negative for fever.  Respiratory: Negative for shortness of breath.   Cardiovascular: Negative for chest pain.  Gastrointestinal: Negative for nausea, vomiting and abdominal pain.  Genitourinary: Negative for dysuria.  Neurological:  Negative for dizziness and headaches.  Psychiatric/Behavioral: Positive for suicidal ideas and self-injury.  All other systems reviewed and are negative.     Allergies  Review of patient's allergies indicates no known allergies.  Home Medications   Prior to Admission medications   Medication Sig Start Date End Date Taking? Authorizing Provider  amLODipine (NORVASC) 10 MG tablet Take 1 tablet (10 mg total) by mouth daily. For high blood pressure 12/12/11  Yes Encarnacion Slates, NP  citalopram (CELEXA) 20 MG tablet Take 1 tablet (20 mg total) by mouth daily. 12/08/13  Yes Elyse Jarvis Withrow, FNP   BP 115/76  Pulse 82  Temp(Src) 98.2 F (36.8 C) (Oral)  Resp 14  Ht 5\' 1"  (1.549 m)  Wt 150 lb (68.04 kg)  BMI 28.36 kg/m2  SpO2 98% Physical Exam  Nursing note and vitals reviewed. Constitutional: She is oriented to person, place, and time. She appears well-developed and well-nourished.  HENT:  Head: Normocephalic.  Eyes: Pupils are equal, round, and reactive to light.  Cardiovascular: Normal rate and regular rhythm.   Pulmonary/Chest: Effort normal.  Musculoskeletal: Normal range of motion.  Neurological: She is alert and oriented to person, place, and time.  Skin: Skin is warm.  Psychiatric: Her speech is normal. She is slowed. Cognition and memory are normal. She expresses inappropriate judgment. She exhibits  a depressed mood. She expresses no suicidal ideation. She expresses no suicidal plans.    ED Course  Procedures (including critical care time) Labs Review Labs Reviewed  COMPREHENSIVE METABOLIC PANEL - Abnormal; Notable for the following:    Potassium 3.5 (*)    Creatinine, Ser 0.49 (*)    ALT 43 (*)    Total Bilirubin <0.2 (*)    All other components within normal limits  ETHANOL - Abnormal; Notable for the following:    Alcohol, Ethyl (B) 185 (*)    All other components within normal limits  SALICYLATE LEVEL - Abnormal; Notable for the following:    Salicylate Lvl <7.6  (*)    All other components within normal limits  CBC WITH DIFFERENTIAL  ACETAMINOPHEN LEVEL  TRICYCLICS SCREEN, URINE  URINE RAPID DRUG SCREEN (HOSP PERFORMED)    Imaging Review No results found.   EKG Interpretation None      Date: 03/27/2014  Rate: 77  Rhythm: normal sinus rhythm  QRS Axis: L axis  Intervals: normal  ST/T Wave abnormalities: normal  Conduction Disutrbances:none  Narrative Interpretation: borderline  Old EKG Reviewed: none available  Patient has been evaluated by TTS and meets criteria for admission.  Unfortunately, her no data available at this time.  That placement is being actively sought out  MDM   Final diagnoses:  Alcohol abuse         Garald Balding, NP 03/28/14 [phone]

## 2014-03-27 NOTE — ED Notes (Signed)
Called Poison control regarding pt. [name] at [employer] states to monitor pt for atleast 6 hrs post ingestion, longer in BP stays low. States to watch pt on a cardiac monitor, complete EKG. Does not recommend charcoal.

## 2014-03-28 ENCOUNTER — Encounter (HOSPITAL_COMMUNITY): Payer: Self-pay | Admitting: General Practice

## 2014-03-28 ENCOUNTER — Inpatient Hospital Stay (HOSPITAL_COMMUNITY)
Admission: AD | Admit: 2014-03-28 | Discharge: 2014-04-01 | DRG: 881 | Disposition: A | Discharge status: Home / Self Care | Payer: BC Managed Care – PPO | Source: Intra-hospital | Attending: Psychiatry | Admitting: Psychiatry

## 2014-03-28 DIAGNOSIS — E039 Hypothyroidism, unspecified: Secondary | ICD-10-CM | POA: Diagnosis present

## 2014-03-28 DIAGNOSIS — F329 Major depressive disorder, single episode, unspecified: Secondary | ICD-10-CM | POA: Diagnosis present

## 2014-03-28 DIAGNOSIS — Z818 Family history of other mental and behavioral disorders: Secondary | ICD-10-CM | POA: Diagnosis not present

## 2014-03-28 DIAGNOSIS — F1020 Alcohol dependence, uncomplicated: Secondary | ICD-10-CM

## 2014-03-28 DIAGNOSIS — F32A Depression, unspecified: Secondary | ICD-10-CM

## 2014-03-28 DIAGNOSIS — I10 Essential (primary) hypertension: Secondary | ICD-10-CM | POA: Diagnosis present

## 2014-03-28 DIAGNOSIS — F1024 Alcohol dependence with alcohol-induced mood disorder: Secondary | ICD-10-CM

## 2014-03-28 DIAGNOSIS — F10239 Alcohol dependence with withdrawal, unspecified: Secondary | ICD-10-CM | POA: Diagnosis present

## 2014-03-28 DIAGNOSIS — F1010 Alcohol abuse, uncomplicated: Secondary | ICD-10-CM | POA: Diagnosis not present

## 2014-03-28 DIAGNOSIS — Z8781 Personal history of (healed) traumatic fracture: Secondary | ICD-10-CM | POA: Diagnosis not present

## 2014-03-28 DIAGNOSIS — F17210 Nicotine dependence, cigarettes, uncomplicated: Secondary | ICD-10-CM | POA: Diagnosis present

## 2014-03-28 DIAGNOSIS — F1994 Other psychoactive substance use, unspecified with psychoactive substance-induced mood disorder: Secondary | ICD-10-CM

## 2014-03-28 LAB — RAPID URINE DRUG SCREEN, HOSP PERFORMED
Amphetamines: NOT DETECTED
Barbiturates: NOT DETECTED
Benzodiazepines: NOT DETECTED
Cocaine: NOT DETECTED
Opiates: NOT DETECTED
Tetrahydrocannabinol: NOT DETECTED

## 2014-03-28 LAB — COMPREHENSIVE METABOLIC PANEL
ALBUMIN: 3.9 g/dL (ref 3.5–5.2)
ALK PHOS: 93 U/L (ref 39–117)
ALT: 43 U/L — ABNORMAL HIGH (ref 0–35)
ANION GAP: 14 (ref 5–15)
AST: 29 U/L (ref 0–37)
BUN: 9 mg/dL (ref 6–23)
CO2: 24 mEq/L (ref 19–32)
CREATININE: 0.49 mg/dL — AB (ref 0.50–1.10)
Calcium: 8.4 mg/dL (ref 8.4–10.5)
Chloride: 102 mEq/L (ref 96–112)
GFR calc Af Amer: >90 mL/min (ref >90)
GFR calc non Af Amer: >90 mL/min (ref >90)
Glucose, Bld: 93 mg/dL (ref 70–99)
POTASSIUM: 3.5 meq/L — AB (ref 3.7–5.3)
Sodium: 140 mEq/L (ref 137–147)
Total Bilirubin: <0.2 mg/dL — ABNORMAL LOW (ref 0.3–1.2)
Total Protein: 7.4 g/dL (ref 6.0–8.3)

## 2014-03-28 LAB — ACETAMINOPHEN LEVEL

## 2014-03-28 LAB — TRICYCLICS SCREEN, URINE: TCA SCRN: NOT DETECTED

## 2014-03-28 LAB — SALICYLATE LEVEL

## 2014-03-28 LAB — ETHANOL: ALCOHOL ETHYL (B): 185 mg/dL — AB (ref 0–11)

## 2014-03-28 MED ORDER — CHLORDIAZEPOXIDE HCL 25 MG PO CAPS
25.0000 mg | ORAL_CAPSULE | Freq: Four times a day (QID) | ORAL | Status: AC
  Administered 2014-03-28 – 2014-03-29 (×6): 25 mg via ORAL
  Filled 2014-03-28 (×5): qty 1

## 2014-03-28 MED ORDER — ONDANSETRON 4 MG PO TBDP: 4.0000 mg | ORAL_TABLET | Freq: Four times a day (QID) | ORAL | Status: AC | PRN

## 2014-03-28 MED ORDER — LORAZEPAM 1 MG PO TABS: 0.0000 mg | ORAL_TABLET | Freq: Two times a day (BID) | ORAL | Status: DC

## 2014-03-28 MED ORDER — CHLORDIAZEPOXIDE HCL 25 MG PO CAPS: 25.0000 mg | ORAL_CAPSULE | Freq: Four times a day (QID) | ORAL | Status: AC | PRN

## 2014-03-28 MED ORDER — LORAZEPAM 1 MG PO TABS: 0.0000 mg | ORAL_TABLET | Freq: Four times a day (QID) | ORAL | Status: DC

## 2014-03-28 MED ORDER — CHLORDIAZEPOXIDE HCL 25 MG PO CAPS
25.0000 mg | ORAL_CAPSULE | Freq: Three times a day (TID) | ORAL | Status: AC
  Administered 2014-03-30 (×3): 25 mg via ORAL
  Filled 2014-03-28 (×4): qty 1

## 2014-03-28 MED ORDER — CHLORDIAZEPOXIDE HCL 25 MG PO CAPS
25.0000 mg | ORAL_CAPSULE | Freq: Every day | ORAL | Status: AC
  Administered 2014-04-01: 25 mg via ORAL
  Filled 2014-03-28: qty 1

## 2014-03-28 MED ORDER — NICOTINE 21 MG/24HR TD PT24
21.0000 mg | MEDICATED_PATCH | Freq: Every day | TRANSDERMAL | Status: DC
  Administered 2014-03-28 – 2014-04-01 (×4): 21 mg via TRANSDERMAL
  Filled 2014-03-28 (×6): qty 1

## 2014-03-28 MED ORDER — THIAMINE HCL 100 MG/ML IJ SOLN: 100.0000 mg | Freq: Every day | INTRAMUSCULAR | Status: DC

## 2014-03-28 MED ORDER — ALUM & MAG HYDROXIDE-SIMETH 200-200-20 MG/5ML PO SUSP: 30.0000 mL | ORAL | Status: DC | PRN

## 2014-03-28 MED ORDER — MAGNESIUM HYDROXIDE 400 MG/5ML PO SUSP: 30.0000 mL | Freq: Every day | ORAL | Status: DC | PRN

## 2014-03-28 MED ORDER — HYDROXYZINE HCL 25 MG PO TABS
25.0000 mg | ORAL_TABLET | Freq: Four times a day (QID) | ORAL | Status: AC | PRN
  Administered 2014-03-28 – 2014-03-30 (×3): 25 mg via ORAL
  Filled 2014-03-28 (×4): qty 1

## 2014-03-28 MED ORDER — LOPERAMIDE HCL 2 MG PO CAPS
2.0000 mg | ORAL_CAPSULE | ORAL | Status: AC | PRN
Start: 2014-03-28 — End: 2014-03-31

## 2014-03-28 MED ORDER — ACETAMINOPHEN 325 MG PO TABS: 650.0000 mg | ORAL_TABLET | Freq: Four times a day (QID) | ORAL | Status: DC | PRN

## 2014-03-28 MED ORDER — CHLORDIAZEPOXIDE HCL 25 MG PO CAPS
25.0000 mg | ORAL_CAPSULE | ORAL | Status: AC
  Administered 2014-03-31 (×2): 25 mg via ORAL
  Filled 2014-03-28 (×2): qty 1

## 2014-03-28 MED ORDER — VITAMIN B-1 100 MG PO TABS
100.0000 mg | ORAL_TABLET | Freq: Every day | ORAL | Status: DC
  Administered 2014-03-29 – 2014-04-01 (×4): 100 mg via ORAL
  Filled 2014-03-28 (×6): qty 1

## 2014-03-28 MED ORDER — ADULT MULTIVITAMIN W/MINERALS CH
1.0000 | ORAL_TABLET | Freq: Every day | ORAL | Status: DC
  Administered 2014-03-29 – 2014-04-01 (×4): 1 via ORAL
  Filled 2014-03-28 (×6): qty 1

## 2014-03-28 MED ORDER — SODIUM CHLORIDE 0.9 % IV BOLUS (SEPSIS)
1000.0000 mL | Freq: Once | INTRAVENOUS | Status: AC
  Administered 2014-03-28: 1000 mL via INTRAVENOUS

## 2014-03-28 MED ORDER — VITAMIN B-1 100 MG PO TABS
100.0000 mg | ORAL_TABLET | Freq: Every day | ORAL | Status: DC
  Administered 2014-03-28: 100 mg via ORAL
  Filled 2014-03-28: qty 1

## 2014-03-28 NOTE — ED Notes (Signed)
Spoke with Patty at poison control, gave updated. No new orders.

## 2014-03-28 NOTE — ED Notes (Signed)
Spoke with Betsy Pries, sts arrival time 1615

## 2014-03-28 NOTE — ED Notes (Signed)
Pt asking for an update on plan of care, states she wants to go home. Will have MD consult with patient.

## 2014-03-28 NOTE — Progress Notes (Signed)
Adult Psychoeducational Group Note  Date:  03/28/2014 Time: 8:15  Group Topic/Focus:  Wrap-Up Group:   The focus of this group is to help patients review their daily goal of treatment and discuss progress on daily workbooks.  Participation Level:  Active  Participation Quality:  Appropriate and Attentive  Affect:  Appropriate  Cognitive:  Alert and Appropriate  Insight: Appropriate  Engagement in Group:  Engaged  Modes of Intervention:  Discussion  Additional Comments:  Pt was attentive and appropriate during today's group discussion. Pt stated that today was an okay day. Defendant stated that she is trying to beat this disease and try and get an sponsor for alchol.   Theodoro Grist D 03/28/2014, 9:29 PM

## 2014-03-28 NOTE — BH Assessment (Signed)
Tele Assessment Note   [name] is an 58 y.o. female presenting to ED due to overdose of Celexa and etoh consumption. Pt reports she has an on-going problem with etoh and just lost her father to cancer. Pt reports she buried her father on Friday, and tonight she was drinking heavily, 14 beers, and took 12-20 Celexa "to ease the pain." Pt denies this was a suicidal act. She denies past suicidal gestures, she reports she has had thoughts in the past like "maybe I would be better off dead," but denies planning or intent. Pt denies HI, self-harm, a/v hallucinations. Pt is drowsy but oriented times 4. Mood is depressed with affect congruent to mood. Judgement is impaired, speech is logical and coherent.  Pt denies hx of depression but notes she takes Celexa "to take the edge off," "and to help with stress." Pt has been dx with Major Depressive Disorder in 2014 when inpt at [facility] for alcohol used disorder. Pt does not currently see a counselor or psychiatrist, but goes to PCP for medication.   Pt denies anxiety, phobias, or history of PTSD. Pt denies hx of abuse.   Pt began drinking at age 44 and has been drinking daily 8-9 beers for months. She reports she went to rehab last summer and was able to maintain sobriety for 3 weeks. Pt denies hx of seizures with withdrawal and reports feeling good when she does not drink. She would like to get assistance to stop drinking. "I just can't seem to stop."   Axis I: 303.90 Alcohol Use Disorder, Severe  311 Depressive Disorder Unspecified Axis II: Deferred Axis III:  Past Medical History  Diagnosis Date  . Hypertension   . Depression   . Suicidal overdose   . Alcoholism    Axis IV: problems with access to health care services and problems with primary support group Axis V: 21-30 behavior considerably influenced by delusions or hallucinations OR serious impairment in judgment, communication OR inability to function in almost all areas  Past Medical  History:  Past Medical History  Diagnosis Date  . Hypertension   . Depression   . Suicidal overdose   . Alcoholism     Past Surgical History  Procedure Laterality Date  . Fracture surgery      fractured left ankle 2007  . Ganglion cyst excision      left foot    Family History: No family history on file.  Social History:  reports that she has been smoking Cigarettes.  She has a 7.5 pack-year smoking history. She has never used smokeless tobacco. She reports that she drinks about 33 ounces of alcohol per week. She reports that she does not use illicit drugs.  Additional Social History:  Alcohol / Drug Use Pain Medications: SEE MAR, reports none Prescriptions: Pt reports she takes Celexa prescribed by PCP, [employer]. 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) Withdrawal Symptoms:  (reports none at this time, denies hx of seizures, reports she feels good when she is not drinking) Substance #1 Name of Substance 1: etoh 1 - Age of First Use: 17 1 - Amount (size/oz): 8-9 beers, reports she drank 14 beers tonight 1 - Frequency: daily  1 - Duration: reports she has been drinking for months at this level 1 - Last Use / Amount: tonight 14 beers  CIWA: CIWA-Ar BP: 89/59 mmHg Pulse Rate: 71 Nausea and Vomiting: no nausea and no vomiting Tactile Disturbances: none Auditory Disturbances: not present Visual Disturbances: not present Anxiety: mildly anxious Agitation: normal activity COWS:    PATIENT STRENGTHS: (choose at least two) Ability for insight Motivation for treatment/growth  Allergies: No Known Allergies  Home Medications:  (Not in a hospital admission)  OB/GYN Status:  No LMP  recorded. Patient is postmenopausal.  General Assessment Data Location of Assessment: [facility] ED Is this a Tele or Face-to-Face Assessment?: Tele Assessment Is this an Initial Assessment or a Re-assessment for this encounter?: Initial Assessment Living Arrangements: Alone Can pt return to current living arrangement?: Yes Admission Status: Voluntary Is patient capable of signing voluntary admission?: Yes Transfer from: Home (via EMS) Referral Source: Self/Family/Friend     [place] Living Arrangements: Alone Name of Psychiatrist: none, PCP [facility] prescribes her Celexa Name of Therapist: none  Education Status Is patient currently in school?: No Current Grade: n/a Highest grade of school patient has completed: 10 Name of school: n/a Contact person: n/a  Risk to self with the past 6 months Suicidal Ideation: No-Not Currently/Within Last 6 Months (Pt overdosed on Celexa and etoh denies Suicide attempt) Suicidal Intent: No-Not Currently/Within Last 6 Months (pt denies suicide attempt but significant overdose) Is patient at risk for suicide?: Yes Suicidal Plan?: No (took overdose tonight) Access to Means: Yes Specify Access to Suicidal Means: medication overdose What has been your use of drugs/alcohol within the last 12 months?: Pt has been using etoh since age 37. She reports having a problem with etoh use for years. She went through rehab last summer and had 3 weeks sober. She had 24 hr OBS detox this summer. She has been drinking 8-9 beers daily for months and drank 14 beers tonight, along with overdose on Celexa Previous Attempts/Gestures: No How many times?: 0 Other Self Harm Risks: none Triggers for Past Attempts: None known Intentional Self Injurious Behavior: None Family Suicide History: No Recent stressful life event(s): Loss (Comment) (burried father on 11-02-22, died of cancer) Persecutory voices/beliefs?: No Depression: Yes (pt denies, "I just have a  drinking problem") Depression Symptoms: Despondent Substance abuse history and/or treatment for substance abuse?: Yes Suicide prevention information given to non-admitted patients:  (being admitted)  Risk to Others within the past 6 months Homicidal Ideation: No Thoughts of Harm to Others: No Current Homicidal Intent: No Current Homicidal Plan: No Access to Homicidal Means: No Identified Victim: none History of harm to others?: No Assessment of Violence: None Noted Violent Behavior Description: none Does patient have access to weapons?: No Criminal Charges Pending?: No Does patient have a court date: No  Psychosis Hallucinations: None noted Delusions: None noted  Mental Status Report Appear/Hygiene: Unremarkable;In scrubs Eye Contact: Fair Motor Activity: Unremarkable Speech: Logical/coherent Level of Consciousness: Drowsy Mood: Depressed Affect: Appropriate to circumstance Anxiety Level: None Thought Processes: Coherent;Relevant Judgement: Impaired Orientation: Person;Place;Time;Situation Obsessive Compulsive Thoughts/Behaviors: None  Cognitive Functioning Concentration: Normal Memory: Recent Intact;Remote Intact IQ: Average Insight: Good Impulse Control: Poor Appetite: Poor Weight Loss: 0 Weight Gain: 0 Sleep: No Change Total Hours of Sleep: 7 (weekends 10) Vegetative Symptoms: None  ADLScreening [facility] Assessment Services) Patient's cognitive ability adequate to safely complete daily activities?: Yes Patient able to express need for assistance with ADLs?: Yes Independently performs ADLs?: Yes (appropriate for developmental  age)  Prior Inpatient Therapy Prior Inpatient Therapy: Yes Prior Therapy Dates: 2014, 7/15 Prior Therapy Facilty/Provider(s): [facility], [facility] OBS Reason for Treatment: etoh abuse  Prior Outpatient Therapy Prior Outpatient Therapy: No Prior Therapy Dates: na Prior Therapy Facilty/Provider(s): na Reason for Treatment: na  ADL Screening  (condition at time of admission) Patient's cognitive ability adequate to safely complete daily activities?: Yes Is the patient deaf or have difficulty hearing?: No Does the patient have difficulty seeing, even when wearing glasses/contacts?: No Does the patient have difficulty concentrating, remembering, or making decisions?: No Patient able to express need for assistance with ADLs?: Yes Does the patient have difficulty dressing or bathing?: No Independently performs ADLs?: Yes (appropriate for developmental age) Does the patient have difficulty walking or climbing stairs?: No Weakness of Legs: None Weakness of Arms/Hands: None  Home Assistive Devices/Equipment Home Assistive Devices/Equipment: None    Abuse/Neglect Assessment (Assessment to be complete while patient is alone) Physical Abuse: Denies Verbal Abuse: Denies Sexual Abuse: Denies Exploitation of patient/patient's resources: Denies Self-Neglect: Yes, present (Comment) (reports has not been eating much due to her drinking) Values / Beliefs Cultural Requests During Hospitalization: None Spiritual Requests During Hospitalization: None   Advance Directives (For Healthcare) Does patient have an advance directive?: No Would patient like information on creating an advanced directive?: No - patient declined information Nutrition Screen- [facility] Adult/[facility]/AP Patient's home diet: Regular  Additional Information 1:1 In Past 12 Months?: No CIRT Risk: No Elopement Risk: No Does patient have medical clearance?: No     Disposition:  Pt meets inpt criteria. [facility] is at capacity. TTS to seek placement once pt is medically cleared.   Lear Ng, [facility] Triage Specialist 03/28/2014 1:00 AM

## 2014-03-28 NOTE — ED Notes (Signed)
Pt eating breakfast 

## 2014-03-28 NOTE — Tx Team (Signed)
Initial Interdisciplinary Treatment Plan   PATIENT STRESSORS: Substance abuse   PROBLEM LIST: Problem List/Patient Goals Date to be addressed Date deferred Reason deferred Estimated date of resolution  Substance Abuse 03/28/2014           Depression 03/28/2014           Suicide Attempt 03/28/2014                              DISCHARGE CRITERIA:  Improved stabilization in mood, thinking, and/or behavior Verbal commitment to aftercare and medication compliance Withdrawal symptoms are absent or subacute and managed without 24-hour nursing intervention  PRELIMINARY DISCHARGE PLAN: Attend PHP/IOP Attend 12-step recovery group  PATIENT/FAMIILY INVOLVEMENT: This treatment plan has been presented to and reviewed with the patient, [name].  The patient and family have been given the opportunity to ask questions and make suggestions.  Gaylan Gerold E 03/28/2014, 6:49 PM

## 2014-03-28 NOTE — ED Notes (Signed)
Attempted to call [facility] for transport

## 2014-03-28 NOTE — Progress Notes (Signed)
Patient ID: [name], female   DOB: Feb 15, 1956, 58 y.o.   MRN: [id number]  Fusae is a female admitted to [facility] for ETOH detox and depression. Patient was picked up by EMS after she states drinking 14 beers and taking, "about 10 celexa's." Patient reports that her father has recently passed away and she has been drinking heavily since then. Patient reports that their relationship was estranged. Patient lives alone but across the street from her son. Patient has a past medical history of depression and hypertension. Patient currently denies SI/HI and A/V hallucinations. Patient reports very mild withdrawal symptoms. Patient denies any pain at this time. Patient is oriented to the unit and verbalizes understanding of the admission process. Q15 minute safety checks initiated and are maintained at this time. Patient has no questions or concerns at this time. Patient was given dinner back on the unit.

## 2014-03-28 NOTE — ED Notes (Signed)
telepsych in process. 

## 2014-03-28 NOTE — ED Notes (Signed)
Dr. Mingo Amber in to consult with the patient.

## 2014-03-28 NOTE — ED Notes (Signed)
Pt updated that Psychiatrist will come by to see her to see if it is appropriate for her to go home without inpatient treatment.

## 2014-03-28 NOTE — BH Assessment (Signed)
Per RN note pt is to be monitored for 6 hours longer if BP remains low and an EKG should be done.   Per Jenna Creamer, NP pt took 12-20 celexa and alcohol. She has to be monitored due to the OD on celexa. Pt is drowsy but able to be assessed.   Requested cart be placed in pt room for assessment. Cart will call [facility] desktop #2 when ready for assessment. [phone].   Assessment to begin shortly.   Lear Ng, [facility] Triage Specialist 03/28/2014 12:07 AM

## 2014-03-28 NOTE — ED Notes (Signed)
Dr. Kathie Dike at bedside.

## 2014-03-28 NOTE — Consult Note (Signed)
[name] Psychiatry Consult   Reason for Consult:  Alcohol intoxication and unintentional overdose. Referring Physician:  Dr. Ellender Hose is an 58 y.o. female. Total Time spent with patient: 45 minutes  Assessment: AXIS I:  Substance Induced Mood Disorder and Alcohol dependence and intoxication and Grief AXIS II:  Deferred AXIS III:   Past Medical History  Diagnosis Date  . Hypertension   . Depression   . Suicidal overdose   . Alcoholism    AXIS IV:  other psychosocial or environmental problems, problems related to social environment and problems with primary support group AXIS V:  41-50 serious symptoms  Plan:  Case discussed with Dr. Mingo Amber and staff RN. Recommend alcohol detox treatment with ativan  Supportive therapy provided about ongoing stressors. Discussed crisis plan, support from social network, calling 911, coming to the Emergency Department, and calling Suicide Hotline. Recoomended psych observation unit and alcohol detox treatment  Subjective:   [name] is a 58 y.o. female patient admitted with Alcohol intoxication and unintentional overdose.  HPI:  [name] is an 58 y.o. female seen face to face for psych evaluation in [facility] for increased symptoms of depression, alcohol intoxication and unintentional overdose of Celexa. Patient stated that she has been in emotional crisis situation due to loss of family recently. She reports she has an on-going problem with etoh and just lost her father to cancer. Pt reports she buried her father on Friday. She was drinking heavily, 14 beers, and took 12-20 Celexa "to ease the pain." She  denies this was a suicidal act. She denies past suicidal gestures, She denies current suicidal or homicidal ideation, intention or plans. 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 She has been supported her mother and children who lives close by. She  denies HI, self-harm, a/v hallucinations. She is calm and cooperative. She has been taking Celexa from PCP at [place] [facility] office. She has been dx with Major Depressive Disorder in 2014 when inpt at [facility] for alcohol used disorder. She denies anxiety, phobias, or history of PTSD. She began drinking at age 14 and has been drinking daily 8-9 beers for months. She reports she went to rehab last summer and was able to maintain sobriety for 3 weeks. Pt denies hx of seizures with withdrawal and reports feeling good when she does not drink. She would like to get assistance to stop drinking. "I just can't seem to stop."  She agree to stay for alcohol detox treatment.    HPI Elements:   Location:  depression, alcohol intoxication and grief. Quality:  unable to deal with recent crisis. Severity:  unintentional overdose of medication while intoxicated. Timing:  death in the family few days ago.  Past Psychiatric History: Past Medical History  Diagnosis Date  . Hypertension   . Depression   . Suicidal overdose   . Alcoholism     reports that she has been smoking Cigarettes.  She has a 7.5 pack-year smoking history. She has never used smokeless tobacco. She reports that she drinks about 33 ounces of alcohol per week. She reports that she does not use illicit drugs. No family history on file. Family History Substance Abuse: No Family Supports: Yes, List: (sister) Living Arrangements: Alone Can pt return to current living arrangement?: Yes Abuse/Neglect [facility]) Physical Abuse: Denies Verbal Abuse: Denies Sexual Abuse: Denies Allergies:  No Known Allergies  ACT Assessment  Complete:  Yes:    Educational Status    Risk to Self: Risk to self with the past 6 months Suicidal Ideation: No-Not Currently/Within Last 6 Months (Pt overdosed on Celexa and etoh denies Suicide attempt) Suicidal Intent: No-Not Currently/Within Last 6 Months (pt denies suicide attempt but significant overdose) Is patient  at risk for suicide?: Yes Suicidal Plan?: No (took overdose tonight) Access to Means: Yes Specify Access to Suicidal Means: medication overdose What has been your use of drugs/alcohol within the last 12 months?: Pt has been using etoh since age 5. She reports having a problem with etoh use for years. She went through rehab last summer and had 3 weeks sober. She had 24 hr OBS detox this summer. She has been drinking 8-9 beers daily for months and drank 14 beers tonight, along with overdose on Celexa Previous Attempts/Gestures: No How many times?: 0 Other Self Harm Risks: none Triggers for Past Attempts: None known Intentional Self Injurious Behavior: None Family Suicide History: No Recent stressful life event(s): Loss (Comment) (burried father on 2022/08/06, died of cancer) Persecutory voices/beliefs?: No Depression: Yes (pt denies, "I just have a drinking problem") Depression Symptoms: Despondent Substance abuse history and/or treatment for substance abuse?: Yes Suicide prevention information given to non-admitted patients:  (being admitted)  Risk to Others: Risk to Others within the past 6 months Homicidal Ideation: No Thoughts of Harm to Others: No Current Homicidal Intent: No Current Homicidal Plan: No Access to Homicidal Means: No Identified Victim: none History of harm to others?: No Assessment of Violence: None Noted Violent Behavior Description: none Does patient have access to weapons?: No Criminal Charges Pending?: No Does patient have a court date: No  Abuse: Abuse/Neglect Assessment (Assessment to be complete while patient is alone) Physical Abuse: Denies Verbal Abuse: Denies Sexual Abuse: Denies Exploitation of patient/patient's resources: Denies Self-Neglect: Yes, present (Comment) (reports has not been eating much due to her drinking)  Prior Inpatient Therapy: Prior Inpatient Therapy Prior Inpatient Therapy: Yes Prior Therapy Dates: 2014, 7/15 Prior Therapy  Facilty[facility]Provider(s): [facility] Reason for Treatment: etoh abuse  Prior Outpatient Therapy: Prior Outpatient Therapy Prior Outpatient Therapy: No Prior Therapy Dates: na Prior Therapy Facilty/Provider(s): na Reason for Treatment: na  Additional Information: Additional Information 1:1 In Past 12 Months?: No CIRT Risk: No Elopement Risk: No Does patient have medical clearance?: No    Objective: Blood pressure 111/71, pulse 76, temperature 98.4 F (36.9 C), temperature source Oral, resp. rate 16, height 5' 1" (1.549 m), weight 68.04 kg (150 lb), SpO2 95.00%.Body mass index is 28.36 kg/(m^2). Results for orders placed during the hospital encounter of 03/27/14 (from the past 72 hour(s))  CBC WITH DIFFERENTIAL     Status: None   Collection Time    03/27/14 11:38 PM      Result Value Ref Range   WBC 4.9  4.0 - 10.5 K/uL   RBC 4.57  3.87 - 5.11 MIL/uL   Hemoglobin 14.0  12.0 - 15.0 g/dL   HCT 39.5  36.0 - 46.0 %   MCV 86.4  78.0 - 100.0 fL   MCH 30.6  26.0 - 34.0 pg   MCHC 35.4  30.0 - 36.0 g/dL   RDW 12.4  11.5 - 15.5 %   Platelets 321  150 - 400 K/uL   Neutrophils Relative % 44  43 - 77 %   Neutro Abs 2.2  1.7 - 7.7 K/uL   Lymphocytes Relative 45  12 - 46 %   Lymphs  Abs 2.2  0.7 - 4.0 K/uL   Monocytes Relative 8  3 - 12 %   Monocytes Absolute 0.4  0.1 - 1.0 K/uL   Eosinophils Relative 2  0 - 5 %   Eosinophils Absolute 0.1  0.0 - 0.7 K/uL   Basophils Relative 1  0 - 1 %   Basophils Absolute 0.1  0.0 - 0.1 K/uL  COMPREHENSIVE METABOLIC PANEL     Status: Abnormal   Collection Time    03/27/14 11:38 PM      Result Value Ref Range   Sodium 140  137 - 147 mEq/L   Potassium 3.5 (*) 3.7 - 5.3 mEq/L   Chloride 102  96 - 112 mEq/L   CO2 24  19 - 32 mEq/L   Glucose, Bld 93  70 - 99 mg/dL   BUN 9  6 - 23 mg/dL   Creatinine, Ser 0.49 (*) 0.50 - 1.10 mg/dL   Calcium 8.4  8.4 - 10.5 mg/dL   Total Protein 7.4  6.0 - 8.3 g/dL   Albumin 3.9  3.5 - 5.2 g/dL   AST 29  0 - 37 U/L   ALT  43 (*) 0 - 35 U/L   Alkaline Phosphatase 93  39 - 117 U/L   Total Bilirubin <0.2 (*) 0.3 - 1.2 mg/dL   GFR calc non Af Amer >90  >90 mL/min   GFR calc Af Amer >90  >90 mL/min   Comment: (NOTE)     The eGFR has been calculated using the CKD EPI equation.     This calculation has not been validated in all clinical situations.     eGFR's persistently <90 mL/min signify possible Chronic Kidney     Disease.   Anion gap 14  5 - 15  ETHANOL     Status: Abnormal   Collection Time    03/27/14 11:38 PM      Result Value Ref Range   Alcohol, Ethyl (B) 185 (*) 0 - 11 mg/dL   Comment:            LOWEST DETECTABLE LIMIT FOR     SERUM ALCOHOL IS 11 mg/dL     FOR MEDICAL PURPOSES ONLY  ACETAMINOPHEN LEVEL     Status: None   Collection Time    03/27/14 11:38 PM      Result Value Ref Range   Acetaminophen (Tylenol), Serum <15.0  10 - 30 ug/mL   Comment:            THERAPEUTIC CONCENTRATIONS VARY     SIGNIFICANTLY. A RANGE OF 10-30     ug/mL MAY BE AN EFFECTIVE     CONCENTRATION FOR MANY PATIENTS.     HOWEVER, SOME ARE BEST TREATED     AT CONCENTRATIONS OUTSIDE THIS     RANGE.     ACETAMINOPHEN CONCENTRATIONS     >150 ug/mL AT 4 HOURS AFTER     INGESTION AND >50 ug/mL AT 12     HOURS AFTER INGESTION ARE     OFTEN ASSOCIATED WITH TOXIC     REACTIONS.  SALICYLATE LEVEL     Status: Abnormal   Collection Time    03/27/14 11:38 PM      Result Value Ref Range   Salicylate Lvl <4.5 (*) 2.8 - 62.5 mg/dL  TRICYCLICS SCREEN, URINE     Status: None   Collection Time    03/27/14 11:53 PM      Result Value Ref Range  TCA Scrn NONE DETECTED  NONE DETECTED   Comment:            LOWEST DETECTABLE LIMITS     FOR URINE DRUG SCREEN     Drug Class       Cutoff (ng/mL)     Tricyclics       902  URINE RAPID DRUG SCREEN (HOSP PERFORMED)     Status: None   Collection Time    03/27/14 11:53 PM      Result Value Ref Range   Opiates NONE DETECTED  NONE DETECTED   Cocaine NONE DETECTED  NONE DETECTED    Benzodiazepines NONE DETECTED  NONE DETECTED   Amphetamines NONE DETECTED  NONE DETECTED   Tetrahydrocannabinol NONE DETECTED  NONE DETECTED   Barbiturates NONE DETECTED  NONE DETECTED   Comment:            DRUG SCREEN FOR MEDICAL PURPOSES     ONLY.  IF CONFIRMATION IS NEEDED     FOR ANY PURPOSE, NOTIFY LAB     WITHIN 5 DAYS.                LOWEST DETECTABLE LIMITS     FOR URINE DRUG SCREEN     Drug Class       Cutoff (ng/mL)     Amphetamine      1000     Barbiturate      200     Benzodiazepine   409     Tricyclics       735     Opiates          300     Cocaine          300     THC              50   Labs are reviewed and are pertinent for alcohol intoxication.  No current facility-administered medications for this encounter.   Current Outpatient Prescriptions  Medication Sig Dispense Refill  . amLODipine (NORVASC) 10 MG tablet Take 1 tablet (10 mg total) by mouth daily. For high blood pressure      . citalopram (CELEXA) 20 MG tablet Take 1 tablet (20 mg total) by mouth daily.        Psychiatric Specialty Exam: Physical Exam Full physical performed in Emergency Department. I have reviewed this assessment and concur with its findings.   Review of Systems  Constitutional: Positive for malaise/fatigue and diaphoresis.  Musculoskeletal: Positive for myalgias.  Neurological: Positive for dizziness, tremors and weakness.  Psychiatric/Behavioral: Positive for depression and substance abuse. The patient is nervous/anxious and has insomnia.     Blood pressure 111/71, pulse 76, temperature 98.4 F (36.9 C), temperature source Oral, resp. rate 16, height 5' 1" (1.549 m), weight 68.04 kg (150 lb), SpO2 95.00%.Body mass index is 28.36 kg/(m^2).  General Appearance: Guarded  Eye Contact::  Good  Speech:  Clear and Coherent and Slow  Volume:  Decreased  Mood:  Anxious and Depressed  Affect:  Constricted and Depressed  Thought Process:  Coherent and Intact  Orientation:  Full (Time,  Place, and Person)  Thought Content:  WDL  Suicidal Thoughts:  No  Homicidal Thoughts:  No  Memory:  Immediate;   Good Recent;   Good  Judgement:  Intact  Insight:  Fair  Psychomotor Activity:  Decreased  Concentration:  Good  Recall:  Good  Fund of Knowledge:Good  Language: Good  Akathisia:  NA  Handed:  Right  AIMS (if indicated):     Assets:  Communication Skills Desire for Improvement Financial Resources/Insurance Housing Intimacy Leisure Time [place] Talents/Skills Transportation Vocational/Educational  Sleep:      Musculoskeletal: Strength & Muscle Tone: within normal limits Gait & Station: normal Patient leans: N/A  Treatment Plan Summary: Daily contact with patient to assess and evaluate symptoms and progress in treatment Medication management Alcohol detox and observation unit until bed is available in substance abuse unit.   ,JANARDHAHA R. 03/28/2014 12:59 PM

## 2014-03-28 NOTE — ED Notes (Signed)
Update given to poison control

## 2014-03-29 DIAGNOSIS — F10239 Alcohol dependence with withdrawal, unspecified: Secondary | ICD-10-CM

## 2014-03-29 DIAGNOSIS — F10939 Alcohol use, unspecified with withdrawal, unspecified: Secondary | ICD-10-CM

## 2014-03-29 DIAGNOSIS — F3289 Other specified depressive episodes: Secondary | ICD-10-CM

## 2014-03-29 DIAGNOSIS — F1020 Alcohol dependence, uncomplicated: Secondary | ICD-10-CM

## 2014-03-29 DIAGNOSIS — F329 Major depressive disorder, single episode, unspecified: Secondary | ICD-10-CM

## 2014-03-29 MED ORDER — ACAMPROSATE CALCIUM 333 MG PO TBEC
666.0000 mg | DELAYED_RELEASE_TABLET | Freq: Three times a day (TID) | ORAL | Status: DC
  Administered 2014-03-29 – 2014-04-01 (×9): 666 mg via ORAL
  Filled 2014-03-29: qty 2
  Filled 2014-03-29: qty 24
  Filled 2014-03-29 (×5): qty 2
  Filled 2014-03-29: qty 24
  Filled 2014-03-29: qty 2
  Filled 2014-03-29: qty 24
  Filled 2014-03-29 (×2): qty 2
  Filled 2014-03-29 (×2): qty 24
  Filled 2014-03-29 (×2): qty 2
  Filled 2014-03-29: qty 24
  Filled 2014-03-29: qty 2

## 2014-03-29 NOTE — Progress Notes (Signed)
Recreation Therapy Notes  Animal-Assisted Activity/Therapy (AAA/T) Program Checklist/Progress Notes Patient Eligibility Criteria Checklist & Daily Group note for Rec Tx Intervention  Date: 09.29.2015 Time: 2:45pm Location: [address]   AAA/T Program Assumption of Risk Form signed by Patient/ or Parent Legal Guardian yes  Patient is free of allergies or sever asthma yes  Patient reports no fear of animals yes  Patient reports no history of cruelty to animals yes   Patient understands his/her participation is voluntary yes  Patient washes hands before animal contact yes  Patient washes hands after animal contact yes  Behavioral Response: Appropriate   Education: Hand Washing, Appropriate Animal Interaction   Education Outcome: Acknowledges education.   Clinical Observations/Feedback: Patient interacted appropriately with therapy dog and peers during session. Patient asked appropriate questions about therapy dog and shared stories about pets she has at home with group.  [name], LRT/CTRS  Azalea Cedar L 03/29/2014 4:12 PM

## 2014-03-29 NOTE — [facility] Group Notes (Signed)
Adult Psychoeducational Group Note  Date:  03/29/2014 Time:  10:28 PM  Group Topic/Focus:  AA Meeting  Participation Level:  Minimal  Participation Quality:  Attentive  Affect:  Flat  Cognitive:  Appropriate  Insight: Good  Engagement in Group:  Limited  Modes of Intervention:  Discussion and Education  Additional Comments:  [name] was attentive during group.  She plans [facility] and see a psychiatrist.   Vikki Ports 03/29/2014, 10:28 PM
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
## 2014-03-29 NOTE — Tx Team (Signed)
Interdisciplinary Treatment Plan Update (Adult)   Date: 03/29/2014   Time Reviewed: 1:00 PM  Progress in Treatment:  Attending groups: No-new pt  Participating in groups: no new pt.   Taking medication as prescribed: Yes  Tolerating medication: Yes  Family/Significant othe contact made: Not yet.   Patient understands diagnosis: Yes, AEB seeking treatment for ETOH detox, depression, and med stabilization.  Discussing patient identified problems/goals with staff: Yes  Medical problems stabilized or resolved: Yes  Denies suicidal/homicidal ideation: Yes  Patient has not harmed self or Others: Yes  New problem(s) identified:  Discharge Plan or Barriers: CSW assessing for appropriate referrals at this time.  Additional comments:  [name] is a female admitted to [facility] for ETOH detox and depression. Patient was picked up by EMS after she states drinking 14 beers and taking, "about 10 celexa's." Patient reports that her father has recently passed away and she has been drinking heavily since then. Patient reports that their relationship was estranged. Patient lives alone but across the street from her son. Patient has a past medical history of depression and hypertension. Patient currently denies SI/HI and A/V hallucinations. Patient reports very mild withdrawal symptoms. Patient denies any pain at this time. Patient is oriented to the unit and verbalizes understanding of the admission process.     Reason for Continuation of Hospitalization: Mood stabilization ETOH detox Med management  Estimated length of stay: 3-5 days  For review of initial/current patient goals, please see plan of care.  Attendees:  Patient:    Family:    Physician: Carlton Adam MD 03/29/2014   Nursing: Sharl Ma RN 03/29/2014   Clinical Social Worker Capron, Parker  03/29/2014   Other: Coolidge Breeze RN 03/29/2014   Other: Gerline Legacy Nurse CM 03/29/2014   Other: Burnis Medin LCSW 03/29/2014   Other:    Scribe for Treatment Team:   Nira Conn Smart [place] 03/29/2014   Pt and CSW reviewed pt's identified goals and treatment plan. Pt verbalized understanding and agreed to treatment plan.

## 2014-03-29 NOTE — ED Provider Notes (Signed)
Medical screening examination/treatment/procedure(s) were performed by non-physician practitioner and as supervising physician I was immediately available for consultation/collaboration.   EKG Interpretation None       Kalman Drape, MD 03/29/14 [phone]
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
## 2014-03-29 NOTE — [facility] Suicide Risk Assessment (Signed)
   Nursing information obtained from:  Patient Demographic factors:  Caucasian;Living alone Current Mental Status:  NA Loss Factors:  Loss of significant relationship (father passed away) Historical Factors:  Prior suicide attempts;Family history of mental illness or substance abuse Risk Reduction Factors:  Sense of responsibility to family;Employed Total Time spent with patient: 45 minutes  CLINICAL FACTORS:   Alcohol Dependence, Depression Psychiatric Specialty Exam: Physical Exam  ROS  Blood pressure 117/87, pulse 102, temperature 98.6 F (37 C), temperature source Oral, resp. rate 18, height 5\' 1"  (1.549 m), weight 66.225 kg (146 lb).Body mass index is 27.6 kg/(m^2).  See Admit Note MSE   COGNITIVE FEATURES THAT CONTRIBUTE TO RISK:  Closed-mindedness    SUICIDE RISK:   Moderate:  Frequent suicidal ideation with limited intensity, and duration, some specificity in terms of plans, no associated intent, good self-control, limited dysphoria/symptomatology, some risk factors present, and identifiable protective factors, including available and accessible social support.  PLAN OF CARE: Patient will be admitted to inpatient psychiatric unit for stabilization and safety. Will provide and encourage milieu participation. Provide medication management and maked adjustments as needed. Medication management to minimize risk of alcohol withdrawal.  Will follow daily.    I certify that inpatient services furnished can reasonably be expected to improve the patient's condition.  Talayia Hjort, [place] 03/29/2014, 1:53 PM

## 2014-03-29 NOTE — Progress Notes (Signed)
D) Pt. alert and calm at time of assessment. Cooperative with current treatment regimen. Denied A / V hallucinations. Contracts for safety while in hospital. CIWA assessment done as ordered. Pt. Declined PRN med for c/o headache 3/10, stated "no, I can tolerate it". Reported feeling "ok" and "focused" however, still have thoughts of drinking.  Attended scheduled groups. A) Meds given as per order. Encouragement and support offered. R) Pt. receptive to care. Maintained on Q 15 minutes checks as per order. Getting her needs met safely.

## 2014-03-29 NOTE — Progress Notes (Signed)
Pt is new to the unit this afternoon.  Pt states she has been drinking for years.  She says she was not trying to kill herself when she took the Celexa, but was trying to deal with her grief and feelings over her father's death.  Pt still denies SI/HI/AV.  She says she has been through detox before.  She says she knows she needs to become sober for herself and her family.  Pt attended evening group and participated.  Pt was encouraged to make her needs known to staff.  Support and encouragement offered.  Safety maintained with q15 minute checks.

## 2014-03-30 MED ORDER — SERTRALINE HCL 50 MG PO TABS
50.0000 mg | ORAL_TABLET | Freq: Every day | ORAL | Status: DC
  Administered 2014-03-30 – 2014-04-01 (×3): 50 mg via ORAL
  Filled 2014-03-30 (×4): qty 1
  Filled 2014-03-30: qty 4
  Filled 2014-03-30: qty 1
  Filled 2014-03-30: qty 4

## 2014-03-30 NOTE — Progress Notes (Signed)
Patient ID: [name], female   DOB: 10-26-1955, 58 y.o.   MRN: [id number] She haas been up and to groups interacting with staff and peers. Has attended groups. Denies SI thoughts and withdrawals. Depression 3. Hopelessness 2, anxiety 3, .

## 2014-03-30 NOTE — [facility] Group Notes (Signed)
   [facility] LCSW Aftercare Discharge Planning Group Note  03/30/2014  8:45 AM   Participation Quality: Alert, Appropriate and Oriented  Mood/Affect: Depressed and Flat  Depression Rating: 3 or 4  Anxiety Rating: 0  Thoughts of Suicide: Pt denies SI/HI  Will you contract for safety? Yes  Current AVH: Pt denies  Plan for Discharge/Comments: Pt attended discharge planning group and actively participated in group. CSW provided pt with today's workbook. Patient reports that she feels "good" today. She reports low levels of depression and anxiety this morning. She would like a list of private therapists to follow up with on her own and would like to follow up with her PCP Dr. Kenton Kingfisher for med management.  Transportation Means: Pt reports access to transportation  Supports: No supports mentioned at this time  Tilden Fossa, MSW, [place] Social Worker [employer] [phone]

## 2014-03-30 NOTE — Progress Notes (Signed)
[facility] MD Progress Note  03/30/2014 12:43 PM [name]  MRN:  [id number] Subjective:  " I'm feeling depressed" Objective: Patient reports increased feelings of depression yesterday and today , which she attributes to recent death of her father and " coming off the alcohol". She denies suicidal ideations, and has not exhibited any self injurious behaviors on unit. She has been going to groups and is visible in milieu: staff has noted her to be depressed and flat in affect. She is having some relatively mild alcohol withdrawal symptoms, such as vague/mild headache, and minor distal tremors. She does not appear to be in any acute distress. No alcohol cravings at this time. No medication side effects.    Diagnosis:  Alcohol Dependence, Alcohol withdrawal, mild at this time, Depression NOS, consider MDD versus Alcohol Induced Mood Disorder    Total Time spent with patient: 25 minutes     ADL's: fair   Sleep: good   Appetite: fair   Suicidal Ideation:  Denies any plan or intention of hurting self at this time  Homicidal Ideation:  Denies  AEB (as evidenced by):  Psychiatric Specialty Exam: Physical Exam  Review of Systems  Constitutional: Negative for fever and chills.  Respiratory: Negative for cough and shortness of breath.   Cardiovascular: Negative for chest pain.  Gastrointestinal: Negative for nausea and vomiting.  Genitourinary: Negative for dysuria, urgency and frequency.  Skin: Negative for rash.  Psychiatric/Behavioral: Positive for depression and substance abuse.    Blood pressure 115/87, pulse 85, temperature 97.7 F (36.5 C), temperature source Oral, resp. rate 18, height 5\' 1"  (1.549 m), weight 66.225 kg (146 lb).Body mass index is 27.6 kg/(m^2).  General Appearance: Fairly Groomed  Engineer, water::  Good  Speech:  Normal Rate  Volume:  Normal  Mood:  Depressed  Affect:  constricted but reactive   Thought Process:  Goal Directed and Linear  Orientation:   Other:  fully alert and attentive   Thought Content:  no hallucinations, no delusions  Suicidal Thoughts:  No- denies any plan or intention of hurting self or anyone else and contracts for safety at this time  Homicidal Thoughts:  No  Memory:  recent and remote grossly intact   Judgement:  Fair  Insight:  Fair  Psychomotor Activity:  Normal  Concentration:  Good  Recall:  Good  Fund of Knowledge:Good  Language: Good  Akathisia:  Negative  Handed:  Right  AIMS (if indicated):     Assets:  Communication Skills Desire for Improvement Resilience  Sleep:  Number of Hours: 6.5   Musculoskeletal: Strength & Muscle Tone: within normal limits Gait & Station: normal Patient leans: N/A  Current Medications: Current Facility-Administered Medications  Medication Dose Route Frequency Provider Last Rate Last Dose  . acamprosate (CAMPRAL) tablet 666 mg  666 mg Oral TID WC Neita Garnet, MD   666 mg at 03/30/14 1150  . acetaminophen (TYLENOL) tablet 650 mg  650 mg Oral Q6H PRN Kathlee Nations, MD      . alum & mag hydroxide-simeth (MAALOX/MYLANTA) 200-200-20 MG/5ML suspension 30 mL  30 mL Oral Q4H PRN Kathlee Nations, MD      . chlordiazePOXIDE (LIBRIUM) capsule 25 mg  25 mg Oral Q6H PRN Kathlee Nations, MD      . chlordiazePOXIDE (LIBRIUM) capsule 25 mg  25 mg Oral TID Kathlee Nations, MD   25 mg at 03/30/14 1151   Followed by  . [START ON 03/31/2014] chlordiazePOXIDE (LIBRIUM) capsule 25  mg  25 mg Oral BH-qamhs Kathlee Nations, MD       Followed by  . [START ON 04/01/2014] chlordiazePOXIDE (LIBRIUM) capsule 25 mg  25 mg Oral Daily Kathlee Nations, MD      . hydrOXYzine (ATARAX/VISTARIL) tablet 25 mg  25 mg Oral Q6H PRN Kathlee Nations, MD   25 mg at 03/29/14 2150  . loperamide (IMODIUM) capsule 2-4 mg  2-4 mg Oral PRN Kathlee Nations, MD      . magnesium hydroxide (MILK OF MAGNESIA) suspension 30 mL  30 mL Oral Daily PRN Kathlee Nations, MD      . multivitamin with minerals tablet 1 tablet  1 tablet Oral Daily  Kathlee Nations, MD   1 tablet at 03/30/14 0750  . nicotine (NICODERM CQ - dosed in mg/24 hours) patch 21 mg  21 mg Transdermal Q0600 Kathlee Nations, MD   21 mg at 03/30/14 0605  . ondansetron (ZOFRAN-ODT) disintegrating tablet 4 mg  4 mg Oral Q6H PRN Kathlee Nations, MD      . sertraline (ZOLOFT) tablet 50 mg  50 mg Oral Daily Neita Garnet, MD   50 mg at 03/30/14 1151  . thiamine (VITAMIN B-1) tablet 100 mg  100 mg Oral Daily Kathlee Nations, MD   100 mg at 03/30/14 0750    Lab Results: No results found for this or any previous visit (from the past 48 hour(s)).  Physical Findings: AIMS: Facial and Oral Movements Muscles of Facial Expression: None, normal Lips and Perioral Area: None, normal Jaw: None, normal Tongue: None, normal,Extremity Movements Upper (arms, wrists, hands, fingers): None, normal Lower (legs, knees, ankles, toes): None, normal, Trunk Movements Neck, shoulders, hips: None, normal, Overall Severity Severity of abnormal movements (highest score from questions above): None, normal Incapacitation due to abnormal movements: None, normal Patient's awareness of abnormal movements (rate only patient's report): No Awareness, Dental Status Current problems with teeth and/or dentures?: No Does patient usually wear dentures?: No  CIWA:  CIWA-Ar Total: 1 COWS:     Assessment: Patient remains depressed and sad, but is not suicidal or psychotic. Mild alcohol withdrawal symptoms at present, still somewhat tremulous, but vitals are stable. Tolerating medications well at present.   Treatment Plan Summary: Daily contact with patient to assess and evaluate symptoms and progress in treatment Medication management See below   Plan: Continue inpatient treatment, support, milieu Librium taper as per alcohol withdrawal protocol Campral 666 mgrs TID Zoloft 50 mgrs QDAY  Will order TSH to rule out potential hypothyroidism ( patient states she does not remember having thyroid function  monitored and complains of low energy , depression)    Medical Decision Making Problem Points:  Established problem, stable/improving (1), Review of last therapy session (1) and Review of psycho-social stressors (1) Data Points:  Review or order clinical lab tests (1) Review of medication regiment & side effects (2)  I certify that inpatient services furnished can reasonably be expected to improve the patient's condition.   COBOS, FERNANDO 03/30/2014, 12:43 PM

## 2014-03-30 NOTE — Progress Notes (Signed)
Attended group 

## 2014-03-30 NOTE — Progress Notes (Addendum)
D: Pt is currently denying any withdrawal symptoms. Pt reports having a plan of becoming less isolative during her stay here.  Pt was observed actively participating within the milieu. Pt spent the majority of the shift outside of her room.  Pt plans to join a faith based support group at her current church. She reports having a good support sytem with multiple family members nearby. Pt is currently denying any SI/HI with no reports of AVH.  D: Pt is in affect and in mood. Pt rates her depression at a level out of ten and her anxiety as a . Pt attended group this evening. Pt observed interacting appropriately within the milieu.  A: Continued support and availability as needed was extended to this pt. Staff continues to monitor pt with q37min checks.  R: No adverse drug reactions noted. Pt receptive to treatment. Pt remains safe at this time.

## 2014-03-30 NOTE — Plan of Care (Signed)
Problem: Alteration in mood & ability to function due to Goal: LTG-Patient demonstrates decreased signs of withdrawal Pt reporting mild withdrawals at this morning with CIWA score of 2 and stable vitals. Goal progressing. Quesada, [place] 03/29/2014 1:05 PM  Outcome: Progressing Pt is reporting no active withdrawals symptoms at this time.  Vitals WDL. No anxiety inferred or verbally endorsed by pt.

## 2014-03-30 NOTE — [facility] Group Notes (Signed)
[place] LCSW Group Therapy 03/30/2014  1:15 PM Type of Therapy: Group Therapy Participation Level: Active  Participation Quality: Attentive, Sharing and Supportive  Affect: Depressed and Flat  Cognitive: Alert and Oriented  Insight: Developing/Improving and Engaged  Engagement in Therapy: Developing/Improving and Engaged  Modes of Intervention: Clarification, Confrontation, Discussion, Education, Exploration, Limit-setting, Orientation, Problem-solving, Rapport Building, Art therapist, Socialization and Support  Summary of Progress/Problems: The topic for group today was emotional regulation. This group focused on both positive and negative emotion identification and allowed group members to process ways to identify feelings, regulate negative emotions, and find healthy ways to manage internal/external emotions. Group members were asked to reflect on a time when their reaction to an emotion led to a negative outcome and explored how alternative responses using emotion regulation would have benefited them. Group members were also asked to discuss a time when emotion regulation was utilized when a negative emotion was experienced. Patient reports that she handles the stress of her job and relationship with her mother by drinking. Patient was able to identify alternative coping strategies such as going to the gym with her son. CSW's and other group members provided emotional support and encouragement to patient.  Tilden Fossa, MSW, [place] Worker [facility] [phone]

## 2014-03-31 DIAGNOSIS — F10239 Alcohol dependence with withdrawal, unspecified: Secondary | ICD-10-CM

## 2014-03-31 DIAGNOSIS — F1024 Alcohol dependence with alcohol-induced mood disorder: Secondary | ICD-10-CM

## 2014-03-31 DIAGNOSIS — F329 Major depressive disorder, single episode, unspecified: Principal | ICD-10-CM

## 2014-03-31 DIAGNOSIS — E039 Hypothyroidism, unspecified: Secondary | ICD-10-CM

## 2014-03-31 LAB — TSH: TSH: 8.75 u[IU]/mL — ABNORMAL HIGH (ref 0.350–4.500)

## 2014-03-31 MED ORDER — HYDROXYZINE HCL 25 MG PO TABS
25.0000 mg | ORAL_TABLET | Freq: Every evening | ORAL | Status: DC | PRN
  Administered 2014-03-31: 25 mg via ORAL

## 2014-03-31 MED ORDER — LEVOTHYROXINE SODIUM 25 MCG PO TABS: 25.0000 ug | ORAL_TABLET | Freq: Every day | ORAL | Status: DC

## 2014-03-31 MED ORDER — LEVOTHYROXINE SODIUM 50 MCG PO TABS
50.0000 ug | ORAL_TABLET | Freq: Every day | ORAL | Status: DC
Start: 2014-04-01 — End: 2014-04-01
  Administered 2014-04-01: 50 ug via ORAL
  Filled 2014-03-31: qty 4
  Filled 2014-03-31: qty 2
  Filled 2014-03-31 (×2): qty 1
  Filled 2014-03-31: qty 4

## 2014-03-31 NOTE — Progress Notes (Signed)
[place] Group Notes:  (Nursing/MHT/Case Management/Adjunct)  Date:  03/31/2014  Time:  2100  Type of Therapy:  wrap up group  Participation Level:  Active  Participation Quality:  Appropriate, Attentive, Sharing and Supportive  Affect:  Appropriate  Cognitive:  Appropriate  Insight:  Good  Engagement in Group:  Engaged  Modes of Intervention:  Clarification, Education and Support  Summary of Progress/Problems: Pt reports having been in a hole with alcohol and pills. Pt shares that she isolates and hides her use. Pt plans on returning home where her family is now aware of her behavior and addiction and is very supportive of her sobriety.  Pt plans on focusing on her children, two dogs, and exercise.    Jacques Navy 03/31/2014, 11:38 PM

## 2014-03-31 NOTE — Consult Note (Signed)
[facility]   Patient Demographics  [name], is a 58 y.o. female  CSN: [id number]  MRN: [id number]  DOB - Oct 29, 1955  Admit date - 03/28/2014  Admitting Physician Nicholaus Bloom, MD  Outpatient Primary MD for the patient is Vidal Schwalbe, MD  LOS - 3   Consult requested in the Hospital by Nicholaus Bloom, MD, On 03/31/2014    Reason for consult : elevated TSH    Assessment & Plan   Active Problems:   Alcohol abuse   Hypothyroidism   Depression  Elevated TSH/hypothyroidism: Patient presents with symptoms of fatigue, weakness, weight gain, has elevated TSH, she will be started on Synthroid 50 mcg oral daily, with recommendation to repeat TSH in 6 weeks as an outpatient, she was instructed to report any anxiety, agitation, or chest pain.  History of alcohol dependence Management as per primary psychiatric.  History of depression Management as per psychiatric   DVT Prophylaxis SCDs  Please review my Orders  Family Communication - Plan of care including tests being ordered have been discussed with the patient and who indicate understanding and agree with the plan  Thank you for the consult, we will follow the patient with you in the Hospital.   With History of -  Active Problems:   Alcohol abuse   Hypothyroidism   Depression   Past Medical History  Diagnosis Date  . Hypertension   . Depression   . Suicidal overdose   . Alcoholism      Past Surgical History  Procedure Laterality Date  . Fracture surgery      fractured left ankle 2007  . Ganglion cyst excision      left foot    Past Surgical History  Procedure Laterality Date  . Fracture surgery      fractured left ankle 2007  . Ganglion cyst excision      left foot    HPI:-  [name] [id number] is a 58 y.o. female,under psychiatric care due to to alcohol dependence/alcohol withdrawal problem, as well it due to depressive  disorder, medicine were consulted to evaluate for elevated TSH, and reports feeling fatigue, generalized weakness, recent weight gain, so her TSH was checked and was elevated at 8.7, she denies any family history of hypothyroidism, denies any thyroid problem in the past, if she has primary care physician but reports she never had her thyroid level checked, denies any fever, palpitation, diaphoresis, weight loss.      Review of Systems    In addition to the HPI above,  No Fever-chills, No Headache, No changes with Vision or hearing, No problems swallowing food or Liquids, No Chest pain, Cough or Shortness of Breath, No Abdominal pain, No Nausea or Vommitting, Bowel movements are regular, No Blood in stool or Urine, No dysuria, No new skin rashes or bruises, No new joints pains-aches,  No new weakness, tingling, numbness in any extremity, and platelets of generalized weakness and fatigue . No recent weight gain or loss, No polyuria, polydypsia or polyphagia, No significant Mental Stressors.  A full 10 point review of systems was obtained except as dictated above all other review of systems are negative.     Social History History  Substance Use Topics  . Smoking status: Current Every Day Smoker -- 1.00 packs/day for 5 years    Types: Cigarettes  . Smokeless tobacco: Never Used  . Alcohol Use: 7.2 oz/week    12 Cans of beer per week     Comment: daily  6-7 cans of beer     Family History History reviewed. No pertinent family history.   Prior to Admission medications   Medication Sig Start Date End Date Taking? Authorizing Provider  amLODipine (NORVASC) 10 MG tablet Take 1 tablet (10 mg total) by mouth daily. For high blood pressure 12/12/11  Yes Encarnacion Slates, NP  citalopram (CELEXA) 20 MG tablet Take 1 tablet (20 mg total) by mouth daily. 12/08/13  Yes Benjamine Mola, FNP    No Known Allergies   Physical Exam   Vitals  Blood pressure 104/57, pulse 88, temperature  98.7 F (37.1 C), temperature source Oral, resp. rate 16, height 5\' 1"  (1.549 m), weight 66.225 kg (146 lb), SpO2 97.00%.   1. General well-nourished female lying in bed in NAD,   2. Normal affect and insight, Not Suicidal or Homicidal, Awake Alert, Oriented X 3.  3. No F.N deficits, ALL C.Nerves Intact, Strength 5/5 all 4 extremities, Sensation intact all   4 extremities, Plantars down going.  4. Ears and Eyes appear Normal, Conjunctivae clear, PERRLA. Moist Oral Mucosa.  5. Supple Neck, No JVD, No cervical lymphadenopathy appriciated, No Carotid Bruits.  6. Symmetrical Chest wall movement, Good air movement bilaterally, CTAB.  7. RRR, No Gallops, Rubs or Murmurs, No Parasternal Heave.  8. Positive Bowel Sounds, Abdomen Soft, Non tender, No organomegaly appriciated, No rebound -guarding or rigidity.  9.  No Cyanosis, Normal Skin Turgor, No Skin Rash or Bruise.  10. Good muscle tone,  joints appear normal , no effusions, Normal ROM.  11. No Palpable Lymph Nodes in Neck or Axillae   Data Review  CBC  Recent Labs Lab 03/27/14 2338  WBC 4.9  HGB 14.0  HCT 39.5  PLT 321  MCV 86.4  MCH 30.6  MCHC 35.4  RDW 12.4  LYMPHSABS 2.2  MONOABS 0.4  EOSABS 0.1  BASOSABS 0.1    Chemistries   Recent Labs Lab 03/27/14 2338  NA 140  K 3.5*  CL 102  CO2 24  GLUCOSE 93  BUN 9  CREATININE 0.49*  CALCIUM 8.4  AST 29  ALT 43*  ALKPHOS 93  BILITOT <0.2*   ------------------------------------------------------------------------------------------------------------------ estimated creatinine clearance is 69.2 ml/min (by C-G formula based on Cr of 0.49). ------------------------------------------------------------------------------------------------------------------ No results found for this basename: HGBA1C,  in the last 72 hours ------------------------------------------------------------------------------------------------------------------ No results found for this  basename: CHOL, HDL, LDLCALC, TRIG, CHOLHDL, LDLDIRECT,  in the last 72 hours ------------------------------------------------------------------------------------------------------------------  Recent Labs  03/31/14 0625  TSH 8.750*   ------------------------------------------------------------------------------------------------------------------ No results found for this basename: VITAMINB12, FOLATE, FERRITIN, TIBC, IRON, RETICCTPCT,  in the last 72 hours  Coagulation profile No results found for this basename: INR, PROTIME,  in the last 168 hours  No results found for this basename: DDIMER,  in the last 72 hours  Cardiac Enzymes No results found for this basename: CK, CKMB, TROPONINI, MYOGLOBIN,  in the last 168 hours ------------------------------------------------------------------------------------------------------------------ No components found with this basename: POCBNP,   ------------------------------------------------------------------------------------------------------------------- Micro Results No results found for this or any previous visit (from the past 240 hour(s)). ------------------------------------------------------------------------------------------------------------------ Radiology Reports No results found.  Scheduled Meds: . acamprosate  666 mg Oral TID WC  . chlordiazePOXIDE  25 mg Oral BH-qamhs   Followed by  . [START ON 04/01/2014] chlordiazePOXIDE  25 mg Oral Daily  . [START ON 04/01/2014] levothyroxine  50 mcg Oral QAC breakfast  . multivitamin with minerals  1 tablet Oral Daily  . nicotine  21 mg Transdermal Q0600  .  sertraline  50 mg Oral Daily  . thiamine  100 mg Oral Daily   Continuous Infusions:  PRN Meds:.acetaminophen, alum & mag hydroxide-simeth, magnesium hydroxide  Antibiotics.  Anti-infectives   None     Thank you for this consult, we will sign off, please reconsult [place] for any further recommendation regarding any medical  issues.  Time Spent in minutes 40 minutes  Annamary Buschman M.D on 03/31/2014 at 7:11 PM  [place]  [phone]

## 2014-03-31 NOTE — Progress Notes (Signed)
The focus of this group is to educate the patient on the purpose and policies of crisis stabilization and provide a format to answer questions about their admission.  The group details unit policies and expectations of patients while admitted.  Patient attended 0900 nurse education orientation group this morning.  Patient actively participated, appropriate affect, alert, appropriate insight and engagement.  Patient will work on discharge goals today.

## 2014-03-31 NOTE — [facility] Group Notes (Signed)
[place] LCSW Group Therapy 03/31/2014 1:15 PM Type of Therapy: Group Therapy Participation Level: Active  Participation Quality: Attentive, Sharing and Supportive  Affect: Depressed and Flat  Cognitive: Alert and Oriented  Insight: Developing/Improving and Engaged  Engagement in Therapy: Developing/Improving and Engaged  Modes of Intervention: Activity, Clarification, Confrontation, Discussion, Education, Exploration, Limit-setting, Orientation, Problem-solving, Rapport Building, Art therapist, Socialization and Support  Summary of Progress/Problems: Patient was attentive and engaged with speaker from [place]. Patient was attentive to speaker while they shared their story of dealing with mental health and overcoming it. Patient expressed interest in their programs and services and received information on their agency. Patient processed ways they can relate to the speaker.   Tilden Fossa, MSW, [place] Worker [facility] [phone]

## 2014-03-31 NOTE — Progress Notes (Signed)
Patient ID: [name], female   DOB: [birth year], 58 y.o.   MRN: [id number] [facility] MD Progress Note  03/31/2014 3:59 PM [name]  MRN:  [id number] Subjective: Patient states she is feeling better today Objective: Today patient presents with significant improvement. She is describing improved mood and presents with a fuller range of affect. She is not presenting with any current significant symptoms of withdrawal. Vitals are stable. She is visible on dayroom/milieu, and has been going to groups/milieu No medication side effects.  Denies cravings for alcohol and states that she feels Campral is working. As she improves , she is focusing more on disposition plans- she does not want to go to a Rehab, but plans to go to [facility] and is agreeing to referral to outpatient psychiatric services. She is focused on returning to work soon. TSH is increased.    Diagnosis:  Alcohol Dependence, Alcohol withdrawal, mild at this time, Depression NOS, consider MDD versus Alcohol Induced Mood Disorder    Total Time spent with patient: 25 minutes     ADL's:improved  Sleep: good   Appetite: improved    Suicidal Ideation:  Denies any plan or intention of hurting self at this time  Homicidal Ideation:  Denies  AEB (as evidenced by):  Psychiatric Specialty Exam: Physical Exam  Review of Systems  Constitutional: Negative for fever and chills.  Respiratory: Negative for cough and shortness of breath.   Cardiovascular: Negative for chest pain.  Gastrointestinal: Negative for nausea and vomiting.  Genitourinary: Negative for dysuria, urgency and frequency.  Skin: Negative for rash.  Psychiatric/Behavioral: Positive for depression and substance abuse.    Blood pressure 112/80, pulse 81, temperature 98 F (36.7 C), temperature source Oral, resp. rate 20, height 5\' 1"  (1.549 m), weight 66.225 kg (146 lb).Body mass index is 27.6 kg/(m^2).  General Appearance: Well Groomed  Engineer, water::   Good  Speech:  Normal Rate  Volume:  Normal  Mood:  mood improved   Affect:  affect improved, more reactive   Thought Process:  Goal Directed and Linear  Orientation:  Other:  fully alert and attentive   Thought Content:  no hallucinations, no delusions  Suicidal Thoughts:  No- denies any plan or intention of hurting self or anyone else and contracts for safety at this time  Homicidal Thoughts:  No  Memory:  recent and remote grossly intact   Judgement:  Fair  Insight:  Fair  Psychomotor Activity:  Normal  Concentration:  Good  Recall:  Good  Fund of Knowledge:Good  Language: Good  Akathisia:  Negative  Handed:  Right  AIMS (if indicated):     Assets:  Communication Skills Desire for Improvement Resilience  Sleep:  Number of Hours: 6.75   Musculoskeletal: Strength & Muscle Tone: within normal limits Gait & Station: normal Patient leans: N/A  Current Medications: Current Facility-Administered Medications  Medication Dose Route Frequency Provider Last Rate Last Dose  . acamprosate (CAMPRAL) tablet 666 mg  666 mg Oral TID WC Neita Garnet, MD   666 mg at 03/31/14 1152  . acetaminophen (TYLENOL) tablet 650 mg  650 mg Oral Q6H PRN Kathlee Nations, MD      . alum & mag hydroxide-simeth (MAALOX/MYLANTA) 200-200-20 MG/5ML suspension 30 mL  30 mL Oral Q4H PRN Kathlee Nations, MD      . chlordiazePOXIDE (LIBRIUM) capsule 25 mg  25 mg Oral Q6H PRN Kathlee Nations, MD      . chlordiazePOXIDE (LIBRIUM) capsule 25  mg  25 mg Oral BH-qamhs Kathlee Nations, MD   25 mg at 03/31/14 0834   Followed by  . [START ON 04/01/2014] chlordiazePOXIDE (LIBRIUM) capsule 25 mg  25 mg Oral Daily Kathlee Nations, MD      . hydrOXYzine (ATARAX/VISTARIL) tablet 25 mg  25 mg Oral Q6H PRN Kathlee Nations, MD   25 mg at 03/30/14 2157  . loperamide (IMODIUM) capsule 2-4 mg  2-4 mg Oral PRN Kathlee Nations, MD      . magnesium hydroxide (MILK OF MAGNESIA) suspension 30 mL  30 mL Oral Daily PRN Kathlee Nations, MD      .  multivitamin with minerals tablet 1 tablet  1 tablet Oral Daily Kathlee Nations, MD   1 tablet at 03/31/14 0834  . nicotine (NICODERM CQ - dosed in mg/24 hours) patch 21 mg  21 mg Transdermal Q0600 Kathlee Nations, MD   21 mg at 03/31/14 0830  . ondansetron (ZOFRAN-ODT) disintegrating tablet 4 mg  4 mg Oral Q6H PRN Kathlee Nations, MD      . sertraline (ZOLOFT) tablet 50 mg  50 mg Oral Daily Neita Garnet, MD   50 mg at 03/31/14 0834  . thiamine (VITAMIN B-1) tablet 100 mg  100 mg Oral Daily Kathlee Nations, MD   100 mg at 03/31/14 2992    Lab Results:  Results for orders placed during the hospital encounter of 03/28/14 (from the past 48 hour(s))  TSH     Status: Abnormal   Collection Time    03/31/14  6:25 AM      Result Value Ref Range   TSH 8.750 (*) 0.350 - 4.500 uIU/mL   Comment: Performed at [facility]    Physical Findings: AIMS: Facial and Oral Movements Muscles of Facial Expression: None, normal Lips and Perioral Area: None, normal Jaw: None, normal Tongue: None, normal,Extremity Movements Upper (arms, wrists, hands, fingers): None, normal Lower (legs, knees, ankles, toes): None, normal, Trunk Movements Neck, shoulders, hips: None, normal, Overall Severity Severity of abnormal movements (highest score from questions above): None, normal Incapacitation due to abnormal movements: None, normal Patient's awareness of abnormal movements (rate only patient's report): No Awareness, Dental Status Current problems with teeth and/or dentures?: No Does patient usually wear dentures?: No  CIWA:  CIWA-Ar Total: 1 COWS:     Assessment: Patient is improved today, with  Improved mood and a fuller range of affect . No current significant withdrawal symptoms. Tolerating medications well.   Treatment Plan Summary: Daily contact with patient to assess and evaluate symptoms and progress in treatment Medication management See below   Plan: Continue inpatient treatment, support,  milieu Consider Discharge soon as she continues to improve Completing Librium taper Campral 666 mgrs TID Zoloft 50 mgrs QDAY  Will request hospitalist consult to consider initiating treatment for hypothyroidism   Medical Decision Making Problem Points:  Established problem, stable/improving (1), Review of last therapy session (1) and Review of psycho-social stressors (1) Data Points:  Review or order clinical lab tests (1) Review of medication regiment & side effects (2)  I certify that inpatient services furnished can reasonably be expected to improve the patient's condition.   COBOS, [place] 03/31/2014, 3:59 PM

## 2014-03-31 NOTE — Progress Notes (Signed)
D: Patient denies SI. Voices no complaints. Has a plan to go to faith-based therapy at her church after discharge. A: Monitor for safety. R: Safety maintained. Libby Maw, RN

## 2014-03-31 NOTE — BHH Suicide Risk Assessment (Signed)
[place] INPATIENT:  Family/Significant Other Suicide Prevention Education  Suicide Prevention Education:  Education Completed; Mother Darnelle Going [phone],  (name of family member/significant other) has been identified by the patient as the family member/significant other with whom the patient will be residing, and identified as the person(s) who will aid the patient in the event of a mental health crisis (suicidal ideations/suicide attempt).  With written consent from the patient, the family member/significant other has been provided the following suicide prevention education, prior to the and/or following the discharge of the patient.  The suicide prevention education provided includes the following:  Suicide risk factors  Suicide prevention and interventions  National Suicide Hotline telephone number  [facility] assessment telephone number  [facility] Emergency Assistance [place] and/or Residential Mobile Crisis Unit telephone number  Request made of family/significant other to:  Remove weapons (e.g., guns, rifles, knives), all items previously/currently identified as safety concern.    Remove drugs/medications (over-the-counter, prescriptions, illicit drugs), all items previously/currently identified as a safety concern.  The family member/significant other verbalizes understanding of the suicide prevention education information provided.  The family member/significant other agrees to remove the items of safety concern listed above.  Shamecca Whitebread, Casimiro Needle 03/31/2014, 4:13 PM

## 2014-04-01 DIAGNOSIS — F1020 Alcohol dependence, uncomplicated: Secondary | ICD-10-CM

## 2014-04-01 DIAGNOSIS — F1994 Other psychoactive substance use, unspecified with psychoactive substance-induced mood disorder: Secondary | ICD-10-CM

## 2014-04-01 MED ORDER — ACAMPROSATE CALCIUM 333 MG PO TBEC: 666.0000 mg | DELAYED_RELEASE_TABLET | Freq: Three times a day (TID) | ORAL | Status: DC

## 2014-04-01 MED ORDER — HYDROXYZINE HCL 25 MG PO TABS: ORAL_TABLET | ORAL | Status: DC

## 2014-04-01 MED ORDER — HYDROXYZINE HCL 25 MG PO TABS
25.0000 mg | ORAL_TABLET | Freq: Three times a day (TID) | ORAL | Status: DC | PRN
  Filled 2014-04-01: qty 12

## 2014-04-01 MED ORDER — SERTRALINE HCL 50 MG PO TABS: 50.0000 mg | ORAL_TABLET | Freq: Every day | ORAL | Status: DC

## 2014-04-01 MED ORDER — LEVOTHYROXINE SODIUM 50 MCG PO TABS: 50.0000 ug | ORAL_TABLET | Freq: Every day | ORAL | Status: DC

## 2014-04-01 NOTE — BHH Group Notes (Signed)
[facility] LCSW Aftercare Discharge Planning Group Note  04/01/2014  8:45 AM  Participation Quality: Did attended but left at beginning of group to speak with MD. Did not return.  Tilden Fossa, MSW, [place] Worker [facility] [phone]

## 2014-04-01 NOTE — Tx Team (Signed)
Interdisciplinary Treatment Plan Update (Adult) Date: 04/01/2014   Time Reviewed: 9:30 AM  Progress in Treatment: Attending groups: Yes Participating in groups: Yes Taking medication as prescribed: Yes Tolerating medication: Yes Family/Significant other contact made: Yes Patient understands diagnosis: Yes Discussing patient identified problems/goals with staff: Yes Medical problems stabilized or resolved: Yes Denies suicidal/homicidal ideation: Yes Issues/concerns per patient self-inventory: Yes Other:  New problem(s) identified: N/A  Discharge Plan or Barriers: Patient to discharge home today 10/2. She plans to follow up with her PCP [place] for medication management and has requested to find her own outpatient counselor. CSW provided patient with listing of local outpatient providers.   Reason for Continuation of Hospitalization:  Depression Anxiety Medication Stabilization   Comments: N/A  Estimated length of stay: Discharge today 10/2  For review of initial/current patient goals, please see plan of care.  Attendees:  Patient:    Family:    Physician: Dr. Parke Poisson; Dr. Sabra Heck  04/01/2014 9:30 AM   Nursing: Eduard Roux; Marilynne Halsted; Vira Browns , RN  04/01/2014 9:30 AM   Clinical Social Worker: Tilden Fossa, LCSWA  04/01/2014 9:30 AM   Other: Joette Catching, LCSW  04/01/2014 9:30 AM   Other: Lucinda Dell, [place] Liaison  04/01/2014 9:30 AM   Other: Lars Pinks, Case Manager  04/01/2014 9:30 AM   Other: Norberto Sorenson, [place]  04/01/2014 9:30 AM   Other:    Other:    Other:    Other:    Other:        Scribe for Treatment Team:  Tilden Fossa, MSW, [employer] [phone]

## 2014-04-01 NOTE — Progress Notes (Signed)
Patient discharged per physician order; patient denies SI/HI and A/V hallucinations; patient received samples, prescriptions, and copy of AVS after it was reviewed; patient had no other questions or concerns at this time; patient verbalized and signed that she received all belongings; patient left the unit ambulatory  

## 2014-04-01 NOTE — Progress Notes (Signed)
D: Pt denies SI/HI/AVH. Pt is pleasant and cooperative. Pt said she was doing ok, but had problems sleeping.   A: Pt was offered support and encouragement. Pt was given scheduled medications. Pt was encourage to attend groups. Q 15 minute checks were done for safety.  R:Pt attends groups and interacts well with peers and staff. Pt is taking medication. Pt receptive to treatment and safety maintained on unit.

## 2014-04-01 NOTE — BHH Suicide Risk Assessment (Signed)
Demographic Factors:  58 year old employed female, lives alone.  Total Time spent with patient: 30 minutes  Psychiatric Specialty Exam: Physical Exam  ROS  Blood pressure 128/86, pulse 71, temperature 97.4 F (36.3 C), temperature source Oral, resp. rate 16, height 5\' 1"  (1.549 m), weight 66.225 kg (146 lb), SpO2 97.00%.Body mass index is 27.6 kg/(m^2).  General Appearance: Well Groomed  Engineer, water::  Good  Speech:  Normal Rate  Volume:  Normal  Mood:  Euthymic  Affect:  Appropriate  Thought Process:  Goal Directed and Linear  Orientation:  Full (Time, Place, and Person)  Thought Content:  no hallucinations, no delusions  Suicidal Thoughts:  No- patient denies any thoughts of hurting herself or anyone else   Homicidal Thoughts:  No  Memory:  recent and remote grossly intact  Judgement:  Good  Insight:  Fair  Psychomotor Activity:  Normal  Concentration:  Good  Recall:  Good  Fund of Knowledge:Good  Language: Good  Akathisia:  Negative  Handed:  Right  AIMS (if indicated):     Assets:  Communication Skills Desire for Improvement Housing Vocational/Educational  Sleep:  Number of Hours: 6.75    Musculoskeletal: Strength & Muscle Tone: within normal limits Gait & Station: normal Patient leans: N/A   Mental Status Per Nursing Assessment::   On Admission:  NA  Current Mental Status by Physician: At this time patient is doing well and is eurhymic, with a full range of affect. She is not suicidal or homicidal and is future oriented, planning on returning home and looking forward to returning to work next week. She is not having any ongoing WDL symptoms at the present time.  Loss Factors: Recent death of her father   Historical Factors: History of alcohol dependence, history of prior overdose which occurred in the context of intoxicagtion  Risk Reduction Factors:   Sense of responsibility to family, Employed and Positive coping skills or problem solving  skills  Continued Clinical Symptoms:  At this time patient is improved, with an improved mood and affect, she is future oriented, looking forward to returning to work soon. She is not presenting with any residual alcohol withdrawal at the present time  Cognitive Features That Contribute To Risk:  No gross cognitive deficits noted upon discharge- she presents fully alert and oriented x 3  Suicide Risk:  Mild:  Suicidal ideation of limited frequency, intensity, duration, and specificity.  There are no identifiable plans, no associated intent, mild dysphoria and related symptoms, good self-control (both objective and subjective assessment), few other risk factors, and identifiable protective factors, including available and accessible social support.  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 Hypertension   . Depression   . Suicidal overdose   . Alcoholism    AXIS IV:  Recent death of father AXIS V:  61-70 mild symptoms  Plan Of Care/Follow-up recommendations:  Activity:  As tolerated Diet:  Heart healthy, low sodium Tests:  NA Other:  See below  Is patient on multiple antipsychotic therapies at discharge:  No   Has Patient had three or more failed trials of antipsychotic monotherapy by history:  No  Recommended Plan for Multiple Antipsychotic Therapies: NA  Patient leaving unit in good spirits.Patient returning home. Will follow up wit h her PCP , Dr. Kenton Kingfisher, at [place], for medical issues, to include recently  diagnosed hypothyroidism. States that her PCP will also continue current psychiatric medications. Does not feel she needs outpatient psychiatric management at this time. As per SW she has been given list of local resources to seek psychotherapy/counseling services if she wants to initiate this treatment. Patient states she will be going to  [place] and a church based support group.   Follow up with [facility] On 05/11/2014. (Appointment for medication management and lab work at 11:45 am. Please call office if you need to reschedule.)  COBOS, [place] 04/01/2014, 9:15 AM

## 2014-04-01 NOTE — Progress Notes (Signed)
[facility] Adult Case Management Discharge Plan :  Will you be returning to the same living situation after discharge: Yes,  patient will return home. At discharge, do you have transportation home?:Yes,  patient reports that she will have transportation home Do you have the ability to pay for your medications:Yes,  patient will be provided with prescriptions at discharge.  Release of information consent forms completed and in the chart;  Patient's signature needed at discharge.  Patient to Follow up at: Follow-up Information   Follow up with [facility] On 05/11/2014. (Appointment for medication management and lab work at 11:45 am. Please call office if you need to reschedule.)    Contact information:   [address], [place] [address] [phone]      Patient denies SI/HI:   Yes,  denies    Safety Planning and Suicide Prevention discussed:  Yes,  with patient and mother  Richardo Priest 04/01/2014, 10:46 AM

## 2014-04-01 NOTE — Discharge Summary (Addendum)
Physician Discharge Summary Note  [name] is an 58 y.o., female MRN:  [id number] DOB:  [birth year] Patient phone:  [phone] (home)  Patient address:   [place] [address],  Total Time spent with patient: Greater than 30 minutes  Date of Admission:  03/28/2014 Date of Discharge: 04/01/14  Reason for Admission:  Alcohol dependence  Discharge Diagnoses: Active Problems:   Alcohol abuse   Hypothyroidism   Depression   Psychiatric Specialty Exam: Physical Exam  Psychiatric: Her speech is normal and behavior is normal. Judgment and thought content normal. Her mood appears not anxious. Her affect is not angry, not blunt, not labile and not inappropriate. Cognition and memory are normal. She does not exhibit a depressed mood.    Review of Systems  Constitutional: Negative.   HENT: Negative.   Eyes: Negative.   Respiratory: Negative.   Cardiovascular: Negative.   Gastrointestinal: Negative.   Genitourinary: Negative.   Musculoskeletal: Negative.   Skin: Negative.   Neurological: Negative.   Endo/Heme/Allergies: Negative.   Psychiatric/Behavioral: Positive for depression (Stable) and substance abuse (Alcoholism, chronic). Negative for suicidal ideas, hallucinations and memory loss. The patient is not nervous/anxious and does not have insomnia.     Blood pressure 128/86, pulse 71, temperature 97.4 F (36.3 C), temperature source Oral, resp. rate 16, height 5\' 1"  (1.549 m), weight 66.225 kg (146 lb), SpO2 97.00%.Body mass index is 27.6 kg/(m^2).  General Appearance: Well Groomed  Engineer, water:: Good  Speech: Normal Rate  Volume: Normal  Mood: Euthymic  Affect: Appropriate  Thought Process: Goal Directed and Linear  Orientation: Full (Time, Place, and Person)  Thought Content: no hallucinations, no delusions  Suicidal Thoughts: No- patient denies any thoughts of hurting herself or anyone else  Homicidal Thoughts: No  Memory: recent and remote  grossly intact  Judgement: Good  Insight: Fair  Psychomotor Activity: Normal  Concentration: Good  Recall: Good  Fund of Knowledge:Good  Language: Good  Akathisia: Negative  Handed: Right  AIMS (if indicated): Assets: Communication Skills  Desire for Improvement  Housing  Vocational/Educational  Sleep: Number of Hours: 6.75   Past Psychiatric History: Diagnosis: Alcohol dependence, Substance induced mood disorder  Hospitalizations: [facility] adult unit  Outpatient Care: [facility]  Substance Abuse Care: [facility]  Self-Mutilation: NA  Suicidal Attempts: NA  Violent Behaviors: NA   Musculoskeletal: Strength & Muscle Tone: within normal limits Gait & Station: normal Patient leans: N/A  DSM5: Schizophrenia Disorders:  NA Obsessive-Compulsive Disorders:  NA Trauma-Stressor Disorders:  NA Substance/Addictive Disorders:  Alcohol Related Disorder - Severe (303.90) Depressive Disorders:  Substance induced mood disorder  Axis Diagnosis:   AXIS I:  Alcohol dependence, Substance induced mood disorder AXIS II:  Deferred AXIS III:   Past Medical History  Diagnosis Date  . Hypertension   . Depression   . Suicidal overdose   . Alcoholism    AXIS IV:  other psychosocial or environmental problems and Alcoholism, chronic AXIS V:  63  Level of Care:  OP  Hospital Course:  I'm dependent on alcohol and I need to do something about it"  History of Present Illness:: 58 year old female who reports history of alcohol dependence, and has been drinking daily for several months. Has been drinking up to 24 beers per day, sometimes more on weekend. Patient describes depression, which she attributes at least partially to alcohol dependence, and more recently to loss of loved one.   [name] was admitted to the hospital with  a blood alcohol level of 185. She was intoxicated requiring detoxification treatments. She received Librium detox protocols. She was also enrolled in and  participated in the group counseling sessions being offered and held on this unit. She learned coping skills. She was medicated and discharged on acamprosate 666 mg three times daily for alcoholism, Hydroxyzine 25 mg three times daily as needed for anxiety and Sertraline 50 mg daily for depression. [name] was resumed on her pertinent home medications for her other pre-existing medical issues that she presented. She tolerated her treatment regimen without any adverse effects and or reactions.  [name] has completed detox treatment and her mood is stable. This is evidenced by her reports of improved mood and absence of substance withdrawal syndrome. She is currently being discharged to continue treatment at the [facility]. She is provided with all the pertinent information required to make this appointment without problems. Upon discharge, she adamantly denies any SIHI, AVH, delusional thoughts, paranoia and or withdrawal symptoms. She received from the [facility], a 4 days worth, supply samples of her [facility] discharge medications. She left [facility] with all personal belongings in no apparent distress. Transportation per patient's arrangement.   Consults:  psychiatry  Significant Diagnostic Studies:  labs: CBC with diff, CMP, UDS, toxicology tests, U/A  Discharge Vitals:   Blood pressure 128/86, pulse 71, temperature 97.4 F (36.3 C), temperature source Oral, resp. rate 16, height 5\' 1"  (1.549 m), weight 66.225 kg (146 lb), SpO2 97.00%. Body mass index is 27.6 kg/(m^2). Lab Results:   Results for orders placed during the hospital encounter of 03/28/14 (from the past 72 hour(s))  TSH     Status: Abnormal   Collection Time    03/31/14  6:25 AM      Result Value Ref Range   TSH 8.750 (*) 0.350 - 4.500 uIU/mL   Comment: Performed at [facility]    Physical Findings: AIMS: Facial and Oral Movements Muscles of Facial Expression: None, normal Lips and Perioral Area: None, normal Jaw:  None, normal Tongue: None, normal,Extremity Movements Upper (arms, wrists, hands, fingers): None, normal Lower (legs, knees, ankles, toes): None, normal, Trunk Movements Neck, shoulders, hips: None, normal, Overall Severity Severity of abnormal movements (highest score from questions above): None, normal Incapacitation due to abnormal movements: None, normal Patient's awareness of abnormal movements (rate only patient's report): No Awareness, Dental Status Current problems with teeth and/or dentures?: No Does patient usually wear dentures?: No  CIWA:  CIWA-Ar Total: 0 COWS:     Psychiatric Specialty Exam: See Psychiatric Specialty Exam and Suicide Risk Assessment completed by Attending Physician prior to discharge.  Discharge destination:  Home  Is patient on multiple antipsychotic therapies at discharge:  No   Has Patient had three or more failed trials of antipsychotic monotherapy by history:  No  Recommended Plan for Multiple Antipsychotic Therapies: NA    Medication List    STOP taking these medications       amLODipine 10 MG tablet  Commonly known as:  NORVASC     citalopram 20 MG tablet  Commonly known as:  CELEXA      TAKE these medications     Indication   acamprosate 333 MG tablet  Commonly known as:  CAMPRAL  Take 2 tablets (666 mg total) by mouth 3 (three) times daily with meals. For alcoholism   Indication:  Excessive Use of Alcohol     hydrOXYzine 25 MG tablet  Commonly known as:  ATARAX/VISTARIL  Take 1  tablet 925 mg) three times daily as needed: For anxiety   Indication:  Tension, Anxiety     levothyroxine 50 MCG tablet  Commonly known as:  SYNTHROID, LEVOTHROID  Take 1 tablet (50 mcg total) by mouth daily before breakfast. For low thyroid function   Indication:  Underactive Thyroid     sertraline 50 MG tablet  Commonly known as:  ZOLOFT  Take 1 tablet (50 mg total) by mouth daily. For depression   Indication:  Major Depressive Disorder        Follow-up Information   Follow up with [facility] On 05/11/2014. (Appointment for medication management and lab work at 11:45 am. Please call office if you need to reschedule.)    Contact information:   [address] [place], [place] [address] [phone]     Follow-up recommendations:  Activity:  As tolerated Diet: As recommended by your primary care doctor. Keep all scheduled follow-up appointments as recommended.  Comments:  Take all your medications as prescribed by your mental healthcare provider. Report any adverse effects and or reactions from your medicines to your outpatient provider promptly. Patient is instructed and cautioned to not engage in alcohol and or illegal drug use while on prescription medicines. In the event of worsening symptoms, patient is instructed to call the crisis hotline, 911 and or go to the nearest ED for appropriate evaluation and treatment of symptoms. Follow-up with your primary care provider for your other medical issues, concerns and or health care needs.   Total Discharge Time:  Greater than 30 minutes.  Signed: Encarnacion Slates, PMHNP-BC 04/01/2014, 3:17 PM  Patient seen, Suicide Assessment Completed.  Disposition Plan Reviewed

## 2014-04-06 NOTE — Progress Notes (Signed)
Patient Discharge Instructions:  After Visit Summary (AVS):   Faxed to:  04/06/14 Discharge Summary Note:   Faxed to:  04/06/14 Psychiatric Admission Assessment Note:   Faxed to:  04/06/14 Suicide Risk Assessment - Discharge Assessment:   Faxed to:  04/06/14 Faxed/Sent to the Next Level Care provider:  04/06/14 Faxed to Castalia PA @ [facility], 04/06/2014, 2:32 PM

## 2014-07-20 ENCOUNTER — Encounter: Payer: Self-pay | Admitting: Internal Medicine

## 2014-11-20 ENCOUNTER — Inpatient Hospital Stay (HOSPITAL_COMMUNITY)
Admission: EM | Admit: 2014-11-20 | Discharge: 2014-11-22 | DRG: 918 | Disposition: A | Discharge status: Other Acute Inpatient Hospital | Payer: BLUE CROSS/BLUE SHIELD | Attending: Internal Medicine | Admitting: Internal Medicine

## 2014-11-20 ENCOUNTER — Other Ambulatory Visit (HOSPITAL_COMMUNITY): Payer: Self-pay

## 2014-11-20 ENCOUNTER — Encounter (HOSPITAL_COMMUNITY): Payer: Self-pay | Admitting: *Deleted

## 2014-11-20 DIAGNOSIS — F329 Major depressive disorder, single episode, unspecified: Secondary | ICD-10-CM

## 2014-11-20 DIAGNOSIS — T43222A Poisoning by selective serotonin reuptake inhibitors, intentional self-harm, initial encounter: Principal | ICD-10-CM | POA: Diagnosis present

## 2014-11-20 DIAGNOSIS — N179 Acute kidney failure, unspecified: Secondary | ICD-10-CM

## 2014-11-20 DIAGNOSIS — T510X2A Toxic effect of ethanol, intentional self-harm, initial encounter: Secondary | ICD-10-CM | POA: Diagnosis present

## 2014-11-20 DIAGNOSIS — F10929 Alcohol use, unspecified with intoxication, unspecified: Secondary | ICD-10-CM | POA: Diagnosis present

## 2014-11-20 DIAGNOSIS — Z79899 Other long term (current) drug therapy: Secondary | ICD-10-CM

## 2014-11-20 DIAGNOSIS — I10 Essential (primary) hypertension: Secondary | ICD-10-CM | POA: Diagnosis present

## 2014-11-20 DIAGNOSIS — E876 Hypokalemia: Secondary | ICD-10-CM | POA: Diagnosis present

## 2014-11-20 DIAGNOSIS — F17210 Nicotine dependence, cigarettes, uncomplicated: Secondary | ICD-10-CM | POA: Diagnosis present

## 2014-11-20 DIAGNOSIS — Y92009 Unspecified place in unspecified non-institutional (private) residence as the place of occurrence of the external cause: Secondary | ICD-10-CM

## 2014-11-20 DIAGNOSIS — I959 Hypotension, unspecified: Secondary | ICD-10-CM | POA: Diagnosis present

## 2014-11-20 DIAGNOSIS — Z791 Long term (current) use of non-steroidal anti-inflammatories (NSAID): Secondary | ICD-10-CM

## 2014-11-20 DIAGNOSIS — F32A Depression, unspecified: Secondary | ICD-10-CM

## 2014-11-20 DIAGNOSIS — E785 Hyperlipidemia, unspecified: Secondary | ICD-10-CM | POA: Diagnosis present

## 2014-11-20 DIAGNOSIS — F1010 Alcohol abuse, uncomplicated: Secondary | ICD-10-CM | POA: Diagnosis not present

## 2014-11-20 DIAGNOSIS — F419 Anxiety disorder, unspecified: Secondary | ICD-10-CM | POA: Diagnosis present

## 2014-11-20 DIAGNOSIS — T50904A Poisoning by unspecified drugs, medicaments and biological substances, undetermined, initial encounter: Secondary | ICD-10-CM

## 2014-11-20 DIAGNOSIS — Z8781 Personal history of (healed) traumatic fracture: Secondary | ICD-10-CM

## 2014-11-20 DIAGNOSIS — Z915 Personal history of self-harm: Secondary | ICD-10-CM

## 2014-11-20 DIAGNOSIS — F332 Major depressive disorder, recurrent severe without psychotic features: Secondary | ICD-10-CM

## 2014-11-20 DIAGNOSIS — F10229 Alcohol dependence with intoxication, unspecified: Secondary | ICD-10-CM | POA: Diagnosis present

## 2014-11-20 DIAGNOSIS — Z9151 Personal history of suicidal behavior: Secondary | ICD-10-CM

## 2014-11-20 DIAGNOSIS — T50902A Poisoning by unspecified drugs, medicaments and biological substances, intentional self-harm, initial encounter: Secondary | ICD-10-CM | POA: Diagnosis present

## 2014-11-20 DIAGNOSIS — E869 Volume depletion, unspecified: Secondary | ICD-10-CM | POA: Diagnosis present

## 2014-11-20 DIAGNOSIS — E039 Hypothyroidism, unspecified: Secondary | ICD-10-CM | POA: Diagnosis present

## 2014-11-20 LAB — CBC WITH DIFFERENTIAL/PLATELET
Basophils Absolute: 0 10*3/uL (ref 0.0–0.1)
Basophils Relative: 1 % (ref 0–1)
Eosinophils Absolute: 0.1 10*3/uL (ref 0.0–0.7)
Eosinophils Relative: 2 % (ref 0–5)
HEMATOCRIT: 37.9 % (ref 36.0–46.0)
Hemoglobin: 12.7 g/dL (ref 12.0–15.0)
LYMPHS ABS: 2.5 10*3/uL (ref 0.7–4.0)
LYMPHS PCT: 37 % (ref 12–46)
MCH: 30 pg (ref 26.0–34.0)
MCHC: 33.5 g/dL (ref 30.0–36.0)
MCV: 89.4 fL (ref 78.0–100.0)
Monocytes Absolute: 0.4 10*3/uL (ref 0.1–1.0)
Monocytes Relative: 6 % (ref 3–12)
NEUTROS ABS: 3.7 10*3/uL (ref 1.7–7.7)
NEUTROS PCT: 54 % (ref 43–77)
Platelets: 316 10*3/uL (ref 150–400)
RBC: 4.24 MIL/uL (ref 3.87–5.11)
RDW: 12.9 % (ref 11.5–15.5)
WBC: 6.7 10*3/uL (ref 4.0–10.5)

## 2014-11-20 LAB — URINALYSIS, ROUTINE W REFLEX MICROSCOPIC
Bilirubin Urine: NEGATIVE
GLUCOSE, UA: NEGATIVE mg/dL
Ketones, ur: NEGATIVE mg/dL
LEUKOCYTES UA: NEGATIVE
NITRITE: NEGATIVE
PH: 6 (ref 5.0–8.0)
Protein, ur: NEGATIVE mg/dL
SPECIFIC GRAVITY, URINE: 1.004 — AB (ref 1.005–1.030)
Urobilinogen, UA: 0.2 mg/dL (ref 0.0–1.0)

## 2014-11-20 LAB — COMPREHENSIVE METABOLIC PANEL
ALBUMIN: 4.3 g/dL (ref 3.5–5.0)
ALT: 38 U/L (ref 14–54)
AST: 26 U/L (ref 15–41)
Alkaline Phosphatase: 75 U/L (ref 38–126)
Anion gap: 13 (ref 5–15)
BUN: 18 mg/dL (ref 6–20)
CHLORIDE: 109 mmol/L (ref 101–111)
CO2: 20 mmol/L — ABNORMAL LOW (ref 22–32)
Calcium: 8.4 mg/dL — ABNORMAL LOW (ref 8.9–10.3)
Creatinine, Ser: 0.63 mg/dL (ref 0.44–1.00)
GFR calc Af Amer: >60 mL/min (ref >60)
GFR calc non Af Amer: >60 mL/min (ref >60)
Glucose, Bld: 94 mg/dL (ref 65–99)
Potassium: 3.2 mmol/L — ABNORMAL LOW (ref 3.5–5.1)
SODIUM: 142 mmol/L (ref 135–145)
TOTAL PROTEIN: 7.1 g/dL (ref 6.5–8.1)
Total Bilirubin: 0.3 mg/dL (ref 0.3–1.2)

## 2014-11-20 LAB — MAGNESIUM: Magnesium: 2.3 mg/dL (ref 1.7–2.4)

## 2014-11-20 LAB — POC URINE PREG, ED: Preg Test, Ur: NEGATIVE

## 2014-11-20 LAB — URINE MICROSCOPIC-ADD ON

## 2014-11-20 LAB — SALICYLATE LEVEL: Salicylate Lvl: <4 mg/dL (ref 2.8–30.0)

## 2014-11-20 LAB — ETHANOL: Alcohol, Ethyl (B): 162 mg/dL — ABNORMAL HIGH (ref <5)

## 2014-11-20 LAB — ACETAMINOPHEN LEVEL: Acetaminophen (Tylenol), Serum: <10 ug/mL — ABNORMAL LOW (ref 10–30)

## 2014-11-20 MED ORDER — ACTIDOSE WITH SORBITOL 50 GM/240ML PO LIQD
50.0000 g | ORAL | Status: AC
  Administered 2014-11-20: 50 g via ORAL
  Filled 2014-11-20: qty 240

## 2014-11-20 MED ORDER — SODIUM CHLORIDE 0.9 % IV SOLN
1000.0000 mL | INTRAVENOUS | Status: DC
  Administered 2014-11-20: 1000 mL via INTRAVENOUS

## 2014-11-20 MED ORDER — SODIUM CHLORIDE 0.9 % IV SOLN
1000.0000 mL | Freq: Once | INTRAVENOUS | Status: AC
  Administered 2014-11-20: 1000 mL via INTRAVENOUS

## 2014-11-20 NOTE — ED Notes (Signed)
Pt states she has drank 13 beers today and then tonight took 10 20mg  Celexa, states she has been under a lot of stress and wanted it to go away, states "I don't know" when asked if she's SI, when asked if she took pills and didn't care if she woke up pt stated "yes". Pt sleeping at this time.

## 2014-11-20 NOTE — ED Notes (Signed)
Per EMS pt from home, alcoholic, took 10 20mg  Celexa tablets between 1915 and 2015, denies SI/HI, pt states she took them to take the edge off d/t more stress at work. Denies n/v/d, denies pain, feels more tired than normal. Has taken the same amount of Celexa before and treated at ED for it.

## 2014-11-20 NOTE — ED Provider Notes (Signed)
CSN: [id number]     Arrival date & time 11/20/14  2147 History   First MD Initiated Contact with Patient 11/20/14 2149     Chief Complaint  Patient presents with  . Drug Overdose     (Consider location/radiation/quality/duration/timing/severity/associated sxs/prior Treatment) HPI The patient reports that she drank approximately 13 beers and took an estimated 10 Celexa, 20 mg each, between 19:15 and 20:15. The patient reports she was trying to treat anxiety and pain. She states that she has a lot of stress at work and was trying to deal with that stress. The pain she refers to appears to be emotional pain as she is not endorsing any physical pain. She states she did overdose once before in October under similar circumstances. She has not specifically endorsed suicidal intent, however she has not specifically denied it either. She denies other recent illness. She denies that she ingested other medications in conjunction with the Celexa. She denies she sustained any falls or other injuries. Past Medical History  Diagnosis Date  . Hypertension   . Depression   . Suicidal overdose   . Alcoholism    Past Surgical History  Procedure Laterality Date  . Fracture surgery      fractured left ankle 2007  . Ganglion cyst excision      left foot   No family history on file. History  Substance Use Topics  . Smoking status: Current Every Day Smoker -- 1.00 packs/day for 5 years    Types: Cigarettes  . Smokeless tobacco: Never Used  . Alcohol Use: 7.2 oz/week    12 Cans of beer per week     Comment: daily 6-7 cans of beer   OB History    No data available     Review of Systems 10 Systems reviewed and are negative for acute change except as noted in the HPI.    Allergies  Review of patient's allergies indicates no known allergies.  Home Medications   Prior to Admission medications   Medication Sig Start Date End Date Taking? Authorizing Provider  amLODipine (NORVASC) 10 MG tablet  Take 10 mg by mouth daily. 09/08/14  Yes Historical Provider, MD  atorvastatin (LIPITOR) 20 MG tablet Take 1 tablet by mouth daily. 09/12/14  Yes Historical Provider, MD  citalopram (CELEXA) 20 MG tablet Take 1 tablet by mouth daily. 09/12/14  Yes Historical Provider, MD  ibuprofen (ADVIL,MOTRIN) 200 MG tablet Take 400 mg by mouth every 6 (six) hours as needed for moderate pain.   Yes Historical Provider, MD  acamprosate (CAMPRAL) 333 MG tablet Take 2 tablets (666 mg total) by mouth 3 (three) times daily with meals. For alcoholism 04/01/14   Encarnacion Slates, NP  hydrOXYzine (ATARAX/VISTARIL) 25 MG tablet Take 1 tablet 925 mg) three times daily as needed: For anxiety 04/01/14   Encarnacion Slates, NP  levothyroxine (SYNTHROID, LEVOTHROID) 50 MCG tablet Take 1 tablet (50 mcg total) by mouth daily before breakfast. For low thyroid function 04/01/14   Encarnacion Slates, NP  sertraline (ZOLOFT) 50 MG tablet Take 1 tablet (50 mg total) by mouth daily. For depression 04/01/14   Encarnacion Slates, NP   BP 83/44 mmHg  Pulse 73  Temp(Src) 98.5 F (36.9 C) (Oral)  Resp 15  Wt 145 lb (65.772 kg)  SpO2 98% Physical Exam  Constitutional: She is oriented to person, place, and time. She appears well-developed and well-nourished.  Patient is mildly drowsy in appearance. She is appropriate and giving historical information.  No respiratory distress. Color is good.  HENT:  Head: Normocephalic and atraumatic.  Right Ear: External ear normal.  Left Ear: External ear normal.  Mouth/Throat: Oropharynx is clear and moist.  Eyes: EOM are normal. Pupils are equal, round, and reactive to light.  Neck: Neck supple.  Cardiovascular: Normal rate, regular rhythm, normal heart sounds and intact distal pulses.   Pulmonary/Chest: Effort normal and breath sounds normal.  Abdominal: Soft. Bowel sounds are normal. She exhibits no distension. There is no tenderness.  Musculoskeletal: Normal range of motion. She exhibits no edema or tenderness.   Neurological: She is alert and oriented to person, place, and time. She has normal strength. No cranial nerve deficit. She exhibits normal muscle tone. Coordination normal. GCS eye subscore is 4. GCS verbal subscore is 5. GCS motor subscore is 6.  Skin: Skin is warm, dry and intact.  Psychiatric:  Drowsy, slightly withdrawn.    ED Course  Procedures (including critical care time) Labs Review Labs Reviewed  ACETAMINOPHEN LEVEL - Abnormal; Notable for the following:    Acetaminophen (Tylenol), Serum <10 (*)    All other components within normal limits  COMPREHENSIVE METABOLIC PANEL - Abnormal; Notable for the following:    Potassium 3.2 (*)    CO2 20 (*)    Calcium 8.4 (*)    All other components within normal limits  ETHANOL - Abnormal; Notable for the following:    Alcohol, Ethyl (B) 162 (*)    All other components within normal limits  URINALYSIS, ROUTINE W REFLEX MICROSCOPIC - Abnormal; Notable for the following:    Specific Gravity, Urine 1.004 (*)    Hgb urine dipstick TRACE (*)    All other components within normal limits  CBC WITH DIFFERENTIAL/PLATELET  SALICYLATE LEVEL  URINE RAPID DRUG SCREEN (HOSP PERFORMED)  MAGNESIUM  URINE MICROSCOPIC-ADD ON  POC URINE PREG, ED    Imaging Review No results found.   EKG Interpretation None     CRITICAL CARE Performed by: Charlesetta Shanks   Total critical care time: 45  Critical care time was exclusive of separately billable procedures and treating other patients.  Critical care was necessary to treat or prevent imminent or life-threatening deterioration.  Critical care was time spent personally by me on the following activities: development of treatment plan with patient and/or surrogate as well as nursing, discussions with consultants, evaluation of patient's response to treatment, examination of patient, obtaining history from patient or surrogate, ordering and performing treatments and interventions, ordering and  review of laboratory studies, ordering and review of radiographic studies, pulse oximetry and re-evaluation of patient's condition. MDM   Final diagnoses:  Overdose, undetermined intent, initial encounter  Alcohol abuse  Depression   The patient has intentionally overdosed while using alcohol. She will require further observation for Celexa overdose and persistent hypotension. The patient did not specifically endorse suicidal intention however she did not clearly states she was not suicidal. At this time she will be placed in involuntary commitment for having an overdose with substantial risk to herself.    Charlesetta Shanks, MD 11/21/14 [phone]

## 2014-11-20 NOTE — ED Notes (Signed)
Bed: WA03 Expected date:  Expected time:  Means of arrival:  Comments: EMS 70F OD

## 2014-11-20 NOTE — ED Notes (Signed)
Poison Control Recommendations: Monitor for Bradycardia, QTC prolongation, Ventricular Rhythms, & Seizures  Recommend a 6 hour (or until returns to baseline) observation, if patient took >=600mg  Celexa or story is unclear then patient should be admitted for 24 hour observation.   Recommended EKG, monitor for QTC prolongation, telemetry monitoring, check K and Mg levels (replace as needed to high side of normal), check tylenol level with repeat check at 4 hours, hydrate patient, and monitor for seizures.

## 2014-11-21 ENCOUNTER — Other Ambulatory Visit: Payer: Self-pay

## 2014-11-21 DIAGNOSIS — F1994 Other psychoactive substance use, unspecified with psychoactive substance-induced mood disorder: Secondary | ICD-10-CM

## 2014-11-21 DIAGNOSIS — F332 Major depressive disorder, recurrent severe without psychotic features: Secondary | ICD-10-CM | POA: Diagnosis present

## 2014-11-21 DIAGNOSIS — R45851 Suicidal ideations: Secondary | ICD-10-CM | POA: Diagnosis not present

## 2014-11-21 DIAGNOSIS — E785 Hyperlipidemia, unspecified: Secondary | ICD-10-CM | POA: Diagnosis present

## 2014-11-21 DIAGNOSIS — Z9151 Personal history of suicidal behavior: Secondary | ICD-10-CM

## 2014-11-21 DIAGNOSIS — T43222A Poisoning by selective serotonin reuptake inhibitors, intentional self-harm, initial encounter: Principal | ICD-10-CM

## 2014-11-21 DIAGNOSIS — T50904A Poisoning by unspecified drugs, medicaments and biological substances, undetermined, initial encounter: Secondary | ICD-10-CM

## 2014-11-21 DIAGNOSIS — F10929 Alcohol use, unspecified with intoxication, unspecified: Secondary | ICD-10-CM | POA: Diagnosis present

## 2014-11-21 DIAGNOSIS — Y92009 Unspecified place in unspecified non-institutional (private) residence as the place of occurrence of the external cause: Secondary | ICD-10-CM | POA: Diagnosis not present

## 2014-11-21 DIAGNOSIS — E869 Volume depletion, unspecified: Secondary | ICD-10-CM | POA: Diagnosis present

## 2014-11-21 DIAGNOSIS — Z8781 Personal history of (healed) traumatic fracture: Secondary | ICD-10-CM | POA: Diagnosis not present

## 2014-11-21 DIAGNOSIS — T50901A Poisoning by unspecified drugs, medicaments and biological substances, accidental (unintentional), initial encounter: Secondary | ICD-10-CM | POA: Insufficient documentation

## 2014-11-21 DIAGNOSIS — T1491 Suicide attempt: Secondary | ICD-10-CM

## 2014-11-21 DIAGNOSIS — F10120 Alcohol abuse with intoxication, uncomplicated: Secondary | ICD-10-CM

## 2014-11-21 DIAGNOSIS — T50902A Poisoning by unspecified drugs, medicaments and biological substances, intentional self-harm, initial encounter: Secondary | ICD-10-CM | POA: Diagnosis not present

## 2014-11-21 DIAGNOSIS — E876 Hypokalemia: Secondary | ICD-10-CM | POA: Diagnosis present

## 2014-11-21 DIAGNOSIS — F10229 Alcohol dependence with intoxication, unspecified: Secondary | ICD-10-CM | POA: Diagnosis present

## 2014-11-21 DIAGNOSIS — I959 Hypotension, unspecified: Secondary | ICD-10-CM | POA: Diagnosis present

## 2014-11-21 DIAGNOSIS — F419 Anxiety disorder, unspecified: Secondary | ICD-10-CM | POA: Diagnosis present

## 2014-11-21 DIAGNOSIS — F17210 Nicotine dependence, cigarettes, uncomplicated: Secondary | ICD-10-CM | POA: Diagnosis present

## 2014-11-21 DIAGNOSIS — T50902S Poisoning by unspecified drugs, medicaments and biological substances, intentional self-harm, sequela: Secondary | ICD-10-CM | POA: Diagnosis not present

## 2014-11-21 DIAGNOSIS — I10 Essential (primary) hypertension: Secondary | ICD-10-CM | POA: Diagnosis present

## 2014-11-21 DIAGNOSIS — Z9189 Other specified personal risk factors, not elsewhere classified: Secondary | ICD-10-CM

## 2014-11-21 DIAGNOSIS — T510X2A Toxic effect of ethanol, intentional self-harm, initial encounter: Secondary | ICD-10-CM | POA: Diagnosis present

## 2014-11-21 DIAGNOSIS — Z79899 Other long term (current) drug therapy: Secondary | ICD-10-CM | POA: Diagnosis not present

## 2014-11-21 DIAGNOSIS — F1010 Alcohol abuse, uncomplicated: Secondary | ICD-10-CM | POA: Diagnosis present

## 2014-11-21 DIAGNOSIS — E039 Hypothyroidism, unspecified: Secondary | ICD-10-CM | POA: Diagnosis present

## 2014-11-21 DIAGNOSIS — Z915 Personal history of self-harm: Secondary | ICD-10-CM

## 2014-11-21 DIAGNOSIS — Z791 Long term (current) use of non-steroidal anti-inflammatories (NSAID): Secondary | ICD-10-CM | POA: Diagnosis not present

## 2014-11-21 DIAGNOSIS — F10230 Alcohol dependence with withdrawal, uncomplicated: Secondary | ICD-10-CM | POA: Diagnosis not present

## 2014-11-21 LAB — MAGNESIUM: Magnesium: 1.9 mg/dL (ref 1.7–2.4)

## 2014-11-21 LAB — CBC
HCT: 39.6 % (ref 36.0–46.0)
HEMOGLOBIN: 13.1 g/dL (ref 12.0–15.0)
MCH: 29.8 pg (ref 26.0–34.0)
MCHC: 33.1 g/dL (ref 30.0–36.0)
MCV: 90.2 fL (ref 78.0–100.0)
PLATELETS: 304 10*3/uL (ref 150–400)
RBC: 4.39 MIL/uL (ref 3.87–5.11)
RDW: 13 % (ref 11.5–15.5)
WBC: 4.7 10*3/uL (ref 4.0–10.5)

## 2014-11-21 LAB — TSH: TSH: 3.53 u[IU]/mL (ref 0.350–4.500)

## 2014-11-21 LAB — BASIC METABOLIC PANEL
Anion gap: 12 (ref 5–15)
BUN: 9 mg/dL (ref 6–20)
CO2: 20 mmol/L — ABNORMAL LOW (ref 22–32)
Calcium: 7.3 mg/dL — ABNORMAL LOW (ref 8.9–10.3)
Chloride: 110 mmol/L (ref 101–111)
Creatinine, Ser: 0.43 mg/dL — ABNORMAL LOW (ref 0.44–1.00)
GFR calc Af Amer: >60 mL/min (ref >60)
GFR calc non Af Amer: >60 mL/min (ref >60)
GLUCOSE: 130 mg/dL — AB (ref 65–99)
Potassium: 3.1 mmol/L — ABNORMAL LOW (ref 3.5–5.1)
Sodium: 142 mmol/L (ref 135–145)

## 2014-11-21 LAB — RAPID URINE DRUG SCREEN, HOSP PERFORMED
Amphetamines: NOT DETECTED
Barbiturates: NOT DETECTED
Benzodiazepines: NOT DETECTED
Cocaine: NOT DETECTED
Opiates: NOT DETECTED
Tetrahydrocannabinol: NOT DETECTED

## 2014-11-21 LAB — MRSA PCR SCREENING: MRSA by PCR: NEGATIVE

## 2014-11-21 MED ORDER — THIAMINE HCL 100 MG/ML IJ SOLN
100.0000 mg | Freq: Every day | INTRAMUSCULAR | Status: DC
  Filled 2014-11-21: qty 1

## 2014-11-21 MED ORDER — LORAZEPAM 2 MG/ML IJ SOLN: 0.0000 mg | Freq: Four times a day (QID) | INTRAMUSCULAR | Status: DC

## 2014-11-21 MED ORDER — LORAZEPAM 2 MG/ML IJ SOLN: 0.0000 mg | Freq: Two times a day (BID) | INTRAMUSCULAR | Status: DC

## 2014-11-21 MED ORDER — ENOXAPARIN SODIUM 40 MG/0.4ML ~~LOC~~ SOLN
40.0000 mg | SUBCUTANEOUS | Status: DC
  Administered 2014-11-21: 40 mg via SUBCUTANEOUS
  Filled 2014-11-21 (×2): qty 0.4

## 2014-11-21 MED ORDER — VITAMIN B-1 100 MG PO TABS
100.0000 mg | ORAL_TABLET | Freq: Every day | ORAL | Status: DC
  Administered 2014-11-21 – 2014-11-22 (×2): 100 mg via ORAL
  Filled 2014-11-21 (×2): qty 1

## 2014-11-21 MED ORDER — LORAZEPAM 1 MG PO TABS: 1.0000 mg | ORAL_TABLET | Freq: Four times a day (QID) | ORAL | Status: DC | PRN

## 2014-11-21 MED ORDER — MAGNESIUM SULFATE 2 GM/50ML IV SOLN
2.0000 g | Freq: Once | INTRAVENOUS | Status: AC
  Administered 2014-11-21: 2 g via INTRAVENOUS
  Filled 2014-11-21: qty 50

## 2014-11-21 MED ORDER — SODIUM CHLORIDE 0.9 % IV SOLN
1000.0000 mL | Freq: Once | INTRAVENOUS | Status: AC
  Administered 2014-11-21: 1000 mL via INTRAVENOUS

## 2014-11-21 MED ORDER — LEVOTHYROXINE SODIUM 50 MCG PO TABS
50.0000 ug | ORAL_TABLET | Freq: Every day | ORAL | Status: DC
  Filled 2014-11-21 (×2): qty 1

## 2014-11-21 MED ORDER — POTASSIUM CHLORIDE CRYS ER 20 MEQ PO TBCR
40.0000 meq | EXTENDED_RELEASE_TABLET | Freq: Every day | ORAL | Status: DC
  Administered 2014-11-21 – 2014-11-22 (×2): 40 meq via ORAL
  Filled 2014-11-21 (×2): qty 2

## 2014-11-21 MED ORDER — ADULT MULTIVITAMIN W/MINERALS CH
1.0000 | ORAL_TABLET | Freq: Every day | ORAL | Status: DC
  Administered 2014-11-21 – 2014-11-22 (×2): 1 via ORAL
  Filled 2014-11-21 (×2): qty 1

## 2014-11-21 MED ORDER — ONDANSETRON HCL 4 MG PO TABS: 4.0000 mg | ORAL_TABLET | Freq: Four times a day (QID) | ORAL | Status: DC | PRN

## 2014-11-21 MED ORDER — LORAZEPAM 2 MG/ML IJ SOLN: 1.0000 mg | Freq: Four times a day (QID) | INTRAMUSCULAR | Status: DC | PRN

## 2014-11-21 MED ORDER — POTASSIUM CHLORIDE IN NACL 20-0.9 MEQ/L-% IV SOLN
INTRAVENOUS | Status: DC
  Administered 2014-11-21: 04:00:00 via INTRAVENOUS
  Filled 2014-11-21: qty 1000

## 2014-11-21 MED ORDER — FOLIC ACID 1 MG PO TABS
1.0000 mg | ORAL_TABLET | Freq: Every day | ORAL | Status: DC
  Administered 2014-11-21 – 2014-11-22 (×2): 1 mg via ORAL
  Filled 2014-11-21 (×2): qty 1

## 2014-11-21 MED ORDER — ONDANSETRON HCL 4 MG/2ML IJ SOLN: 4.0000 mg | Freq: Four times a day (QID) | INTRAMUSCULAR | Status: DC | PRN

## 2014-11-21 MED ORDER — SODIUM CHLORIDE 0.9 % IV BOLUS (SEPSIS)
1000.0000 mL | INTRAVENOUS | Status: AC
  Administered 2014-11-21: 1000 mL via INTRAVENOUS

## 2014-11-21 MED ORDER — SODIUM CHLORIDE 0.9 % IV SOLN: INTRAVENOUS | Status: DC

## 2014-11-21 NOTE — Progress Notes (Signed)
Date:  Nov 21, 2014 U.R. performed for needs and level of care. Will continue to follow for Case Management needs.  Rhonda Davis, RN, BSN, CCM   [phone] 

## 2014-11-21 NOTE — H&P (Signed)
PCP:   Vidal Schwalbe, MD   Chief Complaint:  overdose  HPI: 59 yo female h/o suicide attempt oct 15 with celexa, etoh abuse, htn, depression comes in after drinking a 12 pack of beer and taking over ten of her celexa pills.  Pt says she just has been feeling down and was stressed out and wanted to feel better.  No recent illnesses.  No fevers.  Pt called 911 herself after ingesting the celexa.  She received charcoal in the ED.  Poison control was called recommended monitoring her qtc interval and watching for seizures.  Review of Systems:  Positive and negative as per HPI otherwise all other systems are negative  Past Medical History: Past Medical History  Diagnosis Date  . Hypertension   . Depression   . Suicidal overdose   . Alcoholism    Past Surgical History  Procedure Laterality Date  . Fracture surgery      fractured left ankle 2007  . Ganglion cyst excision      left foot    Medications: Prior to Admission medications   Medication Sig Start Date End Date Taking? Authorizing Provider  amLODipine (NORVASC) 10 MG tablet Take 10 mg by mouth daily. 09/08/14  Yes Historical Provider, MD  atorvastatin (LIPITOR) 20 MG tablet Take 1 tablet by mouth daily. 09/12/14  Yes Historical Provider, MD  citalopram (CELEXA) 20 MG tablet Take 1 tablet by mouth daily. 09/12/14  Yes Historical Provider, MD  ibuprofen (ADVIL,MOTRIN) 200 MG tablet Take 400 mg by mouth every 6 (six) hours as needed for moderate pain.   Yes Historical Provider, MD  acamprosate (CAMPRAL) 333 MG tablet Take 2 tablets (666 mg total) by mouth 3 (three) times daily with meals. For alcoholism 04/01/14   Encarnacion Slates, NP  hydrOXYzine (ATARAX/VISTARIL) 25 MG tablet Take 1 tablet 925 mg) three times daily as needed: For anxiety 04/01/14   Encarnacion Slates, NP  levothyroxine (SYNTHROID, LEVOTHROID) 50 MCG tablet Take 1 tablet (50 mcg total) by mouth daily before breakfast. For low thyroid function 04/01/14   Encarnacion Slates, NP   sertraline (ZOLOFT) 50 MG tablet Take 1 tablet (50 mg total) by mouth daily. For depression 04/01/14   Encarnacion Slates, NP    Allergies:  No Known Allergies  Social History:  reports that she has been smoking Cigarettes.  She has a 5 pack-year smoking history. She has never used smokeless tobacco. She reports that she drinks about 7.2 oz of alcohol per week. She reports that she does not use illicit drugs.  Family History: No psych issues  Physical Exam: Filed Vitals:   11/20/14 2200 11/20/14 2259 11/20/14 2330 11/21/14 0000  BP: 85/56 87/56 90/59  83/44  Pulse: 79 75 72 73  Temp:      TempSrc:      Resp: 23 18 16 15   Weight:      SpO2: 95% 96% 98% 98%   General appearance: alert, cooperative and no distress depressed Head: Normocephalic, without obvious abnormality, atraumatic Eyes: negative Nose: Nares normal. Septum midline. Mucosa normal. No drainage or sinus tenderness. Neck: no JVD and supple, symmetrical, trachea midline Lungs: clear to auscultation bilaterally Heart: regular rate and rhythm, S1, S2 normal, no murmur, click, rub or gallop Abdomen: soft, non-tender; bowel sounds normal; no masses,  no organomegaly Extremities: extremities normal, atraumatic, no cyanosis or edema Pulses: 2+ and symmetric Skin: Skin color, texture, turgor normal. No rashes or lesions Neurologic: Grossly normal   Labs on Admission:  Recent Labs  11/20/14 2227  NA 142  K 3.2*  CL 109  CO2 20*  GLUCOSE 94  BUN 18  CREATININE 0.63  CALCIUM 8.4*  MG 2.3    Recent Labs  11/20/14 2227  AST 26  ALT 38  ALKPHOS 75  BILITOT 0.3  PROT 7.1  ALBUMIN 4.3    Recent Labs  11/20/14 2227  WBC 6.7  NEUTROABS 3.7  HGB 12.7  HCT 37.9  MCV 89.4  PLT 316    Radiological Exams on Admission: No results found.  Chart reviewed  Assessment/Plan  59 yo female with intentional drug overdose celexa and alcohol abuse  Principal Problem:   Drug overdose, intentional with celexa-   Pt denies taking any bp medications.  Her sbp is soft consistently in the mid 80s.  Bolus 3rd liter of ivf ns ordered now by me with ER nurse.  Place in stepdown unit tonight on suicide and seizure precautions.  Place on ciwa protocol place on iv ativan and bananna bag.  IVC paperwork done by ER dr Colvin Caroli.  Will need psych evaluation once medically cleared particularly with this being second suicide attempt in a year.  Monitor closely for any cardiac arrythmias and serial 12 lead ekgs overnight particularly monitoring qtc interval.     Active Problems:   Alcohol abuse-  As above, ciwa, iv ativan.   Hypothyroidism- noted   Depression-  noted   H/O suicide attempt   Alcohol intoxication-  noted   Hypotension-  As above  Observe on stepdown.  IVC.  FULL CODE.  Prescilla Monger A 11/21/2014, 12:27 AM

## 2014-11-21 NOTE — ED Notes (Addendum)
Hospitalist at bedside. Pt hypotensive 88/55./ Verbal order for NS Bolus obtained. Pt placed in trendelenburg. Pt alert and oriented.

## 2014-11-21 NOTE — ED Notes (Signed)
Attempted to call report x 2, Charge nurse currently in rapid response to call back.

## 2014-11-21 NOTE — Consult Note (Signed)
[facility] Face-to-Face Psychiatry Consult   Reason for Consult:  Alcohol intoxication, depression and overdose Referring Physician:  Dr. Ghimire Patient Identification: [name] MRN:  [id number] Principal Diagnosis: Drug overdose, intentional, substance induced mood disorder Diagnosis:   Patient Active Problem List   Diagnosis Date Noted  . H/O suicide attempt [Z91.89] 11/21/2014  . Drug overdose, intentional [T50.902A] 11/21/2014  . Alcohol intoxication [F10.129] 11/21/2014  . Hypotension [I95.9] 11/21/2014  . Overdose [T50.901A]   . Hypothyroidism [E03.9] 03/31/2014  . Depression [F32.9] 03/31/2014  . Alcohol abuse [F10.10] 12/07/2013  . Alcohol dependence [F10.20] 12/09/2011  . Substance induced mood disorder [F19.94] 12/09/2011    Total Time spent with patient: 1 hour  Subjective:   [name] is a 58 y.o. female patient admitted with alcohol intoxication, depression and overdose.  HPI:  [name] is a 58 years old female admitted to [facility] intensive care unit for alcohol intoxication, status post intentional overdose of antidepressant medication Celexa. Patient endorses symptoms of depression, stress from work, tearful, disturbance of sleep and appetite and increased alcohol intake over several months. Patient had has at least 2 previous acute psychiatric hospitalization for depression and alcohol abuse versus dependence. Patient stated she cannot make up our mind if she can voluntarily admit herself to the [facility] because she is concerned about losing her job and at the same time reportedly extremely stressed about her job. Patient stated she is going to talk to her family members and mother regarding possible voluntary psychiatric hospitalization. Patient minimizes her intention overdose of Celexa as a suicide attempt but at the same time she stated that she does not care if she woke up after taking the pills while intoxicated. Patient stated that she  called herself for help after taking the medication and received charcoal. Patient blood alcohol level is 162 and her urine drug screen is negative for drug of abuse. Patient liver function tests are within normal limits. Patient denies previous history of alcohol detox treatment and rehabilitation treatment.   Medical history: 58 yo female h/o suicide attempt oct 15 with celexa, etoh abuse, htn, depression comes in after drinking a 12 pack of beer and taking over ten of her celexa pills. Pt says she just has been feeling down and was stressed out and wanted to feel better. No recent illnesses. No fevers. Pt called 911 herself after ingesting the celexa. She received charcoal in the ED. Poison control was called recommended monitoring her qtc interval and watching for seizures.  Review of Systems: Positive and negative as per HPI otherwise all other systems are negative  HPI Elements:   Location:  Depression and alcohol abuse. Quality:  Unable to care for herself. Severity:  Status post suicidal attempt. Timing:  Increased stress at work. Duration:  Few weeks to few months. Context:  Increased psychosocial stresses.  Past Medical History:  Past Medical History  Diagnosis Date  . Hypertension   . Depression   . Suicidal overdose   . Alcoholism     Past Surgical History  Procedure Laterality Date  . Fracture surgery      fractured left ankle 2007  . Ganglion cyst excision      left foot   Family History: No family history on file. Social History:  History  Alcohol Use  . 7.2 oz/week  . 12 Cans of beer per week    Comment: daily 6-7 cans of beer     History  Drug Use No      History   Social History  . Marital Status: Single    Spouse Name: N/A  . Number of Children: N/A  . Years of Education: N/A   Social History Main Topics  . Smoking status: Current Every Day Smoker -- 1.00 packs/day for 5 years    Types: Cigarettes  . Smokeless tobacco: Never Used  . Alcohol  Use: 7.2 oz/week    12 Cans of beer per week     Comment: daily 6-7 cans of beer  . Drug Use: No  . Sexual Activity: Yes    Birth Control/ Protection: None   Other Topics Concern  . None   Social History Narrative   Additional Social History:                          Allergies:  No Known Allergies  Labs:  Results for orders placed or performed during the hospital encounter of 11/20/14 (from the past 48 hour(s))  Acetaminophen level     Status: Abnormal   Collection Time: 11/20/14 10:27 PM  Result Value Ref Range   Acetaminophen (Tylenol), Serum <10 (L) 10 - 30 ug/mL    Comment:        THERAPEUTIC CONCENTRATIONS VARY SIGNIFICANTLY. A RANGE OF 10-30 ug/mL MAY BE AN EFFECTIVE CONCENTRATION FOR MANY PATIENTS. HOWEVER, SOME ARE BEST TREATED AT CONCENTRATIONS OUTSIDE THIS RANGE. ACETAMINOPHEN CONCENTRATIONS >150 ug/mL AT 4 HOURS AFTER INGESTION AND >50 ug/mL AT 12 HOURS AFTER INGESTION ARE OFTEN ASSOCIATED WITH TOXIC REACTIONS.   Comprehensive metabolic panel     Status: Abnormal   Collection Time: 11/20/14 10:27 PM  Result Value Ref Range   Sodium 142 135 - 145 mmol/L   Potassium 3.2 (L) 3.5 - 5.1 mmol/L   Chloride 109 101 - 111 mmol/L   CO2 20 (L) 22 - 32 mmol/L   Glucose, Bld 94 65 - 99 mg/dL   BUN 18 6 - 20 mg/dL   Creatinine, Ser 0.63 0.44 - 1.00 mg/dL   Calcium 8.4 (L) 8.9 - 10.3 mg/dL   Total Protein 7.1 6.5 - 8.1 g/dL   Albumin 4.3 3.5 - 5.0 g/dL   AST 26 15 - 41 U/L   ALT 38 14 - 54 U/L   Alkaline Phosphatase 75 38 - 126 U/L   Total Bilirubin 0.3 0.3 - 1.2 mg/dL   GFR calc non Af Amer >60 >60 mL/min   GFR calc Af Amer >60 >60 mL/min    Comment: (NOTE) The eGFR has been calculated using the CKD EPI equation. This calculation has not been validated in all clinical situations. eGFR's persistently <60 mL/min signify possible Chronic Kidney Disease.    Anion gap 13 5 - 15  CBC WITH DIFFERENTIAL     Status: None   Collection Time: 11/20/14 10:27  PM  Result Value Ref Range   WBC 6.7 4.0 - 10.5 K/uL   RBC 4.24 3.87 - 5.11 MIL/uL   Hemoglobin 12.7 12.0 - 15.0 g/dL   HCT 37.9 36.0 - 46.0 %   MCV 89.4 78.0 - 100.0 fL   MCH 30.0 26.0 - 34.0 pg   MCHC 33.5 30.0 - 36.0 g/dL   RDW 12.9 11.5 - 15.5 %   Platelets 316 150 - 400 K/uL   Neutrophils Relative % 54 43 - 77 %   Neutro Abs 3.7 1.7 - 7.7 K/uL   Lymphocytes Relative 37 12 - 46 %   Lymphs Abs 2.5 0.7 - 4.0 K/uL  Monocytes Relative 6 3 - 12 %   Monocytes Absolute 0.4 0.1 - 1.0 K/uL   Eosinophils Relative 2 0 - 5 %   Eosinophils Absolute 0.1 0.0 - 0.7 K/uL   Basophils Relative 1 0 - 1 %   Basophils Absolute 0.0 0.0 - 0.1 K/uL  Ethanol     Status: Abnormal   Collection Time: 11/20/14 10:27 PM  Result Value Ref Range   Alcohol, Ethyl (B) 162 (H) <5 mg/dL    Comment:        LOWEST DETECTABLE LIMIT FOR SERUM ALCOHOL IS 11 mg/dL FOR MEDICAL PURPOSES ONLY   Salicylate level     Status: None   Collection Time: 11/20/14 10:27 PM  Result Value Ref Range   Salicylate Lvl <4.0 2.8 - 30.0 mg/dL  Magnesium     Status: None   Collection Time: 11/20/14 10:27 PM  Result Value Ref Range   Magnesium 2.3 1.7 - 2.4 mg/dL  Drug screen panel, emergency     Status: None   Collection Time: 11/20/14 10:54 PM  Result Value Ref Range   Opiates NONE DETECTED NONE DETECTED   Cocaine NONE DETECTED NONE DETECTED   Benzodiazepines NONE DETECTED NONE DETECTED   Amphetamines NONE DETECTED NONE DETECTED   Tetrahydrocannabinol NONE DETECTED NONE DETECTED   Barbiturates NONE DETECTED NONE DETECTED    Comment:        DRUG SCREEN FOR MEDICAL PURPOSES ONLY.  IF CONFIRMATION IS NEEDED FOR ANY PURPOSE, NOTIFY LAB WITHIN 5 DAYS.        LOWEST DETECTABLE LIMITS FOR URINE DRUG SCREEN Drug Class       Cutoff (ng/mL) Amphetamine      1000 Barbiturate      200 Benzodiazepine   200 Tricyclics       300 Opiates          300 Cocaine          300 THC              50   Urinalysis, Routine w reflex  microscopic     Status: Abnormal   Collection Time: 11/20/14 10:54 PM  Result Value Ref Range   Color, Urine YELLOW YELLOW   APPearance CLEAR CLEAR   Specific Gravity, Urine 1.004 (L) 1.005 - 1.030   pH 6.0 5.0 - 8.0   Glucose, UA NEGATIVE NEGATIVE mg/dL   Hgb urine dipstick TRACE (A) NEGATIVE   Bilirubin Urine NEGATIVE NEGATIVE   Ketones, ur NEGATIVE NEGATIVE mg/dL   Protein, ur NEGATIVE NEGATIVE mg/dL   Urobilinogen, UA 0.2 0.0 - 1.0 mg/dL   Nitrite NEGATIVE NEGATIVE   Leukocytes, UA NEGATIVE NEGATIVE  Urine microscopic-add on     Status: None   Collection Time: 11/20/14 10:54 PM  Result Value Ref Range   Squamous Epithelial / LPF RARE RARE  POC Urine Pregnancy, ED  (not at [facility])     Status: None   Collection Time: 11/20/14 10:57 PM  Result Value Ref Range   Preg Test, Ur NEGATIVE NEGATIVE    Comment:        THE SENSITIVITY OF THIS METHODOLOGY IS >24 mIU/mL   MRSA PCR Screening     Status: None   Collection Time: 11/21/14  3:29 AM  Result Value Ref Range   MRSA by PCR NEGATIVE NEGATIVE    Comment:        The GeneXpert MRSA Assay (FDA approved for NASAL specimens only), is one component of a comprehensive MRSA colonization surveillance program.   It is not intended to diagnose MRSA infection nor to guide or monitor treatment for MRSA infections.   Basic metabolic panel     Status: Abnormal   Collection Time: 11/21/14  3:47 AM  Result Value Ref Range   Sodium 142 135 - 145 mmol/L   Potassium 3.1 (L) 3.5 - 5.1 mmol/L   Chloride 110 101 - 111 mmol/L   CO2 20 (L) 22 - 32 mmol/L   Glucose, Bld 130 (H) 65 - 99 mg/dL   BUN 9 6 - 20 mg/dL   Creatinine, Ser 0.43 (L) 0.44 - 1.00 mg/dL   Calcium 7.3 (L) 8.9 - 10.3 mg/dL   GFR calc non Af Amer >60 >60 mL/min   GFR calc Af Amer >60 >60 mL/min    Comment: (NOTE) The eGFR has been calculated using the CKD EPI equation. This calculation has not been validated in all clinical situations. eGFR's persistently <60 mL/min signify  possible Chronic Kidney Disease.    Anion gap 12 5 - 15  CBC     Status: None   Collection Time: 11/21/14  3:47 AM  Result Value Ref Range   WBC 4.7 4.0 - 10.5 K/uL   RBC 4.39 3.87 - 5.11 MIL/uL   Hemoglobin 13.1 12.0 - 15.0 g/dL   HCT 39.6 36.0 - 46.0 %   MCV 90.2 78.0 - 100.0 fL   MCH 29.8 26.0 - 34.0 pg   MCHC 33.1 30.0 - 36.0 g/dL   RDW 13.0 11.5 - 15.5 %   Platelets 304 150 - 400 K/uL  Magnesium     Status: None   Collection Time: 11/21/14  7:37 AM  Result Value Ref Range   Magnesium 1.9 1.7 - 2.4 mg/dL    Vitals: Blood pressure 113/80, pulse 83, temperature 98.1 F (36.7 C), temperature source Oral, resp. rate 16, height 5' 1" (1.549 m), weight 70.1 kg (154 lb 8.7 oz), SpO2 96 %.  Risk to Self: Is patient at risk for suicide?: Yes Risk to Others:   Prior Inpatient Therapy:   Prior Outpatient Therapy:    Current Facility-Administered Medications  Medication Dose Route Frequency Provider Last Rate Last Dose  . 0.9 % NaCl with KCl 20 mEq/ L  infusion   Intravenous Continuous Phillips Grout, MD 100 mL/hr at 11/21/14 0400    . folic acid (FOLVITE) tablet 1 mg  1 mg Oral Daily Phillips Grout, MD      . LORazepam (ATIVAN) injection 0-4 mg  0-4 mg Intravenous Q6H Phillips Grout, MD   0 mg at 11/21/14 0400   Followed by  . [START ON 11/23/2014] LORazepam (ATIVAN) injection 0-4 mg  0-4 mg Intravenous Q12H Phillips Grout, MD      . LORazepam (ATIVAN) tablet 1 mg  1 mg Oral Q6H PRN Phillips Grout, MD       Or  . LORazepam (ATIVAN) injection 1 mg  1 mg Intravenous Q6H PRN Phillips Grout, MD      . multivitamin with minerals tablet 1 tablet  1 tablet Oral Daily Phillips Grout, MD      . ondansetron [facility]) tablet 4 mg  4 mg Oral Q6H PRN Phillips Grout, MD       Or  . ondansetron (ZOFRAN) injection 4 mg  4 mg Intravenous Q6H PRN Phillips Grout, MD      . potassium chloride SA (K-DUR,KLOR-CON) CR tablet 40 mEq  40 mEq Oral Daily Shanker Kristeen Mans, MD      .  thiamine (VITAMIN B-1)  tablet 100 mg  100 mg Oral Daily Phillips Grout, MD       Or  . thiamine (B-1) injection 100 mg  100 mg Intravenous Daily Phillips Grout, MD        Musculoskeletal: Strength & Muscle Tone: decreased Gait & Station: unable to stand Patient leans: N/A  Psychiatric Specialty Exam: Physical Exam  ROS  Blood pressure 113/80, pulse 83, temperature 98.1 F (36.7 C), temperature source Oral, resp. rate 16, height 5' 1" (1.549 m), weight 70.1 kg (154 lb 8.7 oz), SpO2 96 %.Body mass index is 29.22 kg/(m^2).  General Appearance: Disheveled and Guarded  Eye Contact::  Good  Speech:  Slow  Volume:  Decreased  Mood:  Anxious, Depressed, Dysphoric, Hopeless and Worthless  Affect:  Constricted and Depressed  Thought Process:  Coherent and Goal Directed  Orientation:  Full (Time, Place, and Person)  Thought Content:  Rumination  Suicidal Thoughts:  Yes.  with intent/plan  Homicidal Thoughts:  No  Memory:  Immediate;   Fair Recent;   Fair  Judgement:  Impaired  Insight:  Shallow  Psychomotor Activity:  Decreased and Restlessness  Concentration:  Fair  Recall:  Good  Fund of Knowledge:Good  Language: Good  Akathisia:  Negative  Handed:  Right  AIMS (if indicated):     Assets:  Communication Skills Desire for Improvement Financial Resources/Insurance Housing Leisure Time Physical Health Resilience Social Support Talents/Skills Transportation Vocational/Educational  ADL's:  Impaired  Cognition: WNL  Sleep:      Medical Decision Making: New problem, with additional work up planned, Review of Psycho-Social Stressors (1), Review or order clinical lab tests (1), Discuss test with performing physician (1), Established Problem, Worsening (2), Review or order medicine tests (1), Review of Medication Regimen & Side Effects (2) and Review of New Medication or Change in Dosage (2)  Treatment Plan Summary: Patient presented with increased symptoms of depression, anxiety, alcohol abuse versus  dependence and is status post intentional overdose of antidepressant medication with intention to end her life. Patient has a limited psychosocial support. Daily contact with patient to assess and evaluate symptoms and progress in treatment and Medication management  Plan: Suicidal attempt: Patient need for safety sitter  Depression: Patient needed appropriate medication management but currently hold her antidepressant medication secondary to overdose and monitor for the serotonin syndrome  Alcohol abuse versus dependence: Ativan detox protocol and CIWA monitoring Thiamine 100 mg daily has a supportive treatment  Recommend psychiatric Inpatient admission when medically cleared. Supportive therapy provided about ongoing stressors.   Patient has been receiving medication for depression from primary care physician. Patient needed psychiatrist for medication management and counselor for substance abuse treatment  Appreciate psychiatric consultation and follow up as clinically required Please contact [phone] or [phone] if needs further assistance  Disposition: Patient meets criteria for acute psychiatric hospitalization and hoping she will sign in voluntarily after consulting her family members.   Nabilah Davoli,JANARDHAHA R. 11/21/2014 9:56 AM

## 2014-11-21 NOTE — Progress Notes (Signed)
Pt arrived to unit with Suicide Sitter.  Denies pain.  Room air.  IV intact and SL.  Tearful.  Respirations even and unlabored.  No complaints.

## 2014-11-21 NOTE — Progress Notes (Signed)
PATIENT DETAILS Name: [name] Age: 59 y.o. Sex: female Date of Birth: 1955/07/15 Admit Date: 11/20/2014 Admitting Physician Phillips Grout, MD KLK:[email or web address],[email or web address] S, MD  Subjective: Looks depressed-admitted with suicidal attempt-took 10 tablets of Celexa along with beer.  Assessment/Plan: Principal Problem:   Drug overdose, intentional: Suicidal attempt with alcohol and 10 tablets of Celexa. QTC has normalized. Psych consulted, maintain sitter. Transfer to telemetry.  Active Problems:   Alcohol abuse: Last drink yesterday. No signs of withdrawal. Continue with Ativan per protocol.    Transient hypotension: Likely secondary to volume depletion. Resolved with IV fluids. Stop IV fluids    Hypothyroidism: Check TSH. Continue levothyroxine    Dyslipidemia: Hold statins for now.    Depression: Await psych eval. Celexa on hold.    Hypokalemia: Replete and recheck in a.m.    Hypertension: Had transient hypotension on admission-continue to hold amlodipine. Resume when able  Disposition: Remain inpatient-transfer to telemetry. Suspect will need inpatient psychiatric admission.  Antimicrobial agents  See below  Anti-infectives    None      DVT Prophylaxis: Prophylactic Lovenox   Code Status: Full code   Family Communication None at bedside  Procedures: None  CONSULTS:  Psych  Time spent 30 minutes-Greater than 50% of this time was spent in counseling, explanation of diagnosis, planning of further management, and coordination of care.  MEDICATIONS: Scheduled Meds: . folic acid  1 mg Oral Daily  . LORazepam  0-4 mg Intravenous Q6H   Followed by  . [START ON 11/23/2014] LORazepam  0-4 mg Intravenous Q12H  . magnesium sulfate 1 - 4 g bolus IVPB  2 g Intravenous Once  . multivitamin with minerals  1 tablet Oral Daily  . potassium chloride  40 mEq Oral Daily  . thiamine  100 mg Oral Daily   Or  . thiamine  100 mg Intravenous Daily    Continuous Infusions:  PRN Meds:.LORazepam **OR** LORazepam, ondansetron **OR** ondansetron (ZOFRAN) IV    PHYSICAL EXAM: Vital signs in last 24 hours: Filed Vitals:   11/21/14 0600 11/21/14 0700 11/21/14 0750 11/21/14 0800  BP: 105/68 115/72  113/80  Pulse: 82 79  83  Temp:   98.1 F (36.7 C)   TempSrc:      Resp: 15 13  16   Height:      Weight:      SpO2: 95% 96%  96%    Weight change:  Filed Weights   11/20/14 2154 11/21/14 0330  Weight: 65.772 kg (145 lb) 70.1 kg (154 lb 8.7 oz)   Body mass index is 29.22 kg/(m^2).   Gen Exam: Awake and alert with clear speech.   Neck: Supple, No JVD.   Chest: B/L Clear.   CVS: S1 S2 Regular, no murmurs.  Abdomen: soft, BS +, non tender, non distended.  Extremities: no edema, lower extremities warm to touch. Neurologic: Non Focal.   Skin: No Rash.   Wounds: N/A.  Intake/Output from previous day:  Intake/Output Summary (Last 24 hours) at 11/21/14 1152 Last data filed at 11/21/14 1006  Gross per 24 hour  Intake    630 ml  Output   3100 ml  Net  -2470 ml     LAB RESULTS: CBC  Recent Labs Lab 11/20/14 2227 11/21/14 0347  WBC 6.7 4.7  HGB 12.7 13.1  HCT 37.9 39.6  PLT 316 304  MCV 89.4 90.2  MCH 30.0 29.8  MCHC 33.5 33.1  RDW 12.9 13.0  LYMPHSABS 2.5  --   MONOABS 0.4  --   EOSABS 0.1  --   BASOSABS 0.0  --     Chemistries   Recent Labs Lab 11/20/14 2227 11/21/14 0347 11/21/14 0737  NA 142 142  --   K 3.2* 3.1*  --   CL 109 110  --   CO2 20* 20*  --   GLUCOSE 94 130*  --   BUN 18 9  --   CREATININE 0.63 0.43*  --   CALCIUM 8.4* 7.3*  --   MG 2.3  --  1.9    CBG: No results for input(s): GLUCAP in the last 168 hours.  GFR Estimated Creatinine Clearance: 70.3 mL/min (by C-G formula based on Cr of 0.43).  Coagulation profile No results for input(s): INR, PROTIME in the last 168 hours.  Cardiac Enzymes No results for input(s): CKMB, TROPONINI, MYOGLOBIN in the last 168 hours.  Invalid  input(s): CK  Invalid input(s): POCBNP No results for input(s): DDIMER in the last 72 hours. No results for input(s): HGBA1C in the last 72 hours. No results for input(s): CHOL, HDL, LDLCALC, TRIG, CHOLHDL, LDLDIRECT in the last 72 hours. No results for input(s): TSH, T4TOTAL, T3FREE, THYROIDAB in the last 72 hours.  Invalid input(s): FREET3 No results for input(s): VITAMINB12, FOLATE, FERRITIN, TIBC, IRON, RETICCTPCT in the last 72 hours. No results for input(s): LIPASE, AMYLASE in the last 72 hours.  Urine Studies No results for input(s): UHGB, CRYS in the last 72 hours.  Invalid input(s): UACOL, UAPR, USPG, UPH, UTP, UGL, UKET, UBIL, UNIT, UROB, ULEU, UEPI, UWBC, URBC, UBAC, CAST, UCOM, BILUA  MICROBIOLOGY: Recent Results (from the past 240 hour(s))  MRSA PCR Screening     Status: None   Collection Time: 11/21/14  3:29 AM  Result Value Ref Range Status   MRSA by PCR NEGATIVE NEGATIVE Final    Comment:        The GeneXpert MRSA Assay (FDA approved for NASAL specimens only), is one component of a comprehensive MRSA colonization surveillance program. It is not intended to diagnose MRSA infection nor to guide or monitor treatment for MRSA infections.     RADIOLOGY STUDIES/RESULTS: No results found.  Oren Binet, MD  [facility] Pager:[phone]  If 7PM-7AM, please contact night-coverage [email or web address] Password [email or web address] 11/21/2014, 11:52 AM   LOS: 0 days

## 2014-11-21 NOTE — Care Management Note (Signed)
Case Management Note  Patient Details  Name: LARIAH FLEER MRN: [id number] Date of Birth: December 11, 1955  Subjective/Objective:         overdose         Action/Plan: home when stable   Expected Discharge Date:      [id number]            Expected Discharge Plan:  Home/Self Care  In-House Referral:  Clinical Social Work  Discharge planning Services  CM Consult, [place]  Post Acute Care Choice:  NA Choice offered to:     DME Arranged:    DME Agency:     HH Arranged:    [place] Agency:     Status of Service:     Medicare Important Message Given:    Date Medicare IM Given:    Medicare IM give by:    Date Additional Medicare IM Given:    Additional Medicare Important Message give by:     If discussed at [place] of Stay Meetings, dates discussed:    Additional Comments:  Leeroy Cha, RN 11/21/2014, 9:54 AM

## 2014-11-21 NOTE — ED Notes (Signed)
Spoke w/ poison control, no further recommendations at this time, states will call back later to check on pt.

## 2014-11-22 ENCOUNTER — Inpatient Hospital Stay (HOSPITAL_COMMUNITY)
Admission: AD | Admit: 2014-11-22 | Discharge: 2014-11-26 | DRG: 897 | Disposition: A | Discharge status: Home / Self Care | Payer: BLUE CROSS/BLUE SHIELD | Source: Intra-hospital | Attending: Psychiatry | Admitting: Psychiatry

## 2014-11-22 ENCOUNTER — Encounter (HOSPITAL_COMMUNITY): Payer: Self-pay | Admitting: Behavioral Health

## 2014-11-22 DIAGNOSIS — E039 Hypothyroidism, unspecified: Secondary | ICD-10-CM

## 2014-11-22 DIAGNOSIS — F419 Anxiety disorder, unspecified: Secondary | ICD-10-CM | POA: Diagnosis present

## 2014-11-22 DIAGNOSIS — R45851 Suicidal ideations: Secondary | ICD-10-CM | POA: Diagnosis not present

## 2014-11-22 DIAGNOSIS — F39 Unspecified mood [affective] disorder: Secondary | ICD-10-CM | POA: Diagnosis present

## 2014-11-22 DIAGNOSIS — F17210 Nicotine dependence, cigarettes, uncomplicated: Secondary | ICD-10-CM | POA: Diagnosis present

## 2014-11-22 DIAGNOSIS — I10 Essential (primary) hypertension: Secondary | ICD-10-CM | POA: Diagnosis present

## 2014-11-22 DIAGNOSIS — F1994 Other psychoactive substance use, unspecified with psychoactive substance-induced mood disorder: Secondary | ICD-10-CM | POA: Diagnosis not present

## 2014-11-22 DIAGNOSIS — F10230 Alcohol dependence with withdrawal, uncomplicated: Secondary | ICD-10-CM | POA: Diagnosis present

## 2014-11-22 DIAGNOSIS — I959 Hypotension, unspecified: Secondary | ICD-10-CM

## 2014-11-22 DIAGNOSIS — F332 Major depressive disorder, recurrent severe without psychotic features: Secondary | ICD-10-CM | POA: Diagnosis present

## 2014-11-22 DIAGNOSIS — T50902A Poisoning by unspecified drugs, medicaments and biological substances, intentional self-harm, initial encounter: Secondary | ICD-10-CM | POA: Diagnosis not present

## 2014-11-22 DIAGNOSIS — F10229 Alcohol dependence with intoxication, unspecified: Secondary | ICD-10-CM | POA: Diagnosis present

## 2014-11-22 DIAGNOSIS — Z79899 Other long term (current) drug therapy: Secondary | ICD-10-CM

## 2014-11-22 DIAGNOSIS — T1491 Suicide attempt: Secondary | ICD-10-CM | POA: Diagnosis not present

## 2014-11-22 DIAGNOSIS — F1010 Alcohol abuse, uncomplicated: Secondary | ICD-10-CM | POA: Diagnosis present

## 2014-11-22 DIAGNOSIS — T43222A Poisoning by selective serotonin reuptake inhibitors, intentional self-harm, initial encounter: Secondary | ICD-10-CM | POA: Diagnosis not present

## 2014-11-22 DIAGNOSIS — Y906 Blood alcohol level of 120-199 mg/100 ml: Secondary | ICD-10-CM | POA: Diagnosis present

## 2014-11-22 LAB — BASIC METABOLIC PANEL
ANION GAP: 9 (ref 5–15)
BUN: 10 mg/dL (ref 6–20)
CALCIUM: 9.2 mg/dL (ref 8.9–10.3)
CO2: 28 mmol/L (ref 22–32)
CREATININE: 0.63 mg/dL (ref 0.44–1.00)
Chloride: 102 mmol/L (ref 101–111)
Glucose, Bld: 109 mg/dL — ABNORMAL HIGH (ref 65–99)
Potassium: 3.6 mmol/L (ref 3.5–5.1)
SODIUM: 139 mmol/L (ref 135–145)

## 2014-11-22 LAB — MAGNESIUM: Magnesium: 2.5 mg/dL — ABNORMAL HIGH (ref 1.7–2.4)

## 2014-11-22 MED ORDER — ADULT MULTIVITAMIN W/MINERALS CH
1.0000 | ORAL_TABLET | Freq: Every day | ORAL | Status: DC
  Administered 2014-11-23 – 2014-11-26 (×4): 1 via ORAL
  Filled 2014-11-22 (×2): qty 1
  Filled 2014-11-22: qty 4
  Filled 2014-11-22 (×3): qty 1

## 2014-11-22 MED ORDER — NICOTINE 21 MG/24HR TD PT24
21.0000 mg | MEDICATED_PATCH | Freq: Every day | TRANSDERMAL | Status: DC
  Administered 2014-11-22 – 2014-11-26 (×5): 21 mg via TRANSDERMAL
  Filled 2014-11-22 (×7): qty 1

## 2014-11-22 MED ORDER — VITAMIN B-1 100 MG PO TABS
100.0000 mg | ORAL_TABLET | Freq: Every day | ORAL | Status: DC
  Administered 2014-11-23 – 2014-11-26 (×4): 100 mg via ORAL
  Filled 2014-11-22 (×6): qty 1

## 2014-11-22 MED ORDER — ONDANSETRON 4 MG PO TBDP: 4.0000 mg | ORAL_TABLET | Freq: Four times a day (QID) | ORAL | Status: AC | PRN

## 2014-11-22 MED ORDER — PNEUMOCOCCAL VAC POLYVALENT 25 MCG/0.5ML IJ INJ: 0.5000 mL | INJECTION | INTRAMUSCULAR | Status: DC

## 2014-11-22 MED ORDER — AMLODIPINE BESYLATE 10 MG PO TABS
10.0000 mg | ORAL_TABLET | Freq: Every day | ORAL | Status: DC
  Administered 2014-11-22 – 2014-11-26 (×5): 10 mg via ORAL
  Filled 2014-11-22 (×6): qty 1
  Filled 2014-11-22: qty 2
  Filled 2014-11-22 (×2): qty 1

## 2014-11-22 MED ORDER — LOPERAMIDE HCL 2 MG PO CAPS: 2.0000 mg | ORAL_CAPSULE | ORAL | Status: AC | PRN

## 2014-11-22 MED ORDER — CHLORDIAZEPOXIDE HCL 25 MG PO CAPS
25.0000 mg | ORAL_CAPSULE | ORAL | Status: AC
  Administered 2014-11-25 (×2): 25 mg via ORAL
  Filled 2014-11-22 (×3): qty 1

## 2014-11-22 MED ORDER — CHLORDIAZEPOXIDE HCL 25 MG PO CAPS
25.0000 mg | ORAL_CAPSULE | Freq: Four times a day (QID) | ORAL | Status: AC | PRN
Start: 2014-11-22 — End: 2014-11-25

## 2014-11-22 MED ORDER — CHLORDIAZEPOXIDE HCL 25 MG PO CAPS
25.0000 mg | ORAL_CAPSULE | Freq: Every day | ORAL | Status: AC
  Administered 2014-11-26: 25 mg via ORAL
  Filled 2014-11-22: qty 1

## 2014-11-22 MED ORDER — ATORVASTATIN CALCIUM 20 MG PO TABS
20.0000 mg | ORAL_TABLET | Freq: Every day | ORAL | Status: DC
  Administered 2014-11-22 – 2014-11-25 (×4): 20 mg via ORAL
  Filled 2014-11-22 (×4): qty 1
  Filled 2014-11-22: qty 2
  Filled 2014-11-22: qty 1
  Filled 2014-11-22: qty 2
  Filled 2014-11-22: qty 1

## 2014-11-22 MED ORDER — HYDROXYZINE HCL 25 MG PO TABS
25.0000 mg | ORAL_TABLET | Freq: Four times a day (QID) | ORAL | Status: AC | PRN
  Administered 2014-11-22 – 2014-11-23 (×2): 25 mg via ORAL
  Filled 2014-11-22 (×3): qty 1

## 2014-11-22 MED ORDER — ALUM & MAG HYDROXIDE-SIMETH 200-200-20 MG/5ML PO SUSP: 30.0000 mL | ORAL | Status: DC | PRN

## 2014-11-22 MED ORDER — CHLORDIAZEPOXIDE HCL 25 MG PO CAPS
25.0000 mg | ORAL_CAPSULE | Freq: Three times a day (TID) | ORAL | Status: AC
  Administered 2014-11-24 (×3): 25 mg via ORAL
  Filled 2014-11-22 (×2): qty 1

## 2014-11-22 MED ORDER — ACETAMINOPHEN 325 MG PO TABS
650.0000 mg | ORAL_TABLET | Freq: Four times a day (QID) | ORAL | Status: DC | PRN
Start: 2014-11-22 — End: 2014-11-26

## 2014-11-22 MED ORDER — MAGNESIUM HYDROXIDE 400 MG/5ML PO SUSP: 30.0000 mL | Freq: Every day | ORAL | Status: DC | PRN

## 2014-11-22 MED ORDER — CHLORDIAZEPOXIDE HCL 25 MG PO CAPS
25.0000 mg | ORAL_CAPSULE | Freq: Four times a day (QID) | ORAL | Status: AC
  Administered 2014-11-22 – 2014-11-23 (×6): 25 mg via ORAL
  Filled 2014-11-22 (×6): qty 1

## 2014-11-22 MED ORDER — THIAMINE HCL 100 MG/ML IJ SOLN: 100.0000 mg | Freq: Once | INTRAMUSCULAR | Status: DC

## 2014-11-22 NOTE — Plan of Care (Signed)
Problem: Spiritual Needs Goal: Ability to function at adequate level Outcome: Progressing Discussed with patient

## 2014-11-22 NOTE — Progress Notes (Signed)
Admission note: Pt presents with flat affect and depressed mood. Pt reported that she is here because she OD on Celexa and was drinking alcohol. Pt stated that she's been overwhelmed at work and felt like her medications were not working. Pt reported that she drinks 6 beers daily during the week and a 12 pack on the weekends. Pt reported that she was last here at [facility] back in [place] when her father was buried.  Pt stated that she was last here for the same behaviors. Pt denies suicidal thoughts at this time.   Pt v/s taken, skin assessed, belongings searched and required documents signed.

## 2014-11-22 NOTE — Clinical Social Work Psych Assess (Signed)
Clinical Social Work Nature conservation officer  Clinical Social Worker:  Ludwig Clarks, [place] Date/Time:  11/22/2014, 1:28 PM Referred By:  Physician Date Referred:  11/22/14 Reason for Referral:  Behavioral Health Issues, Substance Abuse   Presenting Symptoms/Problems  Presenting Symptoms/Problems(in person's/family's own words):  "I drank too much"   Abuse/Neglect/Trauma History  Abuse/Neglect/Trauma History:  Denies History Abuse/Neglect/Trauma History Comments (indicate dates):  denies   Psychiatric History  Psychiatric History:  Inpatient/Hospitalization, Outpatient Treatment, Residential Treatment Psychiatric Medication:  See [facility]   Current Mental Health Hospitalizations/Previous Mental Health History:   [place] in past.    Current Provider:  NA Place and Date:     Current Medications:  See MAR   Previous Inpatient Admission/Date/Reason:  [place]- unknown date   Emotional Health/Current Symptoms  Suicide/Self Harm: None Reported Suicide Attempt in Past (date/description):     Other Harmful Behavior (ex. homicidal ideation) (describe):      Psychotic/Dissociative Symptoms  Psychotic/Dissociative Symptoms: None Reported Other Psychotic/Dissociative Symptoms:       Attention/Behavioral Symptoms  Attention/Behavioral Symptoms: Within Normal Limits Other Attention/Behavioral Symptoms:      Cognitive Impairment  Cognitive Impairment:  Within Normal Limits Other Cognitive Impairment:  NA   Mood and Adjustment  Mood and Adjustment:  Depression, Flat   Stress, Anxiety, Trauma, Any Recent Loss/Stressor  Stress, Anxiety, Trauma, Any Recent Loss/Stressor: Anxiety, Relationship, Other - See Comment (work stress) Anxiety (frequency):    Phobia (specify):    Compulsive Behavior (specify):    Obsessive Behavior (specify):    Other Stress, Anxiety, Trauma, Any Recent Loss/Stressor:  Patient reports a lot of stress at her 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    Substance Abuse/Use beer Substance Abuse/Use: Current Substance Use SBIRT Completed (please refer for detailed history): No Self-reported Substance Use (last use and frequency):  Beer daily  Urinary Drug Screen Completed: Yes Alcohol Level:      Environment/Housing/Living Arrangement  Environmental/Housing/Living Arrangement: Stable Housing Who is in the Home:  Alone  Emergency Contact:  Mom and 2 sons   Pharmacist, community: Stable Income, Stable Employment, Multimedia programmer   Patient's Strengths and Goals  Patient's Strengths and Goals (patient's own words):  Patient reports she wants to stop drinking- acknowldges that she can't continue to drink and function like this- "I feel great when I don't drink"   Clinical Social Worker's Interpretive Summary  Clinical Social Workers Interpretive Summary:  CSW found patient to be cooperative, pleasant and a bit embarrassed and ashamed of her etoh use. She admits to feeling lonely and depressed as well as a lot of work related stress. She has a supportive family as well as work family- they are helping to assist her with this addiction and treatment costs (now as well as in the past)   Disposition  Disposition: Recommend Psych CSW Continuing To Support While In [facility]

## 2014-11-22 NOTE — Progress Notes (Signed)
D   Pt endorses depression and some mild withdrawal symptoms    She also complained of anxiety and poor sleep   She is co-opertive with treatment and interacts appropriately with others   She presently denies suicidal ideation but said it can be off and on and she contracts for safety  Pt reports she just got here and seems to be adjusting to the unit routine A   Verbal support given     Medications administered and effectiveness monitored   Q 15 min checks   Discussed treatment plan and unit activities R   Pt is safe and verbalizes understanding

## 2014-11-22 NOTE — Progress Notes (Signed)
Report given to [facility]

## 2014-11-22 NOTE — Tx Team (Signed)
Initial Interdisciplinary Treatment Plan   PATIENT STRESSORS: Loss of father   PATIENT STRENGTHS: Ability for insight Capable of independent living Motivation for treatment/growth   PROBLEM LIST: Problem List/Patient Goals Date to be addressed Date deferred Reason deferred Estimated date of resolution  alcohol detox 11/22/14     Depression  11/22/14                                                DISCHARGE CRITERIA:  Ability to meet basic life and health needs Adequate post-discharge living arrangements Improved stabilization in mood, thinking, and/or behavior Verbal commitment to aftercare and medication compliance Withdrawal symptoms are absent or subacute and managed without 24-hour nursing intervention  PRELIMINARY DISCHARGE PLAN: Attend aftercare/continuing care group Attend PHP/IOP  PATIENT/FAMIILY INVOLVEMENT: This treatment plan has been presented to and reviewed with the patient, [name], and/or family member.  The patient and family have been given the opportunity to ask questions and make suggestions.  Woodruff Skirvin L 11/22/2014, 5:23 PM

## 2014-11-22 NOTE — Consult Note (Signed)
Psychiatry Consult Follow Up  Reason for Consult:  Alcohol intoxication, depression and overdose Referring Physician:  Dr. Sloan Leiter Patient Identification: [name] MRN:  [id number] Principal Diagnosis: Drug overdose, intentional, substance induced mood disorder Diagnosis:   Patient Active Problem List   Diagnosis Date Noted  . H/O suicide attempt [Z91.89] 11/21/2014  . Drug overdose, intentional [T50.902A] 11/21/2014  . Alcohol intoxication [F10.129] 11/21/2014  . Hypotension [I95.9] 11/21/2014  . 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 Overdose [T50.901A]   . Hypothyroidism [E03.9] 03/31/2014  . Depression [F32.9] 03/31/2014  . Alcohol abuse [F10.10] 12/07/2013  . Alcohol dependence [F10.20] 12/09/2011  . Substance induced mood disorder [F19.94] 12/09/2011    Total Time spent with patient: 30 minutes  Subjective:   [name] is a 59 y.o. female patient admitted with alcohol intoxication, depression and overdose.  HPI:  [name] is a 59 years old female admitted to [facility] long intensive care unit for alcohol intoxication, status post intentional overdose of antidepressant medication Celexa. Patient endorses symptoms of depression, stress from work, tearful, disturbance of sleep and appetite and increased alcohol intake over several months. Patient had has at least 2 previous acute psychiatric hospitalization for depression and alcohol abuse versus dependence. Patient stated she cannot make up our mind if she can voluntarily admit herself to the behavioral [place] because she is concerned about losing her job and at the same time reportedly extremely stressed about her job. Patient stated she is going to talk to her family members and mother regarding possible voluntary psychiatric hospitalization. Patient minimizes her intention overdose of Celexa as a suicide attempt but at the same time she stated that she does not care if she  woke up after taking the pills while intoxicated. Patient stated that she called herself for help after taking the medication and received charcoal. Patient blood alcohol level is 162 and her urine drug screen is negative for drug of abuse. Patient liver function tests are within normal limits. Patient denies previous history of alcohol detox treatment and rehabilitation treatment.   Interval history: Patient continued to have symptoms of depression, tired, sad, unhappy, anxious, worried and stressed out and alcohol abuse. Patient reported she is willing to sign into the behavioral [facility] voluntarily for inpatient treatment and she also hoping to be referred to the [facility] for alcohol rehabilitation treatment when medically and mentally stable. Patient stated she spoke with her mother and her son who wanted her to be in treatment and be safe. Patient complains about feeling tired, and mild symptoms of tremors and shaking's but denies sweating and stomach upset.  Past Medical History:  Past Medical History  Diagnosis Date  . Hypertension   . Depression   . Suicidal overdose   . Alcoholism     Past Surgical History  Procedure Laterality Date  . Fracture surgery      fractured left ankle 2007  . Ganglion cyst excision      left foot   Family History: No family history on file. Social History:  History  Alcohol Use  . 7.2 oz/week  . 12 Cans of beer per week    Comment: daily 6-7 cans of beer     History  Drug Use No    History   Social History  . Marital Status: Single    Spouse Name: N/A  . Number of Children: N/A  . Years of Education: N/A   Social History Main Topics  .  Smoking status: Current Every Day Smoker -- 1.00 packs/day for 5 years    Types: Cigarettes  . Smokeless tobacco: Never Used  . Alcohol Use: 7.2 oz/week    12 Cans of beer per week     Comment: daily 6-7 cans of beer  . Drug Use: No  . Sexual Activity: Yes    Birth Control/ Protection: None    Other Topics Concern  . None   Social History Narrative   Additional Social History: Patient is alone.                          Allergies:  No Known Allergies  Labs:  Results for orders placed or performed during the hospital encounter of 11/20/14 (from the past 48 hour(s))  Acetaminophen level     Status: Abnormal   Collection Time: 11/20/14 10:27 PM  Result Value Ref Range   Acetaminophen (Tylenol), Serum <10 (L) 10 - 30 ug/mL    Comment:        THERAPEUTIC CONCENTRATIONS VARY SIGNIFICANTLY. A RANGE OF 10-30 ug/mL MAY BE AN EFFECTIVE CONCENTRATION FOR MANY PATIENTS. HOWEVER, SOME ARE BEST TREATED AT CONCENTRATIONS OUTSIDE THIS RANGE. ACETAMINOPHEN CONCENTRATIONS >150 ug/mL AT 4 HOURS AFTER INGESTION AND >50 ug/mL AT 12 HOURS AFTER INGESTION ARE OFTEN ASSOCIATED WITH TOXIC REACTIONS.   Comprehensive metabolic panel     Status: Abnormal   Collection Time: 11/20/14 10:27 PM  Result Value Ref Range   Sodium 142 135 - 145 mmol/L   Potassium 3.2 (L) 3.5 - 5.1 mmol/L   Chloride 109 101 - 111 mmol/L   CO2 20 (L) 22 - 32 mmol/L   Glucose, Bld 94 65 - 99 mg/dL   BUN 18 6 - 20 mg/dL   Creatinine, Ser 0.63 0.44 - 1.00 mg/dL   Calcium 8.4 (L) 8.9 - 10.3 mg/dL   Total Protein 7.1 6.5 - 8.1 g/dL   Albumin 4.3 3.5 - 5.0 g/dL   AST 26 15 - 41 U/L   ALT 38 14 - 54 U/L   Alkaline Phosphatase 75 38 - 126 U/L   Total Bilirubin 0.3 0.3 - 1.2 mg/dL   GFR calc non Af Amer >60 >60 mL/min   GFR calc Af Amer >60 >60 mL/min    Comment: (NOTE) The eGFR has been calculated using the CKD EPI equation. This calculation has not been validated in all clinical situations. eGFR's persistently <60 mL/min signify possible Chronic Kidney Disease.    Anion gap 13 5 - 15  CBC WITH DIFFERENTIAL     Status: None   Collection Time: 11/20/14 10:27 PM  Result Value Ref Range   WBC 6.7 4.0 - 10.5 K/uL   RBC 4.24 3.87 - 5.11 MIL/uL   Hemoglobin 12.7 12.0 - 15.0 g/dL   HCT 37.9 36.0 -  46.0 %   MCV 89.4 78.0 - 100.0 fL   MCH 30.0 26.0 - 34.0 pg   MCHC 33.5 30.0 - 36.0 g/dL   RDW 12.9 11.5 - 15.5 %   Platelets 316 150 - 400 K/uL   Neutrophils Relative % 54 43 - 77 %   Neutro Abs 3.7 1.7 - 7.7 K/uL   Lymphocytes Relative 37 12 - 46 %   Lymphs Abs 2.5 0.7 - 4.0 K/uL   Monocytes Relative 6 3 - 12 %   Monocytes Absolute 0.4 0.1 - 1.0 K/uL   Eosinophils Relative 2 0 - 5 %   Eosinophils Absolute 0.1 0.0 -  0.7 K/uL   Basophils Relative 1 0 - 1 %   Basophils Absolute 0.0 0.0 - 0.1 K/uL  Ethanol     Status: Abnormal   Collection Time: 11/20/14 10:27 PM  Result Value Ref Range   Alcohol, Ethyl (B) 162 (H) <5 mg/dL    Comment:        LOWEST DETECTABLE LIMIT FOR SERUM ALCOHOL IS 11 mg/dL FOR MEDICAL PURPOSES ONLY   Salicylate level     Status: None   Collection Time: 11/20/14 10:27 PM  Result Value Ref Range   Salicylate Lvl <2.9 2.8 - 30.0 mg/dL  Magnesium     Status: None   Collection Time: 11/20/14 10:27 PM  Result Value Ref Range   Magnesium 2.3 1.7 - 2.4 mg/dL  Drug screen panel, emergency     Status: None   Collection Time: 11/20/14 10:54 PM  Result Value Ref Range   Opiates NONE DETECTED NONE DETECTED   Cocaine NONE DETECTED NONE DETECTED   Benzodiazepines NONE DETECTED NONE DETECTED   Amphetamines NONE DETECTED NONE DETECTED   Tetrahydrocannabinol NONE DETECTED NONE DETECTED   Barbiturates NONE DETECTED NONE DETECTED    Comment:        DRUG SCREEN FOR MEDICAL PURPOSES ONLY.  IF CONFIRMATION IS NEEDED FOR ANY PURPOSE, NOTIFY LAB WITHIN 5 DAYS.        LOWEST DETECTABLE LIMITS FOR URINE DRUG SCREEN Drug Class       Cutoff (ng/mL) Amphetamine      1000 Barbiturate      200 Benzodiazepine   476 Tricyclics       546 Opiates          300 Cocaine          300 THC              50   Urinalysis, Routine w reflex microscopic     Status: Abnormal   Collection Time: 11/20/14 10:54 PM  Result Value Ref Range   Color, Urine YELLOW YELLOW   APPearance CLEAR  CLEAR   Specific Gravity, Urine 1.004 (L) 1.005 - 1.030   pH 6.0 5.0 - 8.0   Glucose, UA NEGATIVE NEGATIVE mg/dL   Hgb urine dipstick TRACE (A) NEGATIVE   Bilirubin Urine NEGATIVE NEGATIVE   Ketones, ur NEGATIVE NEGATIVE mg/dL   Protein, ur NEGATIVE NEGATIVE mg/dL   Urobilinogen, UA 0.2 0.0 - 1.0 mg/dL   Nitrite NEGATIVE NEGATIVE   Leukocytes, UA NEGATIVE NEGATIVE  Urine microscopic-add on     Status: None   Collection Time: 11/20/14 10:54 PM  Result Value Ref Range   Squamous Epithelial / LPF RARE RARE  POC Urine Pregnancy, ED  (not at [facility])     Status: None   Collection Time: 11/20/14 10:57 PM  Result Value Ref Range   Preg Test, Ur NEGATIVE NEGATIVE    Comment:        THE SENSITIVITY OF THIS METHODOLOGY IS >24 mIU/mL   MRSA PCR Screening     Status: None   Collection Time: 11/21/14  3:29 AM  Result Value Ref Range   MRSA by PCR NEGATIVE NEGATIVE    Comment:        The GeneXpert MRSA Assay (FDA approved for NASAL specimens only), is one component of a comprehensive MRSA colonization surveillance program. It is not intended to diagnose MRSA infection nor to guide or monitor treatment for MRSA infections.   Basic metabolic panel     Status: Abnormal   Collection Time:  11/21/14  3:47 AM  Result Value Ref Range   Sodium 142 135 - 145 mmol/L   Potassium 3.1 (L) 3.5 - 5.1 mmol/L   Chloride 110 101 - 111 mmol/L   CO2 20 (L) 22 - 32 mmol/L   Glucose, Bld 130 (H) 65 - 99 mg/dL   BUN 9 6 - 20 mg/dL   Creatinine, Ser 0.43 (L) 0.44 - 1.00 mg/dL   Calcium 7.3 (L) 8.9 - 10.3 mg/dL   GFR calc non Af Amer >60 >60 mL/min   GFR calc Af Amer >60 >60 mL/min    Comment: (NOTE) The eGFR has been calculated using the CKD EPI equation. This calculation has not been validated in all clinical situations. eGFR's persistently <60 mL/min signify possible Chronic Kidney Disease.    Anion gap 12 5 - 15  CBC     Status: None   Collection Time: 11/21/14  3:47 AM  Result Value Ref Range    WBC 4.7 4.0 - 10.5 K/uL   RBC 4.39 3.87 - 5.11 MIL/uL   Hemoglobin 13.1 12.0 - 15.0 g/dL   HCT 39.6 36.0 - 46.0 %   MCV 90.2 78.0 - 100.0 fL   MCH 29.8 26.0 - 34.0 pg   MCHC 33.1 30.0 - 36.0 g/dL   RDW 13.0 11.5 - 15.5 %   Platelets 304 150 - 400 K/uL  Magnesium     Status: None   Collection Time: 11/21/14  7:37 AM  Result Value Ref Range   Magnesium 1.9 1.7 - 2.4 mg/dL  TSH     Status: None   Collection Time: 11/21/14 12:40 PM  Result Value Ref Range   TSH 3.530 0.350 - 4.500 uIU/mL  Basic metabolic panel     Status: Abnormal   Collection Time: 11/22/14  5:20 AM  Result Value Ref Range   Sodium 139 135 - 145 mmol/L   Potassium 3.6 3.5 - 5.1 mmol/L   Chloride 102 101 - 111 mmol/L   CO2 28 22 - 32 mmol/L   Glucose, Bld 109 (H) 65 - 99 mg/dL   BUN 10 6 - 20 mg/dL   Creatinine, Ser 0.63 0.44 - 1.00 mg/dL   Calcium 9.2 8.9 - 10.3 mg/dL   GFR calc non Af Amer >60 >60 mL/min   GFR calc Af Amer >60 >60 mL/min    Comment: (NOTE) The eGFR has been calculated using the CKD EPI equation. This calculation has not been validated in all clinical situations. eGFR's persistently <60 mL/min signify possible Chronic Kidney Disease.    Anion gap 9 5 - 15  Magnesium     Status: Abnormal   Collection Time: 11/22/14  5:20 AM  Result Value Ref Range   Magnesium 2.5 (H) 1.7 - 2.4 mg/dL    Vitals: Blood pressure 109/70, pulse 65, temperature 98.6 F (37 C), temperature source Oral, resp. rate 18, height '5\' 1"'  (1.549 m), weight 70.1 kg (154 lb 8.7 oz), SpO2 98 %.  Risk to Self: Is patient at risk for suicide?: Yes Risk to Others:   Prior Inpatient Therapy:   Prior Outpatient Therapy:    Current Facility-Administered Medications  Medication Dose Route Frequency Provider Last Rate Last Dose  . enoxaparin (LOVENOX) injection 40 mg  40 mg Subcutaneous Q24H Jonetta Osgood, MD   40 mg at 11/21/14 1834  . folic acid (FOLVITE) tablet 1 mg  1 mg Oral Daily Phillips Grout, MD   1 mg at 11/22/14  0947  .  LORazepam (ATIVAN) injection 0-4 mg  0-4 mg Intravenous Q6H Phillips Grout, MD   Stopped at 11/22/14 1000   Followed by  . [START ON 11/23/2014] LORazepam (ATIVAN) injection 0-4 mg  0-4 mg Intravenous Q12H Phillips Grout, MD      . LORazepam (ATIVAN) tablet 1 mg  1 mg Oral Q6H PRN Phillips Grout, MD       Or  . LORazepam (ATIVAN) injection 1 mg  1 mg Intravenous Q6H PRN Phillips Grout, MD      . multivitamin with minerals tablet 1 tablet  1 tablet Oral Daily Phillips Grout, MD   1 tablet at 11/22/14 [phone]  . ondansetron (ZOFRAN) tablet 4 mg  4 mg Oral Q6H PRN Phillips Grout, MD       Or  . ondansetron (ZOFRAN) injection 4 mg  4 mg Intravenous Q6H PRN Phillips Grout, MD      . potassium chloride SA (K-DUR,KLOR-CON) CR tablet 40 mEq  40 mEq Oral Daily Jonetta Osgood, MD   40 mEq at 11/22/14 0947  . thiamine (VITAMIN B-1) tablet 100 mg  100 mg Oral Daily Phillips Grout, MD   100 mg at 11/22/14 9381   Or  . thiamine (B-1) injection 100 mg  100 mg Intravenous Daily Phillips Grout, MD        Musculoskeletal: Strength & Muscle Tone: decreased Gait & Station: unable to stand Patient leans: N/A  Psychiatric Specialty Exam: Physical Exam  ROS  Blood pressure 109/70, pulse 65, temperature 98.6 F (37 C), temperature source Oral, resp. rate 18, height '5\' 1"'  (1.549 m), weight 70.1 kg (154 lb 8.7 oz), SpO2 98 %.Body mass index is 29.22 kg/(m^2).  General Appearance: Disheveled and Guarded  Eye Contact::  Good  Speech:  Slow  Volume:  Decreased  Mood:  Anxious, Depressed, Dysphoric, Hopeless and Worthless  Affect:  Constricted and Depressed  Thought Process:  Coherent and Goal Directed  Orientation:  Full (Time, Place, and Person)  Thought Content:  Rumination  Suicidal Thoughts:  Yes.  with intent/plan  Homicidal Thoughts:  No  Memory:  Immediate;   Fair Recent;   Fair  Judgement:  Impaired  Insight:  Shallow  Psychomotor Activity:  Decreased and Restlessness  Concentration:   Fair  Recall:  Good  Fund of Knowledge:Good  Language: Good  Akathisia:  Negative  Handed:  Right  AIMS (if indicated):     Assets:  Communication Skills Desire for Improvement Financial Resources/Insurance Housing Leisure Time Physical Health Resilience Social Support Talents/Skills Transportation Vocational/Educational  ADL's:  Impaired  Cognition: WNL  Sleep:      Medical Decision Making: New problem, with additional work up planned, Review of Psycho-Social Stressors (1), Review or order clinical lab tests (1), Discuss test with performing physician (1), Established Problem, Worsening (2), Review or order medicine tests (1), Review of Medication Regimen & Side Effects (2) and Review of New Medication or Change in Dosage (2)  Treatment Plan Summary: Patient presented with increased symptoms of depression, anxiety, alcohol abuse versus dependence and is status post intentional overdose of antidepressant medication with intention to end her life. Patient has a limited psychosocial support. Daily contact with patient to assess and evaluate symptoms and progress in treatment and Medication management  Plan: Spoke with [name], [facility] from behavioral health regarding possible inpatient psychiatric hospitalization placement today as patient is medically stable as per Dr. Sloan Leiter  Suicidal attempt: Patient need for safety sitter  Depression: Patient needed appropriate medication management but currently hold her antidepressant medication secondary to overdose and monitor for the serotonin syndrome  Alcohol abuse versus dependence: Ativan detox protocol and CIWA monitoring Thiamine 100 mg daily has a supportive treatment  Recommend psychiatric Inpatient admission when medically cleared. Supportive therapy provided about ongoing stressors.   Patient has been receiving medication for depression from primary care physician. Patient needed psychiatrist for medication management and  counselor for substance abuse treatment  Appreciate psychiatric consultation and follow up as clinically required Please contact [phone] or [phone] if needs further assistance  Disposition: Patient meets criteria for acute psychiatric hospitalization and hoping she will sign in voluntarily after consulting her family members.   Jassmine Vandruff,JANARDHAHA R. 11/22/2014 10:06 AM

## 2014-11-22 NOTE — [facility] Group Notes (Signed)
Pt attended [place] group and was appropriate and attentive

## 2014-11-22 NOTE — Discharge Summary (Signed)
PATIENT DETAILS Name: [name] Age: 59 y.o. Sex: female Date of Birth: [birth year] MRN: [id number]. Admitting Physician: Jenna Grout, MD Jenna S, MD  Admit Date: 11/20/2014 Discharge date: 11/22/2014  Recommendations for Outpatient Follow-up:  Please resume antihypertensives-when able. Gen. health maintenance and age appropriate cancer screening  PRIMARY DISCHARGE DIAGNOSIS:  Principal Problem:   Drug overdose, intentional Active Problems:   Alcohol abuse   Hypothyroidism   Depression   H/O suicide attempt   Alcohol intoxication   Hypotension   Major depressive disorder, recurrent episode, severe      PAST MEDICAL HISTORY: Past Medical History  Diagnosis Date  . Hypertension   . Depression   . Suicidal overdose   . Alcoholism     DISCHARGE MEDICATIONS: Current Discharge Medication List    CONTINUE these medications which have NOT CHANGED   Details  atorvastatin (LIPITOR) 20 MG tablet Take 20 mg by mouth daily.  Refills: 3      STOP taking these medications     amLODipine (NORVASC) 10 MG tablet      citalopram (CELEXA) 20 MG tablet      ibuprofen (ADVIL,MOTRIN) 200 MG tablet      acamprosate (CAMPRAL) 333 MG tablet      hydrOXYzine (ATARAX/VISTARIL) 25 MG tablet      sertraline (ZOLOFT) 50 MG tablet         ALLERGIES:  No Known Allergies  BRIEF HPI:  See H&P, Labs, Consult and Test reports for all details in brief, patient is a 59 year old female with history of alcohol abuse, depression was admitted for a suicidal attempt.  CONSULTATIONS:   psychiatry  PERTINENT RADIOLOGIC STUDIES: No results found.   PERTINENT LAB RESULTS: CBC:  Recent Labs  11/20/14 2227 11/21/14 0347  WBC 6.7 4.7  HGB 12.7 13.1  HCT 37.9 39.6  PLT 316 304   CMET CMP     Component Value Date/Time   NA 139 11/22/2014 0520   K 3.6 11/22/2014 0520   CL 102 11/22/2014 0520   CO2 28 11/22/2014 0520   GLUCOSE 109* 11/22/2014 0520   BUN  10 11/22/2014 0520   CREATININE 0.63 11/22/2014 0520   CALCIUM 9.2 11/22/2014 0520   PROT 7.1 11/20/2014 2227   ALBUMIN 4.3 11/20/2014 2227   AST 26 11/20/2014 2227   ALT 38 11/20/2014 2227   ALKPHOS 75 11/20/2014 2227   BILITOT 0.3 11/20/2014 2227   GFRNONAA >60 11/22/2014 0520   GFRAA >60 11/22/2014 0520    GFR Estimated Creatinine Clearance: 70.3 mL/min (by C-G formula based on Cr of 0.63). No results for input(Vaughn): LIPASE, AMYLASE in the last 72 hours. No results for input(Vaughn): CKTOTAL, CKMB, CKMBINDEX, TROPONINI in the last 72 hours. Invalid input(Vaughn): POCBNP No results for input(Vaughn): DDIMER in the last 72 hours. No results for input(Vaughn): HGBA1C in the last 72 hours. No results for input(Vaughn): CHOL, HDL, LDLCALC, TRIG, CHOLHDL, LDLDIRECT in the last 72 hours.  Recent Labs  11/21/14 1240  TSH 3.530   No results for input(Vaughn): VITAMINB12, FOLATE, FERRITIN, TIBC, IRON, RETICCTPCT in the last 72 hours. Coags: No results for input(Vaughn): INR in the last 72 hours.  Invalid input(Vaughn): PT Microbiology: Recent Results (from the past 240 hour(Vaughn))  MRSA PCR Screening     Status: None   Collection Time: 11/21/14  3:29 AM  Result Value Ref Range Status   MRSA by PCR NEGATIVE NEGATIVE Final    Comment:        The  GeneXpert MRSA Assay (FDA approved for NASAL specimens only), is one component of a comprehensive MRSA colonization surveillance program. It is not intended to diagnose MRSA infection nor to guide or monitor treatment for MRSA infections.      BRIEF HOSPITAL COURSE:  Drug overdose, intentional: Suicidal attempt with alcohol and 10 tablets of Celexa. QTC has normalized. Psych consulted, maintain sitter. Recommendations are to transfer to inpatient psychiatry. Currently stable to be transferred.  Active Problems:  Alcohol abuse: Last drink 5/22. No signs of withdrawal. Managed with Ativan per protocol.   Transient hypotension: Likely secondary to volume depletion.  Resolved with IV fluids. Continue to hold antihypertensives-resume when able   Hypothyroidism: TSH within normal limits-patient claims that her PCP took off levothyroxine. Continue to monitor off levothyroxine.    Dyslipidemia: [name] statins on discharge     Depression:Celexa on hold. Seen by psychiatry, recommendations are to transfer to inpatient psychiatry for further optimization   Hypokalemia: Repleted, check periodically   Hypertension: Had transient hypotension on admission-continue to hold amlodipine. Resume when able  TODAY-DAY OF DISCHARGE:  Subjective:   [name] today has no headache,no chest abdominal pain,no new weakness tingling or numbness  Objective:   Blood pressure 109/70, pulse 65, temperature 98.6 F (37 C), temperature source Oral, resp. rate 18, height 5\' 1"  (1.549 m), weight 70.1 kg (154 lb 8.7 oz), SpO2 98 %.  Intake/Output Summary (Last 24 hours) at 11/22/14 1316 Last data filed at 11/22/14 0825  Gross per 24 hour  Intake    660 ml  Output    800 ml  Net   -140 ml   Filed Weights   11/20/14 2154 11/21/14 0330  Weight: 65.772 kg (145 lb) 70.1 kg (154 lb 8.7 oz)    Exam Awake Alert, Oriented *3, No new F.N deficits, Normal affect [place].AT,PERRAL Supple Neck,No JVD, No cervical lymphadenopathy appriciated.  Symmetrical Chest wall movement, Good air movement bilaterally, CTAB RRR,No Gallops,Rubs or new Murmurs, No Parasternal Heave +ve B.Sounds, Abd Soft, Non tender, No organomegaly appriciated, No rebound -guarding or rigidity. No Cyanosis, Clubbing or edema, No new Rash or bruise  DISCHARGE CONDITION: Stable  DISPOSITION: Inpatient psychiatry when bed available  DISCHARGE INSTRUCTIONS:    Activity:  As tolerated   Diet recommendation: Heart Healthy diet  Discharge Instructions    Diet - low sodium heart healthy    Complete by:  As directed      Increase activity slowly    Complete by:  As directed           Follow-up  Information    Follow up with Jenna Schwalbe, MD. Schedule an appointment as soon as possible for a visit in 2 weeks.   Specialty:  Family Medicine   Contact information:   [place] [id number] [phone]      Total Time spent on discharge equals 25 minutes.  SignedOren Vaughn 11/22/2014 1:16 PM

## 2014-11-22 NOTE — Progress Notes (Signed)
Patient has been accepted to [facility] today. Patient agreeable to this plan- she acknowledges she cannot continue to drink and is optimistic. Voluntary admission paper signed- RN to call report and [facility] arranged for [place], MSW, [place]

## 2014-11-23 DIAGNOSIS — F1994 Other psychoactive substance use, unspecified with psychoactive substance-induced mood disorder: Secondary | ICD-10-CM

## 2014-11-23 DIAGNOSIS — F10230 Alcohol dependence with withdrawal, uncomplicated: Principal | ICD-10-CM

## 2014-11-23 DIAGNOSIS — F332 Major depressive disorder, recurrent severe without psychotic features: Secondary | ICD-10-CM

## 2014-11-23 MED ORDER — ACAMPROSATE CALCIUM 333 MG PO TBEC
666.0000 mg | DELAYED_RELEASE_TABLET | Freq: Three times a day (TID) | ORAL | Status: DC
  Administered 2014-11-23 – 2014-11-26 (×9): 666 mg via ORAL
  Filled 2014-11-23 (×7): qty 2
  Filled 2014-11-23 (×2): qty 24
  Filled 2014-11-23 (×2): qty 2
  Filled 2014-11-23: qty 24
  Filled 2014-11-23 (×2): qty 2

## 2014-11-23 NOTE — H&P (Signed)
Psychiatric Admission Assessment Adult  Patient Identification: [name] MRN:  [id number] Date of Evaluation:  11/23/2014 Chief Complaint:  SUBSTANCE INDUCED MOOD DISORDER Principal Diagnosis: <principal problem not specified> Diagnosis:   Patient Active Problem List   Diagnosis Date Noted  . ETOH abuse [F10.10] 11/22/2014  . H/O suicide attempt [Z91.89] 11/21/2014  . Drug overdose, intentional [T50.902A] 11/21/2014  . Alcohol intoxication [F10.129] 11/21/2014  . Hypotension [I95.9] 11/21/2014  . 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 Overdose [T50.901A]   . Hypothyroidism [E03.9] 03/31/2014  . Depression [F32.9] 03/31/2014  . Alcohol abuse [F10.10] 12/07/2013  . Alcohol dependence [F10.20] 12/09/2011  . Substance induced mood disorder [F19.94] 12/09/2011   History of Present Illness:: 59 Y/O female who states she has been drinking every day to relief her stress. 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 Patient endorses symptoms of depression, stress from work, tearful, disturbance of sleep and appetite and increased alcohol intake over several months. Patient had has at least 2 previous acute psychiatric hospitalization for depression and alcohol abuse versus dependence. Patient stated she cannot make up our mind if she can voluntarily admit herself to the behavioral [facility] because she is concerned about losing her job and at the same time reportedly extremely stressed about her job. Patient stated she is going to talk to her family members and mother regarding possible voluntary psychiatric  hospitalization. Patient minimizes her intention overdose of Celexa as a suicide attempt but at the same time she stated that she does not care if she woke up after taking the pills while intoxicated. Patient stated that she called herself for help after taking the medication and received charcoal. Patient blood alcohol level is 162 and her urine drug screen is negative for drug of abuse. Patient liver function tests are within normal limits. Patient denies previous history of alcohol detox treatment and rehabilitation treatment Elements:  Location:  alcohol dependence, major depression. Quality:  increasingly more dysfunctinal increased use of alcohol getting more depressed OD. Severity:  Severe. Timing:  every day. Duration:  building up worst last couple of weeks. Context:  increased use of alcohol to cope with stress among others demands from work, getting more depressed as she increases her alcohol consumptions, OD while intoxicated. Associated Signs/Symptoms: Depression Symptoms:  depressed mood, anhedonia, fatigue, difficulty concentrating, anxiety, loss of energy/fatigue, weight loss, (Hypo) Manic Symptoms:  Irritable Mood, Labiality of Mood, Anxiety Symptoms:  Excessive Worry, Psychotic Symptoms:  denies PTSD Symptoms: Negative Total Time spent with patient: 45 minutes  Past Medical History:  Past Medical History  Diagnosis Date  . Hypertension   . Depression   . Suicidal overdose   . Alcoholism     Past Surgical History  Procedure Laterality Date  . Fracture surgery      fractured left ankle 2007  . Ganglion cyst excision      left foot   Family History: History reviewed. No pertinent family history.  Mother dealt with depression in and out of the hospital when young, father alcoholic Social History:  History  Alcohol Use  . 7.2 oz/week  . 12 Cans of beer per week  Comment: daily 6-7 cans of beer     History  Drug Use No    History   Social History  .  Marital Status: Single    Spouse Name: N/A  . Number of Children: N/A  . Years of Education: N/A   Social History Main Topics  . Smoking status: Current Every Day Smoker -- 1.00 packs/day for 5 years    Types: Cigarettes  . Smokeless tobacco: Never Used  . Alcohol Use: 7.2 oz/week    12 Cans of beer per week     Comment: daily 6-7 cans of beer  . Drug Use: No  . Sexual Activity: Yes    Birth Control/ Protection: None   Other Topics Concern  . None   Social History Narrative  Lives by herself, divorced, two kids boys 35, 21. 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 States the amount of work has increased   Additional Social History:                          Musculoskeletal: Strength & Muscle Tone: within normal limits Gait & Station: normal Patient leans: N/A  Psychiatric Specialty Exam: Physical Exam  ROS  Blood pressure 112/78, pulse 91, temperature 98.5 F (36.9 C), temperature source Oral, resp. rate 16, height _0  (1.549 m), weight 68.04 kg (150 lb).Body mass index is 28.36 kg/(m^2).  General Appearance: Fairly Groomed  Engineer, water::  Fair  Speech:  Clear and Coherent  Volume:  Normal  Mood:  Anxious and Depressed  Affect:  sad anxious worried  Thought Process:  Coherent and Goal Directed  Orientation:  Full (Time, Place, and Person)  Thought Content:  symptoms events worries concerns  Suicidal Thoughts:  No  Homicidal Thoughts:  No  Memory:  Immediate;   Fair Recent;   Fair Remote;   Fair  Judgement:  Fair  Insight:  Present  Psychomotor Activity:  Restlessness  Concentration:  Fair  Recall:  [employer] of Knowledge:Fair  Language: Fair  Akathisia:  No  Handed:  Right  AIMS (if indicated):     Assets:  Desire for Improvement Housing Vocational/Educational  ADL's:  Intact  Cognition: WNL  Sleep:  Number  of Hours: 6.75   Risk to Self: Is patient at risk for suicide?: No What has been your use of drugs/alcohol within the last 12 months?: daily use, after work Pt drinks 6-7 beers during the week; Pt binges on the weeked, using 8-15 beers (regular cans) Risk to Others:   Prior Inpatient Therapy:  [facility] Prior Outpatient Therapy:  PCP prescribes Celexa saw a counselor at the [facility]   Alcohol Screening: Patient refused Alcohol Screening Tool: Yes 1. How often do you have a drink containing alcohol?: 4 or more times a week 2. How many drinks containing alcohol do you have on a typical day when you are drinking?: 7, 8, or 9 3. How often do you have six or more drinks on one occasion?: Daily or almost daily Preliminary Score: 7 4. How often during the last year have you found that you were not able to stop drinking once you had started?: Daily or almost daily 6. How often during the last year have you needed a first drink in the morning to get yourself going after a heavy  drinking session?: Never 7. How often during the last year have you had a feeling of guilt of remorse after drinking?: Daily or almost daily 8. How often during the last year have you been unable to remember what happened the night before because you had been drinking?: Never 9. Have you or someone else been injured as a result of your drinking?: No 10. Has a relative or friend or a doctor or another health worker been concerned about your drinking or suggested you cut down?: Yes, during the last year Alcohol Use Disorder Identification Test Final Score (AUDIT): 23 Brief Intervention: Yes  Allergies:  No Known Allergies Lab Results:  Results for orders placed or performed during the hospital encounter of 11/20/14 (from the past 48 hour(s))  TSH     Status: None   Collection Time: 11/21/14 12:40 PM  Result Value Ref Range   TSH 3.530 0.350 - 4.500 uIU/mL  Basic metabolic panel     Status: Abnormal   Collection Time:  11/22/14  5:20 AM  Result Value Ref Range   Sodium 139 135 - 145 mmol/L   Potassium 3.6 3.5 - 5.1 mmol/L   Chloride 102 101 - 111 mmol/L   CO2 28 22 - 32 mmol/L   Glucose, Bld 109 (H) 65 - 99 mg/dL   BUN 10 6 - 20 mg/dL   Creatinine, Ser 0.63 0.44 - 1.00 mg/dL   Calcium 9.2 8.9 - 10.3 mg/dL   GFR calc non Af Amer >60 >60 mL/min   GFR calc Af Amer >60 >60 mL/min    Comment: (NOTE) The eGFR has been calculated using the CKD EPI equation. This calculation has not been validated in all clinical situations. eGFR's persistently <60 mL/min signify possible Chronic Kidney Disease.    Anion gap 9 5 - 15  Magnesium     Status: Abnormal   Collection Time: 11/22/14  5:20 AM  Result Value Ref Range   Magnesium 2.5 (H) 1.7 - 2.4 mg/dL   Current Medications: Current Facility-Administered Medications  Medication Dose Route Frequency Provider Last Rate Last Dose  . acetaminophen (TYLENOL) tablet 650 mg  650 mg Oral Q6H PRN Kerrie Buffalo, NP      . alum & mag hydroxide-simeth (MAALOX/MYLANTA) 200-200-20 MG/5ML suspension 30 mL  30 mL Oral Q4H PRN Kerrie Buffalo, NP      . amLODipine (NORVASC) tablet 10 mg  10 mg Oral Daily Kerrie Buffalo, NP   10 mg at 11/23/14 0830  . atorvastatin (LIPITOR) tablet 20 mg  20 mg Oral q1800 Kerrie Buffalo, NP   20 mg at 11/22/14 1826  . chlordiazePOXIDE (LIBRIUM) capsule 25 mg  25 mg Oral Q6H PRN Kerrie Buffalo, NP      . chlordiazePOXIDE (LIBRIUM) capsule 25 mg  25 mg Oral QID Kerrie Buffalo, NP   25 mg at 11/23/14 1129   Followed by  . [START ON 11/24/2014] chlordiazePOXIDE (LIBRIUM) capsule 25 mg  25 mg Oral TID Kerrie Buffalo, NP       Followed by  . [START ON 11/25/2014] chlordiazePOXIDE (LIBRIUM) capsule 25 mg  25 mg Oral BH-qamhs Kerrie Buffalo, NP       Followed by  . [START ON 11/26/2014] chlordiazePOXIDE (LIBRIUM) capsule 25 mg  25 mg Oral Daily Kerrie Buffalo, NP      . hydrOXYzine (ATARAX/VISTARIL) tablet 25 mg  25 mg Oral Q6H PRN Kerrie Buffalo, NP   25  mg at 11/22/14 2135  . loperamide (IMODIUM) capsule 2-4 mg  2-4  mg Oral PRN Kerrie Buffalo, NP      . magnesium hydroxide (MILK OF MAGNESIA) suspension 30 mL  30 mL Oral Daily PRN Kerrie Buffalo, NP      . multivitamin with minerals tablet 1 tablet  1 tablet Oral Daily Kerrie Buffalo, NP   1 tablet at 11/23/14 [phone]  . nicotine (NICODERM CQ - dosed in mg/24 hours) patch 21 mg  21 mg Transdermal Daily Nicholaus Bloom, MD   21 mg at 11/23/14 0739  . ondansetron (ZOFRAN-ODT) disintegrating tablet 4 mg  4 mg Oral Q6H PRN Kerrie Buffalo, NP      . pneumococcal 23 valent vaccine (PNU-IMMUNE) injection 0.5 mL  0.5 mL Intramuscular Tomorrow-1000 Nicholaus Bloom, MD      . thiamine (B-1) injection 100 mg  100 mg Intramuscular Once Kerrie Buffalo, NP   100 mg at 11/22/14 1807  . thiamine (VITAMIN B-1) tablet 100 mg  100 mg Oral Daily Kerrie Buffalo, NP   100 mg at 11/23/14 9323   PTA Medications: Prescriptions prior to admission  Medication Sig Dispense Refill Last Dose  . amLODipine (NORVASC) 10 MG tablet Take 10 mg by mouth daily.   11/20/2014  . atorvastatin (LIPITOR) 20 MG tablet Take 20 mg by mouth daily.   3 Past Month at Unknown time  . citalopram (CELEXA) 20 MG tablet Take 20 mg by mouth daily.   11/20/2014  . ibuprofen (ADVIL,MOTRIN) 200 MG tablet Take 200 mg by mouth every 6 (six) hours as needed for moderate pain.   11/20/2014    Previous Psychotropic Medications: Yes Celexa, Zoloft  Substance Abuse History in the last 12 months:  Yes.      Consequences of Substance Abuse: Withdrawal Symptoms:   Nausea Tremors  Results for orders placed or performed during the hospital encounter of 11/20/14 (from the past 72 hour(s))  Acetaminophen level     Status: Abnormal   Collection Time: 11/20/14 10:27 PM  Result Value Ref Range   Acetaminophen (Tylenol), Serum <10 (L) 10 - 30 ug/mL    Comment:        THERAPEUTIC CONCENTRATIONS VARY SIGNIFICANTLY. A RANGE OF 10-30 ug/mL MAY BE AN  EFFECTIVE CONCENTRATION FOR MANY PATIENTS. HOWEVER, SOME ARE BEST TREATED AT CONCENTRATIONS OUTSIDE THIS RANGE. ACETAMINOPHEN CONCENTRATIONS >150 ug/mL AT 4 HOURS AFTER INGESTION AND >50 ug/mL AT 12 HOURS AFTER INGESTION ARE OFTEN ASSOCIATED WITH TOXIC REACTIONS.   Comprehensive metabolic panel     Status: Abnormal   Collection Time: 11/20/14 10:27 PM  Result Value Ref Range   Sodium 142 135 - 145 mmol/L   Potassium 3.2 (L) 3.5 - 5.1 mmol/L   Chloride 109 101 - 111 mmol/L   CO2 20 (L) 22 - 32 mmol/L   Glucose, Bld 94 65 - 99 mg/dL   BUN 18 6 - 20 mg/dL   Creatinine, Ser 0.63 0.44 - 1.00 mg/dL   Calcium 8.4 (L) 8.9 - 10.3 mg/dL   Total Protein 7.1 6.5 - 8.1 g/dL   Albumin 4.3 3.5 - 5.0 g/dL   AST 26 15 - 41 U/L   ALT 38 14 - 54 U/L   Alkaline Phosphatase 75 38 - 126 U/L   Total Bilirubin 0.3 0.3 - 1.2 mg/dL   GFR calc non Af Amer >60 >60 mL/min   GFR calc Af Amer >60 >60 mL/min    Comment: (NOTE) The eGFR has been calculated using the CKD EPI equation. This calculation has not been validated in all clinical situations.  eGFR's persistently <60 mL/min signify possible Chronic Kidney Disease.    Anion gap 13 5 - 15  CBC WITH DIFFERENTIAL     Status: None   Collection Time: 11/20/14 10:27 PM  Result Value Ref Range   WBC 6.7 4.0 - 10.5 K/uL   RBC 4.24 3.87 - 5.11 MIL/uL   Hemoglobin 12.7 12.0 - 15.0 g/dL   HCT 37.9 36.0 - 46.0 %   MCV 89.4 78.0 - 100.0 fL   MCH 30.0 26.0 - 34.0 pg   MCHC 33.5 30.0 - 36.0 g/dL   RDW 12.9 11.5 - 15.5 %   Platelets 316 150 - 400 K/uL   Neutrophils Relative % 54 43 - 77 %   Neutro Abs 3.7 1.7 - 7.7 K/uL   Lymphocytes Relative 37 12 - 46 %   Lymphs Abs 2.5 0.7 - 4.0 K/uL   Monocytes Relative 6 3 - 12 %   Monocytes Absolute 0.4 0.1 - 1.0 K/uL   Eosinophils Relative 2 0 - 5 %   Eosinophils Absolute 0.1 0.0 - 0.7 K/uL   Basophils Relative 1 0 - 1 %   Basophils Absolute 0.0 0.0 - 0.1 K/uL  Ethanol     Status: Abnormal   Collection Time:  11/20/14 10:27 PM  Result Value Ref Range   Alcohol, Ethyl (B) 162 (H) <5 mg/dL    Comment:        LOWEST DETECTABLE LIMIT FOR SERUM ALCOHOL IS 11 mg/dL FOR MEDICAL PURPOSES ONLY   Salicylate level     Status: None   Collection Time: 11/20/14 10:27 PM  Result Value Ref Range   Salicylate Lvl <3.6 2.8 - 30.0 mg/dL  Magnesium     Status: None   Collection Time: 11/20/14 10:27 PM  Result Value Ref Range   Magnesium 2.3 1.7 - 2.4 mg/dL  Drug screen panel, emergency     Status: None   Collection Time: 11/20/14 10:54 PM  Result Value Ref Range   Opiates NONE DETECTED NONE DETECTED   Cocaine NONE DETECTED NONE DETECTED   Benzodiazepines NONE DETECTED NONE DETECTED   Amphetamines NONE DETECTED NONE DETECTED   Tetrahydrocannabinol NONE DETECTED NONE DETECTED   Barbiturates NONE DETECTED NONE DETECTED    Comment:        DRUG SCREEN FOR MEDICAL PURPOSES ONLY.  IF CONFIRMATION IS NEEDED FOR ANY PURPOSE, NOTIFY LAB WITHIN 5 DAYS.        LOWEST DETECTABLE LIMITS FOR URINE DRUG SCREEN Drug Class       Cutoff (ng/mL) Amphetamine      1000 Barbiturate      200 Benzodiazepine   468 Tricyclics       032 Opiates          300 Cocaine          300 THC              50   Urinalysis, Routine w reflex microscopic     Status: Abnormal   Collection Time: 11/20/14 10:54 PM  Result Value Ref Range   Color, Urine YELLOW YELLOW   APPearance CLEAR CLEAR   Specific Gravity, Urine 1.004 (L) 1.005 - 1.030   pH 6.0 5.0 - 8.0   Glucose, UA NEGATIVE NEGATIVE mg/dL   Hgb urine dipstick TRACE (A) NEGATIVE   Bilirubin Urine NEGATIVE NEGATIVE   Ketones, ur NEGATIVE NEGATIVE mg/dL   Protein, ur NEGATIVE NEGATIVE mg/dL   Urobilinogen, UA 0.2 0.0 - 1.0 mg/dL   Nitrite NEGATIVE NEGATIVE  Leukocytes, UA NEGATIVE NEGATIVE  Urine microscopic-add on     Status: None   Collection Time: 11/20/14 10:54 PM  Result Value Ref Range   Squamous Epithelial / LPF RARE RARE  POC Urine Pregnancy, ED  (not at [facility])      Status: None   Collection Time: 11/20/14 10:57 PM  Result Value Ref Range   Preg Test, Ur NEGATIVE NEGATIVE    Comment:        THE SENSITIVITY OF THIS METHODOLOGY IS >24 mIU/mL   MRSA PCR Screening     Status: None   Collection Time: 11/21/14  3:29 AM  Result Value Ref Range   MRSA by PCR NEGATIVE NEGATIVE    Comment:        The GeneXpert MRSA Assay (FDA approved for NASAL specimens only), is one component of a comprehensive MRSA colonization surveillance program. It is not intended to diagnose MRSA infection nor to guide or monitor treatment for MRSA infections.   Basic metabolic panel     Status: Abnormal   Collection Time: 11/21/14  3:47 AM  Result Value Ref Range   Sodium 142 135 - 145 mmol/L   Potassium 3.1 (L) 3.5 - 5.1 mmol/L   Chloride 110 101 - 111 mmol/L   CO2 20 (L) 22 - 32 mmol/L   Glucose, Bld 130 (H) 65 - 99 mg/dL   BUN 9 6 - 20 mg/dL   Creatinine, Ser 0.43 (L) 0.44 - 1.00 mg/dL   Calcium 7.3 (L) 8.9 - 10.3 mg/dL   GFR calc non Af Amer >60 >60 mL/min   GFR calc Af Amer >60 >60 mL/min    Comment: (NOTE) The eGFR has been calculated using the CKD EPI equation. This calculation has not been validated in all clinical situations. eGFR's persistently <60 mL/min signify possible Chronic Kidney Disease.    Anion gap 12 5 - 15  CBC     Status: None   Collection Time: 11/21/14  3:47 AM  Result Value Ref Range   WBC 4.7 4.0 - 10.5 K/uL   RBC 4.39 3.87 - 5.11 MIL/uL   Hemoglobin 13.1 12.0 - 15.0 g/dL   HCT 39.6 36.0 - 46.0 %   MCV 90.2 78.0 - 100.0 fL   MCH 29.8 26.0 - 34.0 pg   MCHC 33.1 30.0 - 36.0 g/dL   RDW 13.0 11.5 - 15.5 %   Platelets 304 150 - 400 K/uL  Magnesium     Status: None   Collection Time: 11/21/14  7:37 AM  Result Value Ref Range   Magnesium 1.9 1.7 - 2.4 mg/dL  TSH     Status: None   Collection Time: 11/21/14 12:40 PM  Result Value Ref Range   TSH 3.530 0.350 - 4.500 uIU/mL  Basic metabolic panel     Status: Abnormal   Collection  Time: 11/22/14  5:20 AM  Result Value Ref Range   Sodium 139 135 - 145 mmol/L   Potassium 3.6 3.5 - 5.1 mmol/L   Chloride 102 101 - 111 mmol/L   CO2 28 22 - 32 mmol/L   Glucose, Bld 109 (H) 65 - 99 mg/dL   BUN 10 6 - 20 mg/dL   Creatinine, Ser 0.63 0.44 - 1.00 mg/dL   Calcium 9.2 8.9 - 10.3 mg/dL   GFR calc non Af Amer >60 >60 mL/min   GFR calc Af Amer >60 >60 mL/min    Comment: (NOTE) The eGFR has been calculated using the CKD EPI equation. This  calculation has not been validated in all clinical situations. eGFR's persistently <60 mL/min signify possible Chronic Kidney Disease.    Anion gap 9 5 - 15  Magnesium     Status: Abnormal   Collection Time: 11/22/14  5:20 AM  Result Value Ref Range   Magnesium 2.5 (H) 1.7 - 2.4 mg/dL    Observation Level/Precautions:  15 minute checks  Laboratory:  As per the ED  Psychotherapy:  Individual/group  Medications:  Librium detox protocol  Consultations:    Discharge Concerns:    Estimated LOS: 3-5 days  Other:     Psychological Evaluations: No   Treatment Plan Summary: Daily contact with patient to assess and evaluate symptoms and progress in treatment and Medication management Supportive approach/coping skills Alcohol Dependence; Librium detox protocol Alcohol cravings: will start Campral 333 mg two TID Will work a relapse prevention plan Depression; she still feels Celexa has worked the best for her, will consider resuming it by tomorrow and increasing the dose to 30 mg daily. She has felt it has not worked as well lately but at the same time she recognizes she has been drinking while taking it what can affect its effectiveness Work stress; will work on stress management/CBT/Mindfulness Once D/C will refer to the [facility] Medical Decision Making:  Review of Psycho-Social Stressors (1), Review or order clinical lab tests (1), Review of Medication Regimen & Side Effects (2) and Review of New Medication or Change in Dosage (2)  I  certify that inpatient services furnished can reasonably be expected to improve the patient's condition.   Owen Pratte A 5/25/201611:31 AM
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
## 2014-11-23 NOTE — BHH Group Notes (Signed)
[facility] LCSW Aftercare Discharge Planning Group Note  11/23/2014 8:45 AM  Participation Quality: Alert, Appropriate and Oriented  Mood/Affect: Mood Congruent  Depression Rating: 5  Anxiety Rating: 5  Thoughts of Suicide: Pt denies SI/HI  Will you contract for safety? Yes  Current AVH: Pt denies  Plan for Discharge/Comments: Pt attended discharge planning group and actively participated in group. CSW provided pt with today's workbook. Pt new to the unit. Reports moderate anxiety and depression. Pt verbalized that she will return home but is unsure about outpatient services at this time but is interested in setting up appointments.   Transportation Means: Pt reports access to transportation  Supports: No supports mentioned at this time  Peri Maris, Latanya Presser 11/23/2014 9:33 AM
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
## 2014-11-23 NOTE — Tx Team (Signed)
Interdisciplinary Treatment Plan Update (Adult) Date: 11/23/2014   Date: 11/23/2014 4:42 PM  Progress in Treatment:  Attending groups: Yes  Participating in groups: Yes  Taking medication as prescribed: Yes  Tolerating medication: Yes  Family/Significant othe contact made: No, CSW to make contact with mother Patient understands diagnosis: Yes Discussing patient identified problems/goals with staff: Yes  Medical problems stabilized or resolved: Yes  Denies suicidal/homicidal ideation: Yes Patient has not harmed self or Others: Yes   New problem(s) identified:   Discharge Plan or Barriers: 11/23/14: Pt is considering different outpatient treatment options, particularly [facility] which is currently operating on a 2 week waiting list. Pt is unsure if she will be able to do a 6-8 week program due to her employment. CSW will continue to assess.  Additional comments:  Patient is a 59 year old Caucasian female with a diagnosis of Alcohol Use Disorder, severe. Pt was admitted from ICU after an overdose on Celexa. Pt reports that she feels this was a cry for help and a way to get treatment. She reports being motivated for outpatient treatment and is determined to have a plan in place for discharge. She identifies her mother, sons, and siblings as being supportive. She has worked at her place of employment for 26 years and feels that if she does not find a successful treatment [facility] and follow through with it that she will lose her job. Pt denies SI/HI at time of assessment. Pt presented as pleasant and cooperative, alert and oriented x4.  Reason for Continuation of Hospitalization:  Anxiety Depression Medication stabilization Suicidal ideation  Estimated length of stay: 3-5 days  For review of initial/current patient goals, please see plan of care.   Attendees:  Attendees:  Patient:    Family:    Physician: Dr. Sabra Heck MD  11/23/2014 4:42 PM  Nursing: Lars Pinks, RN Case manager  11/23/2014  4:42 PM  Clinical Social Worker Norman Clay, MSW 11/23/2014 4:42 PM  Other: Verdene Lennert 11/23/2014 4:42 PM  Other:  Lawernce Pitts, RN 11/23/2014 4:42 PM  Other: , RN Charge Nurse 11/23/2014 4:42 PM  Other:     Peri Maris, Latanya Presser MSW

## 2014-11-23 NOTE — BHH Suicide Risk Assessment (Signed)
[facility] Admission Suicide Risk Assessment   Nursing information obtained from:  Patient Demographic factors:  Living alone Current Mental Status:  Suicidal ideation indicated by others, Self-harm behaviors Loss Factors:  Loss of significant relationship Historical Factors:  Impulsivity Risk Reduction Factors:  Sense of responsibility to family, Employed, Positive social support Total Time spent with patient: 45 minutes Principal Problem: Alcohol dependence Diagnosis:   Patient Active Problem List   Diagnosis Date Noted  . H/O suicide attempt [Z91.89] 11/21/2014  . Drug overdose, intentional [T50.902A] 11/21/2014  . Alcohol intoxication [F10.129] 11/21/2014  . Hypotension [I95.9] 11/21/2014  . 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 Overdose [T50.901A]   . Hypothyroidism [E03.9] 03/31/2014  . Depression [F32.9] 03/31/2014  . Alcohol abuse [F10.10] 12/07/2013  . Alcohol dependence [F10.20] 12/09/2011  . Substance induced mood disorder [F19.94] 12/09/2011     Continued Clinical Symptoms:  Alcohol Use Disorder Identification Test Final Score (AUDIT): 23 The "Alcohol Use Disorders Identification Test", Guidelines for Use in Primary Care, Second Edition.  World Pharmacologist [facility]). Score between 0-7:  no or low risk or alcohol related problems. Score between 8-15:  moderate risk of alcohol related problems. Score between 16-19:  high risk of alcohol related problems. Score 20 or above:  warrants further diagnostic evaluation for alcohol dependence and treatment.   CLINICAL FACTORS:   Depression:   Comorbid alcohol abuse/dependence Alcohol/Substance Abuse/Dependencies  Psychiatric Specialty Exam: Physical Exam  ROS  Blood pressure 128/83, pulse 98, temperature 98.5 F (36.9 C), temperature source Oral, resp. rate 16, height 5\' 1"  (1.549 m), weight 68.04 kg (150 lb).Body mass index is 28.36 kg/(m^2).   COGNITIVE FEATURES THAT CONTRIBUTE TO  RISK:  Closed-mindedness, Polarized thinking and Thought constriction (tunnel vision)    SUICIDE RISK:   Mild:  Suicidal ideation of limited frequency, intensity, duration, and specificity.  There are no identifiable plans, no associated intent, mild dysphoria and related symptoms, good self-control (both objective and subjective assessment), few other risk factors, and identifiable protective factors, including available and accessible social support.  PLAN OF CARE: Supportive approach/coping skills                              Alcohol Dependence; detox as needed work on a relapse prevention plan                               Depression; reassess for the need to resume Celexa vs. starting another                               antidepressant.                                 CBT/mindfulness/ stress management Medical Decision Making:  Review of Psycho-Social Stressors (1), Review or order clinical lab tests (1), Review of Medication Regimen & Side Effects (2) and Review of New Medication or Change in Dosage (2)  I certify that inpatient services furnished can reasonably be expected to improve the patient's condition.   [place] A 11/23/2014, 4:05 PM

## 2014-11-23 NOTE — Progress Notes (Signed)
D: Patient denies SI/HI and A/V hallucinations; patient denies pain and reports that she rested well; patient denies withdrawal symptoms  A: Monitored q 15 minutes; patient encouraged to attend groups; patient educated about medications; patient given medications per physician orders; patient encouraged to express feelings and/or concerns  R: Patient is cooperative; patient is flat and sad; patient is minimal in her interaction with staff and peers;  patient was able to set goal to talk with staff 1:1 when having feelings of SI; patient is taking medications as prescribed and tolerating medications; patient is attending all groups

## 2014-11-23 NOTE — Progress Notes (Signed)
Pt attended NA group this evening.  

## 2014-11-23 NOTE — BHH Suicide Risk Assessment (Signed)
B[facility] INPATIENT:  Family/Significant Other Suicide Prevention Education  Suicide Prevention Education:  Education Completed; [name], Pt's son, has been identified by the patient as the family member/significant other with whom the patient will be residing, and identified as the person(s) who will aid the patient in the event of a mental health crisis (suicidal ideations/suicide attempt).  With written consent from the patient, the family member/significant other has been provided the following suicide prevention education, prior to the and/or following the discharge of the patient.  The suicide prevention education provided includes the following:  Suicide risk factors  Suicide prevention and interventions  National Suicide Hotline telephone number  [facility] assessment telephone number  [facility] Emergency Assistance [place] and/or Residential Mobile Crisis Unit telephone number  Request made of family/significant other to:  Remove weapons (e.g., guns, rifles, knives), all items previously/currently identified as safety concern.    Remove drugs/medications (over-the-counter, prescriptions, illicit drugs), all items previously/currently identified as a safety concern.  The family member/significant other verbalizes understanding of the suicide prevention education information provided.  The family member/significant other agrees to remove the items of safety concern listed above.  [name] 11/23/2014, 4:54 PM

## 2014-11-23 NOTE — Progress Notes (Signed)
Recreation Therapy Notes  Date: 11/23/14 Time: 9:30am Location: [address] Group Room  Group Topic: Stress Management  Goal Area(s) Addresses:  Patient will verbalize importance of using healthy stress management.  Patient will identify positive emotions associated with healthy stress management.   Intervention: Progressive Muscle Relaxation Script  Activity :  Patient will listen as LRT leads them through progressive muscle relaxation.  Patients will be lead through each muscle group from head to toe.  Education:  Stress Management, Discharge Planning.   Education Outcome: Acknowledges edcuation/In group clarification offered/Needs additional education  Clinical Observations/Feedback: Patient did not attend.  Victorino Sparrow, LRT/CTRS         Victorino Sparrow A 11/23/2014 1:39 PM

## 2014-11-24 NOTE — Progress Notes (Signed)
CSW is attempting to set-up [facility] for Pt at [facility] outpatient. Pt is agreeable.   Peri Maris, [place]

## 2014-11-24 NOTE — Plan of Care (Signed)
Problem: Consults Goal: Depression Patient Education See Patient Education Module for education specifics.  Outcome: Completed/Met Date Met:  11/24/14 Nurse discussed depression/coping skills with patient.

## 2014-11-24 NOTE — Progress Notes (Signed)
[facility] MD Progress Note  11/24/2014 5:44 PM [name]  MRN:  [id number] Subjective:  Liridona states she is starting to feel better. She is not wanting to go into a residential treatment program. She would rather do a CD IOP. She states that her job has been very supportive and that she hopes they agree to it. She went to the [place] but it did not work for her. She would want to be working when she does the CI IOP. Her son offered to stay with her when she gets out of the Vaughn. She hopes that the Campral helps with the cravings Principal Problem: Alcohol dependence Diagnosis:   Patient Active Problem List   Diagnosis Date Noted  . H/O suicide attempt [Z91.89] 11/21/2014  . Drug overdose, intentional [T50.902A] 11/21/2014  . Alcohol intoxication [F10.129] 11/21/2014  . Hypotension [I95.9] 11/21/2014  . 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 Overdose [T50.901A]   . Hypothyroidism [E03.9] 03/31/2014  . Depression [F32.9] 03/31/2014  . Alcohol abuse [F10.10] 12/07/2013  . Alcohol dependence [F10.20] 12/09/2011  . Substance induced mood disorder [F19.94] 12/09/2011   Total Time spent with patient: 30 minutes   Past Medical History:  Past Medical History  Diagnosis Date  . Hypertension   . Depression   . Suicidal overdose   . Alcoholism     Past Surgical History  Procedure Laterality Date  . Fracture surgery      fractured left ankle 2007  . Ganglion cyst excision      left foot   Family History: History reviewed. No pertinent family history. Social History:  History  Alcohol Use  . 7.2 oz/week  . 12 Cans of beer per week    Comment: daily 6-7 cans of beer     History  Drug Use No    History   Social History  . Marital Status: Single    Spouse Name: N/A  . Number of Children: N/A  . Years of Education: N/A   Social History Main Topics  . Smoking status: Current Every Day Smoker -- 1.00 packs/day for 5 years   Types: Cigarettes  . Smokeless tobacco: Never Used  . Alcohol Use: 7.2 oz/week    12 Cans of beer per week     Comment: daily 6-7 cans of beer  . Drug Use: No  . Sexual Activity: Yes    Birth Control/ Protection: None   Other Topics Concern  . None   Social History Narrative   Additional History:    Sleep: Fair  Appetite:  Fair   Assessment:   Musculoskeletal: Strength & Muscle Tone: within normal limits Gait & Station: normal Patient leans: N/A   Psychiatric Specialty Exam: Physical Exam  Review of Systems  Constitutional: Negative.   HENT: Negative.   Eyes: Negative.   Respiratory: Negative.   Cardiovascular: Negative.   Gastrointestinal: Negative.   Genitourinary: Negative.   Musculoskeletal: Negative.   Skin: Negative.   Endo/Heme/Allergies: Negative.   Psychiatric/Behavioral: Positive for depression and substance abuse. The patient is nervous/anxious.     Blood pressure 107/69, pulse 96, temperature 98.4 F (36.9 C), temperature source Oral, resp. rate 18, height 5\' 1"  (1.549 m), weight 68.04 kg (150 lb).Body mass index is 28.36 kg/(m^2).  General Appearance: Fairly Groomed  Engineer, water::  Fair  Speech:  Clear and Coherent  Volume:  Normal  Mood:  Anxious and worried  Affect:  Appropriate  Thought Process:  Coherent and Goal Directed  Orientation:  Full (Time, Place, and Person)  Thought Content:  symptoms events worries concerns  Suicidal Thoughts:  No  Homicidal Thoughts:  No  Memory:  Immediate;   Fair Recent;   Fair Remote;   Fair  Judgement:  Fair  Insight:  Present  Psychomotor Activity:  Normal  Concentration:  Fair  Recall:  [employer] [place]  Language: Fair  Akathisia:  No  Handed:  Right  AIMS (if indicated):     Assets:  Desire for Improvement Housing Social Support Vocational/Educational  ADL's:  Intact  Cognition: WNL  Sleep:  Number of Hours: 6.5     Current Medications: Current Facility-Administered  Medications  Medication Dose Route Frequency Provider Last Rate Last Dose  . acamprosate (CAMPRAL) tablet 666 mg  666 mg Oral TID WC Nicholaus Bloom, MD   666 mg at 11/24/14 1706  . acetaminophen (TYLENOL) tablet 650 mg  650 mg Oral Q6H PRN Kerrie Buffalo, NP      . alum & mag hydroxide-simeth (MAALOX/MYLANTA) 200-200-20 MG/5ML suspension 30 mL  30 mL Oral Q4H PRN Kerrie Buffalo, NP      . amLODipine (NORVASC) tablet 10 mg  10 mg Oral Daily Kerrie Buffalo, NP   10 mg at 11/24/14 3614  . atorvastatin (LIPITOR) tablet 20 mg  20 mg Oral q1800 Kerrie Buffalo, NP   20 mg at 11/24/14 1706  . chlordiazePOXIDE (LIBRIUM) capsule 25 mg  25 mg Oral Q6H PRN Kerrie Buffalo, NP      . Derrill Memo ON 11/25/2014] chlordiazePOXIDE (LIBRIUM) capsule 25 mg  25 mg Oral BH-qamhs Kerrie Buffalo, NP       Followed by  . [START ON 11/26/2014] chlordiazePOXIDE (LIBRIUM) capsule 25 mg  25 mg Oral Daily Kerrie Buffalo, NP      . hydrOXYzine (ATARAX/VISTARIL) tablet 25 mg  25 mg Oral Q6H PRN Kerrie Buffalo, NP   25 mg at 11/23/14 2128  . loperamide (IMODIUM) capsule 2-4 mg  2-4 mg Oral PRN Kerrie Buffalo, NP      . magnesium hydroxide (MILK OF MAGNESIA) suspension 30 mL  30 mL Oral Daily PRN Kerrie Buffalo, NP      . multivitamin with minerals tablet 1 tablet  1 tablet Oral Daily Kerrie Buffalo, NP   1 tablet at 11/24/14 4315  . nicotine (NICODERM CQ - dosed in mg/24 hours) patch 21 mg  21 mg Transdermal Daily Nicholaus Bloom, MD   21 mg at 11/24/14 4008  . ondansetron (ZOFRAN-ODT) disintegrating tablet 4 mg  4 mg Oral Q6H PRN Kerrie Buffalo, NP      . pneumococcal 23 valent vaccine (PNU-IMMUNE) injection 0.5 mL  0.5 mL Intramuscular Tomorrow-1000 Nicholaus Bloom, MD   0.5 mL at 11/24/14 1107  . thiamine (B-1) injection 100 mg  100 mg Intramuscular Once Kerrie Buffalo, NP   100 mg at 11/22/14 1807  . thiamine (VITAMIN B-1) tablet 100 mg  100 mg Oral Daily Kerrie Buffalo, NP   100 mg at 11/24/14 6761    Lab Results: No results found  for this or any previous visit (from the past 48 hour(s)).  Physical Findings: AIMS: Facial and Oral Movements Muscles of Facial Expression: None, normal Lips and Perioral Area: None, normal Jaw: None, normal Tongue: None, normal,Extremity Movements Upper (arms, wrists, hands, fingers): None, normal Lower (legs, knees, ankles, toes): None, normal, Trunk Movements Neck, shoulders, hips: None, normal, Overall Severity Severity of abnormal movements (highest score from questions above): None, normal Incapacitation due  to abnormal movements: None, normal Patient's awareness of abnormal movements (rate only patient's report): No Awareness, Dental Status Current problems with teeth and/or dentures?: No Does patient usually wear dentures?: No  CIWA:  CIWA-Ar Total: 1 COWS:  COWS Total Score: 1  Treatment Plan Summary: Daily contact with patient to assess and evaluate symptoms and progress in treatment and Medication management Supportive approach/coping skills Alcohol dependence; continue detox protocol/work a relapse prevention plan Alcohol cravings; continue the Campral Continue to evaluate for  the need for an antidepressant CBT/mindfulness Medical Decision Making:  Review of Psycho-Social Stressors (1), Review of Medication Regimen & Side Effects (2) and Review of New Medication or Change in Dosage (2)     Basilio Meadow A 11/24/2014, 5:44 PM

## 2014-11-24 NOTE — Progress Notes (Addendum)
D:  Patient's self inventory sheet, patient has fair sleep, sleep medication is helpful.  Good appetite, normal energy level, good concentration.  Rated depression and anxiety 4, denied hopeless.  Denied withdrawals.  Denied SI.  Denied pain.  Goal is to make a plan and get well.  Plans to stay focused.  Does have discharge plan. A:  Medications administered per MD orders.  Emotional support and encouragement given patient. R:  Denied SI and HI.  Denied A/V hallucinations.  Safety maintained with 15 minute checks. Patient may discharge to [facility] or may go to [place].

## 2014-11-24 NOTE — Progress Notes (Signed)
D: Pt has depressed affect and mood.  Pt reports her goal today was "getting a plan together, supposed to find out tomorrow with the outpatient program that [facility] offers here, so we're waiting on that."  Pt reports she is supposed to discharge Saturday.  She reports she feels safe to discharge, reports "my son is at my house and my other son is right around the corner."  Pt denies SI/HI, denies hallucinations, denies pain, denies withdrawal symptoms.  Pt has been visible in milieu interacting with peers and staff appropriately.  Pt did not attend evening group.   A:  Met with pt 1:1 and provided support and encouragement.  Actively listened to pt.   R: Pt verbally contracts for safety and reports that she will notify staff of needs and concerns.  Will continue to monitor and assess.

## 2014-11-24 NOTE — Plan of Care (Signed)
Problem: Alteration in mood & ability to function due to Goal: LTG-Pt reports reduction in suicidal thoughts (Patient reports reduction in suicidal thoughts and is able to verbalize a safety plan for whenever patient is feeling suicidal)  Outcome: Progressing Pt denied SI this shift.  She verbally contracted for safety.   Goal: STG-Patient will attend groups Outcome: Progressing Pt attended evening group on 11/23/14. Goal: STG-Patient will comply with prescribed medication regimen (Patient will comply with prescribed medication regimen)  Outcome: Progressing Pt has been compliant with all medications this shift.

## 2014-11-24 NOTE — BHH Group Notes (Signed)
The focus of this group is to educate the patient on the purpose and policies of crisis stabilization and provide a format to answer questions about their admission.  The group details unit policies and expectations of patients while admitted.  Patient attended 0900 nurse education orientation group this morning.  Patient actively participated, appropriate affect, alert, appropriate insight and engagement.  Today patient will work on 3 goals for discharge.  

## 2014-11-24 NOTE — BHH Group Notes (Signed)
[place] LCSW Group Therapy 11/24/2014 1:15pm  Type of Therapy: Group Therapy- Balance in Life  Participation Level: Active   Description of the Group:  The topic for group was balance in life. Today's group focused on defining balance in one's own words, identifying things that can knock one off balance, and exploring healthy ways to maintain balance in life. Group members were asked to provide an example of a time when they felt off balance, describe how they handled that situation,and process healthier ways to regain balance in the future. Group members were asked to share the most important tool for maintaining balance that they learned while at [facility] and how they plan to apply this method after discharge.  Summary of Patient Progress Pt continues to participate appropriately in group, requiring less prompting. Pt insight is increasing AEB her self-initiated identification of needed changes that need to be made and her ability to verbalize changes in her lifestyle that will be helpful in her recovery process. Pt expressed that having a daily structure and planned activities will assist in helping her maintain sobriety as well as more frequent involvement in AA. Pt also discussed her recognition that her reluctance to reach out for help has caused her life to remain unbalanced and has fed her addiction. Pt reports being motivated to regain balance by working toward sobriety.     Therapeutic Modalities:   Cognitive Behavioral Therapy Solution-Focused Therapy Assertiveness Training   Peri Maris, Latanya Presser 11/24/2014 4:48 PM

## 2014-11-24 NOTE — Progress Notes (Signed)
Recreation Therapy Notes  Animal-Assisted Activity (AAA) Program Checklist/Progress Notes Patient Eligibility Criteria Checklist & Daily Group note for Rec Tx Intervention  Date: 05.26.16 Time: 2:45pm Location: [address] [employer]   AAA/T Program Assumption of Risk Form signed by Patient/ or Parent Legal Guardian yes  Patient is free of allergies or sever asthma yes  Patient reports no fear of animals yes  Patient reports no history of cruelty to animalsyes  Patient understands his/her participation is voluntary yes  Patient washes hands before animal contact yes  Patient washes hands after animal contact yes  Education: Hand Washing, Appropriate Animal Interaction   Education Outcome: Acknowledges understanding/In group clarification offered/Needs additional education.   Clinical Observations/Feedback: Patient did not attend group.  Victorino Sparrow, LRT/CTRS         Victorino Sparrow A 11/24/2014 4:08 PM

## 2014-11-24 NOTE — Progress Notes (Signed)
Pt did not attend karaoke group this evening.  

## 2014-11-24 NOTE — Progress Notes (Signed)
D: Pt has anxious affect and anxious, depressed mood.  Pt reports her day has been "good, productive."  Pt reports she "met with my counselor and the doctor and we're trying to me a good plan going."  Pt reports she is "a little anxious, a little depressed."  Pt denies SI/HI, denies hallucinations, denies pain.  Pt has been visible in milieu interacting with peers and staff appropriately.  Pt attended evening group.   A: Introduced self to pt.  Met with pt 1:1 and provided support and encouragement.  Actively listened to pt.  Medications administered per order.  PRN medication administered for anxiety. R: Pt is compliant with medications.  Pt verbally contracts for safety.  Will continue to monitor and assess.

## 2014-11-25 MED ORDER — HYDROXYZINE HCL 25 MG PO TABS
25.0000 mg | ORAL_TABLET | Freq: Three times a day (TID) | ORAL | Status: DC | PRN
  Filled 2014-11-25: qty 6

## 2014-11-25 NOTE — Plan of Care (Signed)
Problem: Alteration in mood Goal: LTG-Patient reports reduction in suicidal thoughts (Patient reports reduction in suicidal thoughts and is able to verbalize a safety plan for whenever patient is feeling suicidal)  Outcome: Progressing Pt denied SI this shift and verbally contracted for safety.

## 2014-11-25 NOTE — Progress Notes (Signed)
Pt attended AA group 

## 2014-11-25 NOTE — BHH Group Notes (Signed)
[place] LCSW Group Therapy 11/25/2014 1:15pm  Type of Therapy: Group Therapy- Feelings Around Relapse and Recovery  Participation Level: Active   Participation Quality:  Appropriate  Affect:  Appropriate   Cognitive: Alert and Oriented   Insight:  Developing/Improving   Engagement in Therapy: Developing/Improving and Engaged   Modes of Intervention: Clarification, Confrontation, Discussion, Education, Exploration, Limit-setting, Orientation, Problem-solving, Rapport Building, Art therapist, Socialization and Support  Summary of Progress/Problems: The topic for today was feelings about relapse. Pt discussed what relapse prevention is to them and identified triggers that they are on the path to relapse. Pt processed their feeling towards relapse and was able to relate to peers. Pt discussed coping skills that can be used for relapse prevention. Pt participated appropriately in group discussion and was able to process feelings related to her latest relapse. She reported feelings of shame and described feeling like a failure because she had let down people that cared about her. Pt identified salient risk factors for relapse including her reluctance to reach out for help. Pt expressed the importance of creating structure and planning her recovery at discharge. Pt motivation is strong AEB her consistent ability to identify warning signs, risk factors, and necessary steps to achieving sobriety. Pt offered encouragement to other peers and was receptive to advice from others.    Therapeutic Modalities:   Cognitive Behavioral Therapy Solution-Focused Therapy Assertiveness Training Relapse Prevention Therapy    Norman Clay [phone] 11/25/2014 2:59 PM

## 2014-11-25 NOTE — Progress Notes (Signed)
[facility] Adult Case Management Discharge Plan :  Will you be returning to the same living situation after discharge: Yes, Pt will be returning home At discharge, do you have transportation home?: Yes, family will provide transportation Do you have the ability to pay for your medications: Yes, Pt provided with prescriptions  Release of information consent forms completed and in the chart; Patient's signature needed at discharge.  Patient to Follow up at: Follow-up Information    Follow up with [facility].   Why: Webb Silversmith from the [facility] program will contact you with appointment times. If you do not hear from her by the end of next week, please call her.   Contact information:   [address] [place] [address] [phone]      Patient denies SI/HI: Yes, Pt denies   Safety Planning and Suicide Prevention discussed: Yes, with son. See SPE note for further details.  Have you used any form of tobacco in the last 30 days? (Cigarettes, Smokeless Tobacco, Cigars, and/or Pipes): No  Has patient been referred to the Quitline?: Patient refused referral  Bo Mcclintock 11/25/2014, 11:56 AM

## 2014-11-25 NOTE — Progress Notes (Signed)
D:  Per pt self inventory pt reports sleeping fair, appetite good, energy level normal, ability to pay attention good, rates depression at a 0 out of 10, hopelessness at a 0 out of 10, anxiety at a 2 out of 10, denies SI/HI/AVH, goal today: "Getting better-no cravings"--"Focus on on family that I have hurt", pt resting in bed this afternoon, depressed affect.    A:  Emotional support provided, Encouraged pt to continue with treatment plan and attend all group activities, q15 min checks maintained for safety.  R:  Pt is receptive, going to groups, pleasant during interaction, cooperative with staff and other patients on the unit.

## 2014-11-25 NOTE — Progress Notes (Signed)
D.  Pt pleasant on approach, denies complaints at this time.  Positive for evening AA group, interacting appropriately with peers on the unit.  Denies SI/HI/hallucinations at this time.  A.  Support and encouragement offered  R.  Pt remains safe on the unit, will continue to monitor.

## 2014-11-25 NOTE — Progress Notes (Signed)
Recreation Therapy Notes  Date: 05.27.16 Time: 9:30am Location: 300 Group Room  Group Topic: Stress Management  Goal Area(s) Addresses:  Patient will verbalize importance of using healthy stress management.  Patient will identify positive emotions associated with healthy stress management.   Intervention: Progressive Muscle Relaxation, Guided Imagery Script  Activity :  Progressive Muscle Relaxation & Guided Imagery.  LRT introduced and educated patients on stress management  Techniques of progressive muscle relaxation and guided imagery.  Scripts were used to deliver both techniques to patients, patients were asked to follow the script read allowed by LRT to engage in practicing both stress management techniques.  Education:  Stress Management, Discharge Planning.   Education Outcome: Acknowledges edcuation/In group clarification offered/Needs additional education  Clinical Observations/Feedback: Patient did not attend group.  Tasheem Elms Cammy Brochure, LRT/CTRS         Victorino Sparrow A 11/25/2014 4:06 PM

## 2014-11-25 NOTE — Tx Team (Signed)
Interdisciplinary Treatment Plan Update (Adult) Date: 11/25/2014   Date: 11/25/2014 8:43 AM  Progress in Treatment:  Attending groups: Yes  Participating in groups: Yes  Taking medication as prescribed: Yes  Tolerating medication: Yes  Family/Significant othe contact made: Yes, CSW has spoken with [name]'s son Patient understands diagnosis: Yes Discussing patient identified problems/goals with staff: Yes  Medical problems stabilized or resolved: Yes  Denies suicidal/homicidal ideation: Yes Patient has not harmed self or Others: Yes   New problem(s) identified:   Discharge Plan or Barriers: 11/23/14: [name] is considering different outpatient treatment options, particularly [facility] which is currently operating on a 2 week waiting list. [name] is unsure if she will be able to do a 6-8 week program due to her employment. CSW will continue to assess.  11/25/2014: [name] has decided to participate in [place] program at [facility]. She will return to her home at discharge and go AA meetings in the area. The [facility] program is currently operating on a waitlist; [name] is aware and willing to wait to participate in the program.   Additional comments:  Patient is a 59 year old Caucasian female with a diagnosis of Alcohol Use Disorder, severe. [name] was admitted from ICU after an overdose on Celexa. [name] reports that she feels this was a cry for help and a way to get treatment. She reports being motivated for outpatient treatment and is determined to have a plan in place for discharge. She identifies her mother, sons, and siblings as being supportive. She has worked at her place of employment for 26 years and feels that if she does not find a successful treatment arginine and follow through with it that she will lose her job. [name] denies SI/HI at time of assessment. [name] presented as pleasant and cooperative, alert and oriented x4.  Reason for Continuation of Hospitalization:  Anxiety Depression Medication  stabilization Suicidal ideation  Estimated length of stay: 1-2 days  For review of initial/current patient goals, please see plan of care.   Attendees:  Patient:    Family:    Physician: Dr. Sabra Heck MD  11/25/2014 8:37 AM  Nursing: Lars Pinks, RN Case manager  11/25/2014 8:37 AM  Clinical Social Worker Norman Clay, MSW 11/25/2014 8:37 AM  Clinical: Langston Masker, RN  11/25/2014 8:37 AM  Other:  RN 11/25/2014 8:37 AM  Other: , RN Charge Nurse 11/25/2014 8:37 AM  Other:      Peri Maris, Latanya Presser MSW

## 2014-11-25 NOTE — Progress Notes (Signed)
[facility] MD Progress Note  11/25/2014 5:43 PM [name]  MRN:  [id number] Subjective:  Anticipating being D/C tomorrow. She is committed to abstinence but she is worried about her ability to stay sober. She will returning back to work Tuesday. Her son will be staying with her. She plans to go to [place] as well as celebrate recovery. She is planning to come to the [facility] IOP at [facility] and continue to use the Campral. She is also going to activated her membership at a local [employer].  Principal Problem: Alcohol dependence Diagnosis:   Patient Active Problem List   Diagnosis Date Noted  . H/O suicide attempt [Z91.89] 11/21/2014  . Drug overdose, intentional [T50.902A] 11/21/2014  . Alcohol intoxication [F10.129] 11/21/2014  . Hypotension [I95.9] 11/21/2014  . 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 Overdose [T50.901A]   . Hypothyroidism [E03.9] 03/31/2014  . Depression [F32.9] 03/31/2014  . Alcohol abuse [F10.10] 12/07/2013  . Alcohol dependence [F10.20] 12/09/2011  . Substance induced mood disorder [F19.94] 12/09/2011   Total Time spent with patient: 30 minutes   Past Medical History:  Past Medical History  Diagnosis Date  . Hypertension   . Depression   . Suicidal overdose   . Alcoholism     Past Surgical History  Procedure Laterality Date  . Fracture surgery      fractured left ankle 2007  . Ganglion cyst excision      left foot   Family History: History reviewed. No pertinent family history. Social History:  History  Alcohol Use  . 7.2 oz/week  . 12 Cans of beer per week    Comment: daily 6-7 cans of beer     History  Drug Use No    History   Social History  . Marital Status: Single    Spouse Name: N/A  . Number of Children: N/A  . Years of Education: N/A   Social History Main Topics  . Smoking status: Current Every Day Smoker -- 1.00 packs/day for 5 years    Types: Cigarettes  . Smokeless tobacco: Never Used  . Alcohol Use: 7.2  oz/week    12 Cans of beer per week     Comment: daily 6-7 cans of beer  . Drug Use: No  . Sexual Activity: Yes    Birth Control/ Protection: None   Other Topics Concern  . None   Social History Narrative   Additional History:    Sleep: Fair  Appetite:  Fair   Assessment:   Musculoskeletal: Strength & Muscle Tone: within normal limits Gait & Station: normal Patient leans: N/A   Psychiatric Specialty Exam: Physical Exam  Review of Systems  Constitutional: Negative.   HENT: Negative.   Eyes: Negative.   Respiratory: Negative.   Cardiovascular: Negative.   Gastrointestinal: Negative.   Genitourinary: Negative.   Musculoskeletal: Negative.   Skin: Negative.   Neurological: Negative.   Endo/Heme/Allergies: Negative.   Psychiatric/Behavioral: Positive for depression and substance abuse.    Blood pressure 112/79, pulse 83, temperature 97.8 F (36.6 C), temperature source Oral, resp. rate 16, height 5\' 1"  (1.549 m), weight 68.04 kg (150 lb).Body mass index is 28.36 kg/(m^2).  General Appearance: Fairly Groomed  Engineer, water::  Fair  Speech:  Clear and Coherent  Volume:  Normal  Mood:  Anxious and worried  Affect:  worried  Thought Process:  Coherent and Goal Directed  Orientation:  Full (Time, Place, and Person)  Thought Content:  events worries concerns  Suicidal  Thoughts:  No  Homicidal Thoughts:  No  Memory:  Immediate;   Fair Recent;   Fair Remote;   Fair  Judgement:  Fair  Insight:  Present  Psychomotor Activity:  Normal  Concentration:  Fair  Recall:  [employer] [place]  Language: Fair  Akathisia:  No  Handed:  Right  AIMS (if indicated):     Assets:  Desire for Improvement Housing Social Support Vocational/Educational  ADL's:  Intact  Cognition: WNL  Sleep:  Number of Hours: 6.5     Current Medications: Current Facility-Administered Medications  Medication Dose Route Frequency Provider Last Rate Last Dose  . acamprosate  (CAMPRAL) tablet 666 mg  666 mg Oral TID WC Nicholaus Bloom, MD   666 mg at 11/25/14 1721  . acetaminophen (TYLENOL) tablet 650 mg  650 mg Oral Q6H PRN Kerrie Buffalo, NP      . alum & mag hydroxide-simeth (MAALOX/MYLANTA) 200-200-20 MG/5ML suspension 30 mL  30 mL Oral Q4H PRN Kerrie Buffalo, NP      . amLODipine (NORVASC) tablet 10 mg  10 mg Oral Daily Kerrie Buffalo, NP   10 mg at 11/25/14 0847  . atorvastatin (LIPITOR) tablet 20 mg  20 mg Oral q1800 Kerrie Buffalo, NP   20 mg at 11/25/14 1721  . chlordiazePOXIDE (LIBRIUM) capsule 25 mg  25 mg Oral Q6H PRN Kerrie Buffalo, NP      . chlordiazePOXIDE (LIBRIUM) capsule 25 mg  25 mg Oral BH-qamhs Kerrie Buffalo, NP   25 mg at 11/25/14 0848   Followed by  . [START ON 11/26/2014] chlordiazePOXIDE (LIBRIUM) capsule 25 mg  25 mg Oral Daily Kerrie Buffalo, NP      . hydrOXYzine (ATARAX/VISTARIL) tablet 25 mg  25 mg Oral Q6H PRN Kerrie Buffalo, NP   25 mg at 11/23/14 2128  . loperamide (IMODIUM) capsule 2-4 mg  2-4 mg Oral PRN Kerrie Buffalo, NP      . magnesium hydroxide (MILK OF MAGNESIA) suspension 30 mL  30 mL Oral Daily PRN Kerrie Buffalo, NP      . multivitamin with minerals tablet 1 tablet  1 tablet Oral Daily Kerrie Buffalo, NP   1 tablet at 11/25/14 0847  . nicotine (NICODERM CQ - dosed in mg/24 hours) patch 21 mg  21 mg Transdermal Daily Nicholaus Bloom, MD   21 mg at 11/25/14 0847  . ondansetron (ZOFRAN-ODT) disintegrating tablet 4 mg  4 mg Oral Q6H PRN Kerrie Buffalo, NP      . pneumococcal 23 valent vaccine (PNU-IMMUNE) injection 0.5 mL  0.5 mL Intramuscular Tomorrow-1000 Nicholaus Bloom, MD   0.5 mL at 11/24/14 1107  . thiamine (B-1) injection 100 mg  100 mg Intramuscular Once Kerrie Buffalo, NP   100 mg at 11/22/14 1807  . thiamine (VITAMIN B-1) tablet 100 mg  100 mg Oral Daily Kerrie Buffalo, NP   100 mg at 11/25/14 0626    Lab Results: No results found for this or any previous visit (from the past 48 hour(s)).  Physical Findings: AIMS:  Facial and Oral Movements Muscles of Facial Expression: None, normal Lips and Perioral Area: None, normal Jaw: None, normal Tongue: None, normal,Extremity Movements Upper (arms, wrists, hands, fingers): None, normal Lower (legs, knees, ankles, toes): None, normal, Trunk Movements Neck, shoulders, hips: None, normal, Overall Severity Severity of abnormal movements (highest score from questions above): None, normal Incapacitation due to abnormal movements: None, normal Patient's awareness of abnormal movements (rate only patient's report): No Awareness, Dental  Status Current problems with teeth and/or dentures?: No Does patient usually wear dentures?: No  CIWA:  CIWA-Ar Total: 1 COWS:  COWS Total Score: 1  Treatment Plan Summary: Daily contact with patient to assess and evaluate symptoms and progress in treatment and Medication management Supportive approach/coping skills Alcohol dependence; complete the detox protocol and continue to work a relapse prevention plan Depression; continue to work on identifying life style changes that can help improve mood as behavioral activation Use CBT/mindfulness  Medical Decision Making:  Review of Psycho-Social Stressors (1), Review of Medication Regimen & Side Effects (2) and Review of New Medication or Change in Dosage (2)     [name] A 11/25/2014, 5:43 PM

## 2014-11-25 NOTE — BHH Group Notes (Signed)
[facility] LCSW Aftercare Discharge Planning Group Note  11/25/2014 8:45 AM  Participation Quality: Alert, Appropriate and Oriented  Mood/Affect: Appropriate  Depression Rating: 2  Anxiety Rating: 2  Thoughts of Suicide: Pt denies SI/HI  Will you contract for safety? Yes  Current AVH: Pt denies  Plan for Discharge/Comments: Pt attended discharge planning group and actively participated in group. CSW provided pt with today's workbook. Pt participated appropriately in group and is able to verbalize adequate discharge plan. Discussed importance of having concrete plan at discharge in order to maintain sobriety.  Transportation Means: Pt reports access to transportation  Supports: Pt identifies family as very supportive  Peri Maris, [place] 11/25/2014 9:53 AM

## 2014-11-26 ENCOUNTER — Encounter (HOSPITAL_COMMUNITY): Payer: Self-pay | Admitting: Registered Nurse

## 2014-11-26 DIAGNOSIS — F332 Major depressive disorder, recurrent severe without psychotic features: Secondary | ICD-10-CM | POA: Insufficient documentation

## 2014-11-26 DIAGNOSIS — F10230 Alcohol dependence with withdrawal, uncomplicated: Secondary | ICD-10-CM | POA: Insufficient documentation

## 2014-11-26 MED ORDER — ADULT MULTIVITAMIN W/MINERALS CH: 1.0000 | ORAL_TABLET | Freq: Every day | ORAL | Status: AC

## 2014-11-26 MED ORDER — HYDROXYZINE HCL 25 MG PO TABS: 25.0000 mg | ORAL_TABLET | Freq: Three times a day (TID) | ORAL | Status: DC | PRN

## 2014-11-26 MED ORDER — ATORVASTATIN CALCIUM 20 MG PO TABS: 20.0000 mg | ORAL_TABLET | Freq: Every day | ORAL | Status: DC

## 2014-11-26 MED ORDER — AMLODIPINE BESYLATE 10 MG PO TABS: 10.0000 mg | ORAL_TABLET | Freq: Every day | ORAL | Status: AC

## 2014-11-26 MED ORDER — ACAMPROSATE CALCIUM 333 MG PO TBEC: 666.0000 mg | DELAYED_RELEASE_TABLET | Freq: Three times a day (TID) | ORAL | Status: DC

## 2014-11-26 NOTE — BHH Suicide Risk Assessment (Signed)
[name]   Demographic Factors:  Caucasian  Total Time spent with patient: 30 minutes  Musculoskeletal: Strength & Muscle Tone: within normal limits Gait & Station: normal Patient leans: N/A  Psychiatric Specialty Exam: Physical Exam  Review of Systems  Constitutional: Negative.   HENT: Negative.   Eyes: Negative.   Respiratory: Negative.   Cardiovascular: Negative.   Gastrointestinal: Negative.   Genitourinary: Negative.   Musculoskeletal: Negative.   Skin: Negative.   Neurological: Negative.   Endo/Heme/Allergies: Negative.   Psychiatric/Behavioral: Positive for depression and substance abuse. The patient is nervous/anxious.     Blood pressure 110/79, pulse 84, temperature 97.7 F (36.5 C), temperature source Oral, resp. rate 16, height 5\' 1"  (1.549 m), weight 68.04 kg (150 lb).Body mass index is 28.36 kg/(m^2).  General Appearance: Fairly Groomed  Engineer, water::  Fair  Speech:  Clear and [id number]  Volume:  Normal  Mood:  Euthymic  Affect:  Appropriate  Thought Process:  Coherent and Goal Directed  Orientation:  Full (Time, Place, and Person)  Thought Content:  plans as she moves on, relapse prevention plan  Suicidal Thoughts:  No  Homicidal Thoughts:  No  Memory:  Immediate;   Fair Recent;   Fair Remote;   Fair  Judgement:  Fair  Insight:  Present  Psychomotor Activity:  Normal  Concentration:  Fair  Recall:  [name]  Language: Fair  Akathisia:  No  Handed:  Right  AIMS (if indicated):     Assets:  [employer]ire for Improvement Housing Social Support  Sleep:  Number of Hours: 6.75  Cognition: WNL  ADL's:  Intact   Have you used any form of tobacco in the last 30 days? (Cigarettes, Smokeless Tobacco, Cigars, and/or Pipes): Yes  Has this patient used any form of tobacco in the last 30 days? (Cigarettes, Smokeless Tobacco, Cigars, and/or Pipes) Yes, Prescription not provided because: will use nicotine  patches  Mental Status Per Nursing Assessment::   On Admission:  Suicidal ideation indicated by others, Self-harm behaviors  Current Mental Status by Physician: IN full contact with reality. There are no active S/S of withdrawal. There are no active SI plans or intent. She is planning to work towards long term abstinence. She states losing her dad was the main trigger for her relapse. States she has been able to process it and for the first time went to the grave site. The other trigger was stress from work. She states she is going to have a lot of responsibilities from her. She will come to the [facility] IOP and go to AA   Loss Factors: Loss of significant relationship  Historical Factors: NA  Risk Reduction Factors:   Sense of responsibility to family, Employed and Positive social support  Continued Clinical Symptoms:  Depression:   Comorbid alcohol abuse/dependence Alcohol/Substance Abuse/Dependencies  Cognitive Features That Contribute To Risk:  None    Suicide Risk:  Minimal: No identifiable suicidal ideation.  Patients presenting with no risk factors but with morbid ruminations; may be classified as minimal risk based on the severity of the depressive symptoms  Principal Problem: Alcohol dependence Discharge Diagnoses:  Patient Active Problem List   Diagnosis Date Noted  . H/O suicide attempt [Z91.89] 11/21/2014  . Drug overdose, intentional [T50.902A] 11/21/2014  . Alcohol intoxication [F10.129] 11/21/2014  . Hypotension [I95.9] 11/21/2014  . 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 Overdose [T50.901A]   . Hypothyroidism [E03.9] 03/31/2014  . Depression [  F32.9] 03/31/2014  . Alcohol abuse [F10.10] 12/07/2013  . Alcohol dependence [F10.20] 12/09/2011  . Substance induced mood disorder [F19.94] 12/09/2011    Follow-up Information    Follow up with [facility] On 12/13/2014.   Why:  at 10:00am with Susa Loffler. She will contact you this coming week as well.    Contact information:   [address] [place] [address]      Plan Of Care/Follow-up recommendations:  Activity:  as tolerated Diet:  regular Follow up outpatient basis[facility] above/[facility] Is patient on multiple antipsychotic therapies at discharge:  No   Has Patient had three or more failed trials of antipsychotic monotherapy by history:  No  Recommended Plan for Multiple Antipsychotic Therapies: NA    [name] A 11/26/2014, 9:00 AM

## 2014-11-26 NOTE — Progress Notes (Signed)
DISCHARGE NOTE: D: Patient was alert, oriented, in stable condition, and ambulatory with a steady gait upon discharge. Pt denies SI/HI and AVH. A: AVS reviewed and given to pt. Follow up reviewed with pt. Resources reviewed with pt, including [facility]. Prescriptions/Medications given to pt. Belongings returned to pt. Pt given time to ask questions and express concerns. Pt encouraged to follow up with PCP with any additional medical concerns. R: Pt D/C'd to "son."

## 2014-11-26 NOTE — Discharge Summary (Signed)
Physician Discharge Summary Note  [name] is an 59 y.o., female MRN:  [id number] DOB:  July 22, 1955 Patient phone:  [phone] (home)  Patient address:   [place] [address],  Total Time spent with patient: greater than 30 minutes  Date of Admission:  11/22/2014 Date of Discharge: 11/26/2014  Reason for Admission:  Per H&P Admission:  [name] is a 59 years old female admitted to [facility] long intensive care unit for alcohol intoxication, status post intentional overdose of antidepressant medication Celexa. Patient endorses symptoms of depression, stress from work, tearful, disturbance of sleep and appetite and increased alcohol intake over several months. Patient had has at least 2 previous acute psychiatric hospitalization for depression and alcohol abuse versus dependence. Patient stated she cannot make up our mind if she can voluntarily admit herself to the behavioral [facility] because she is concerned about losing her job and at the same time reportedly extremely stressed about her job. Patient stated she is going to talk to her family members and mother regarding possible voluntary psychiatric hospitalization. Patient minimizes her intention overdose of Celexa as a suicide attempt but at the same time she stated that she does not care if she woke up after taking the pills while intoxicated. Patient stated that she called herself for help after taking the medication and received charcoal. Patient blood alcohol level is 162 and her urine drug screen is negative for drug of abuse. Patient liver function tests are within normal limits. Patient denies previous history of alcohol detox treatment and rehabilitation treatment Elements: Location: alcohol dependence, major depression.  Principal Problem: Alcohol dependence Discharge Diagnoses: Patient Active Problem List   Diagnosis Date Noted  . Alcohol dependence with uncomplicated withdrawal [W46.659]   .  Major depressive disorder, recurrent, severe without psychotic features [F33.2]   . H/O suicide attempt [Z91.89] 11/21/2014  . Drug overdose, intentional [T50.902A] 11/21/2014  . Alcohol intoxication [F10.129] 11/21/2014  . Hypotension [I95.9] 11/21/2014  . 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 Overdose [T50.901A]   . Hypothyroidism [E03.9] 03/31/2014  . Depression [F32.9] 03/31/2014  . Alcohol abuse [F10.10] 12/07/2013  . Alcohol dependence [F10.20] 12/09/2011  . Substance induced mood disorder [F19.94] 12/09/2011    Musculoskeletal: Strength & Muscle Tone: within normal limits Gait & Station: normal Patient leans: N/A  Psychiatric Specialty Exam:  See Suicide Risk Assessment Physical Exam  Nursing note and vitals reviewed. Constitutional: She is oriented to person, place, and time.  Neck: Normal range of motion.  Respiratory: Effort normal.  Musculoskeletal: Normal range of motion.  Neurological: She is alert and oriented to person, place, and time.    Review of Systems  Cardiovascular:       HTN  Psychiatric/Behavioral: Positive for substance abuse. Negative for suicidal ideas, hallucinations and memory loss. Depression: Stable. Nervous/anxious: Stable. Insomnia: Stable.   All other systems reviewed and are negative.   Blood pressure 110/79, pulse 84, temperature 97.7 F (36.5 C), temperature source Oral, resp. rate 16, height 5\' 1"  (1.549 m), weight 68.04 kg (150 lb).Body mass index is 28.36 kg/(m^2).  Have you used any form of tobacco in the last 30 days? (Cigarettes, Smokeless Tobacco, Cigars, and/or Pipes): Yes  Has this patient used any form of tobacco in the last 30 days? (Cigarettes, Smokeless Tobacco, Cigars, and/or Pipes) Yes, A prescription for an FDA-approved tobacco cessation medication was offered at discharge and the patient refused  Past Medical History:  Past Medical History  Diagnosis  Date  . Hypertension   .  Depression   . Suicidal overdose   . Alcoholism     Past Surgical History  Procedure Laterality Date  . Fracture surgery      fractured left ankle 2007  . Ganglion cyst excision      left foot   Family History: History reviewed. No pertinent family history. Social History:  History  Alcohol Use  . 7.2 oz/week  . 12 Cans of beer per week    Comment: daily 6-7 cans of beer     History  Drug Use No    History   Social History  . Marital Status: Single    Spouse Name: N/A  . Number of Children: N/A  . Years of Education: N/A   Social History Main Topics  . Smoking status: Current Every Day Smoker -- 1.00 packs/day for 5 years    Types: Cigarettes  . Smokeless tobacco: Never Used  . Alcohol Use: 7.2 oz/week    12 Cans of beer per week     Comment: daily 6-7 cans of beer  . Drug Use: No  . Sexual Activity: Yes    Birth Control/ Protection: None   Other Topics Concern  . None   Social History Narrative    Past Psychiatric History: Hospitalizations:  Outpatient Care:  Substance Abuse Care:  Self-Mutilation:  Suicidal Attempts:  Violent Behaviors:   Risk to Self: Is patient at risk for suicide?: No What has been your use of drugs/alcohol within the last 12 months?: daily use, after work Pt drinks 6-7 beers during the week; Pt binges on the weeked, using 8-15 beers (regular cans) Risk to Others:   Prior Inpatient Therapy:   Prior Outpatient Therapy:    Level of Care:  OP  Hospital Course:  [name] was admitted for Alcohol dependence and crisis management.  She was treated discharged with the medications listed below under Medication List.  Medical problems were identified and treated as needed.  Home medications were restarted as appropriate.  Improvement was monitored by observation and [name] daily report of symptom reduction.  Emotional and mental status was monitored by daily self-inventory reports completed by [name] and clinical  staff.         [name] was evaluated by the treatment team for stability and plans for continued recovery upon discharge.  [name] motivation was an integral factor for scheduling further treatment.  Employment, transportation, bed availability, health status, family support, and any pending legal issues were also considered during her hospital stay.  She was offered further treatment options upon discharge including but not limited to Residential, Intensive Outpatient, and Outpatient treatment.  [name] will follow up with the services as listed below under Follow Up Information.     Upon completion of this admission the patient was both mentally and medically stable for discharge denying suicidal/homicidal ideation, auditory/visual/tactile hallucinations, delusional thoughts and paranoia.      Consults:  psychiatry  Significant Diagnostic Studies:  labs: TSH, UDS, HgbA1c, Pregnancy, BMP, ETOH, Magnesium, MSRA PCR screening  Discharge Vitals:   Blood pressure 110/79, pulse 84, temperature 97.7 F (36.5 C), temperature source Oral, resp. rate 16, height 5\' 1"  (1.549 m), weight 68.04 kg (150 lb). Body mass index is 28.36 kg/(m^2). Lab Results:   No results found for this or any previous visit (from the past 72 hour(s)).  Physical Findings: AIMS: Facial and Oral Movements Muscles of Facial  Expression: None, normal Lips and Perioral Area: None, normal Jaw: None, normal Tongue: None, normal,Extremity Movements Upper (arms, wrists, hands, fingers): None, normal Lower (legs, knees, ankles, toes): None, normal, Trunk Movements Neck, shoulders, hips: None, normal, Overall Severity Severity of abnormal movements (highest score from questions above): None, normal Incapacitation due to abnormal movements: None, normal Patient's awareness of abnormal movements (rate only patient's report): No Awareness, Dental Status Current problems with teeth and/or dentures?: No Does  patient usually wear dentures?: No  CIWA:  CIWA-Ar Total: 1 COWS:  COWS Total Score: 1   See Psychiatric Specialty Exam and Suicide Risk Assessment completed by Attending Physician prior to discharge.  Discharge destination:  Home  Is patient on multiple antipsychotic therapies at discharge:  No   Has Patient had three or more failed trials of antipsychotic monotherapy by history:  No    Recommended Plan for Multiple Antipsychotic Therapies: NA      Discharge Instructions    Activity as tolerated - No restrictions    Complete by:  As directed      Diet - low sodium heart healthy    Complete by:  As directed      Discharge instructions    Complete by:  As directed   Take all of you medications as prescribed by your mental healthcare provider.  Report any adverse effects and reactions from your medications to your outpatient provider promptly. Do not engage in alcohol and or illegal drug use while on prescription medicines. In the event of worsening symptoms call the crisis hotline, 911, and or go to the nearest emergency department for appropriate evaluation and treatment of symptoms. Follow-up with your primary care provider for your medical issues, concerns and or health care needs.   Keep all scheduled appointments.  If you are unable to keep an appointment call to reschedule.  Let the nurse know if you will need medications before next scheduled appointment.            Medication List    STOP taking these medications        citalopram 20 MG tablet  Commonly known as:  CELEXA      TAKE these medications      Indication   acamprosate 333 MG tablet  Commonly known as:  CAMPRAL  Take 2 tablets (666 mg total) by mouth 3 (three) times daily with meals. For Alcoholism   Indication:  Excessive Use of Alcohol     amLODipine 10 MG tablet  Commonly known as:  NORVASC  Take 1 tablet (10 mg total) by mouth daily. For high blood pressure   Indication:  High Blood Pressure      atorvastatin 20 MG tablet  Commonly known as:  LIPITOR  Take 1 tablet (20 mg total) by mouth daily. For hyperlipidemia   Indication:  Hyperlipidemia     hydrOXYzine 25 MG tablet  Commonly known as:  ATARAX/VISTARIL  Take 1 tablet (25 mg total) by mouth 3 (three) times daily as needed for anxiety (sleep).   Indication:  anxiety/ sleep     ibuprofen 200 MG tablet  Commonly known as:  ADVIL,MOTRIN  Take 200 mg by mouth every 6 (six) hours as needed for moderate pain.      multivitamin with minerals Tabs tablet  Take 1 tablet by mouth daily. For nutritional support   Indication:  Nutritional Support       Follow-up Information    Follow up with [facility] On 12/13/2014.  Why:  at 10:00am with Susa Loffler. She will contact you this coming week as well.    Contact information:   [address] [place] [address]      Follow-up recommendations:  Activity:  As tolerated Diet:  As tolerated  Comments:   Patient has been instructed to take medications as prescribed; and report adverse effects to outpatient provider.  Follow up with primary doctor for any medical issues and If symptoms recur report to nearest emergency or crisis hot line.    Total Discharge Time: greater than 30 minutes  Signed: Earleen Newport, FNP-BC 11/26/2014, 1:22 PM  I personally assessed the patient and formulated the plan Geralyn Flash A. Sabra Heck, M.D.

## 2014-11-26 NOTE — Progress Notes (Signed)
D: Patient is alert and oriented. Pt's mood and affect is pleasant/euthymic and appropriate to circumstance. Pt denies SI/HI and AVH. Pt rates depression 1/10, hopelessness 0/10, and anxiety 2/10. Pt reports her goal for the day is "recovery at home with family." Pt reports she is ready to D/C from [facility] today. Pt is attending unit groups.  A: Active listening by RN. Encouragement/Support provided to pt. Medication education reviewed with pt. Scheduled medications administered per providers orders (See MAR). 15 minute checks continued per protocol for patient safety.  R: Patient cooperative and receptive to nursing interventions. Pt remains safe.

## 2014-12-20 ENCOUNTER — Encounter (HOSPITAL_COMMUNITY): Payer: Self-pay | Admitting: Psychology

## 2014-12-21 ENCOUNTER — Other Ambulatory Visit (HOSPITAL_COMMUNITY): Payer: BLUE CROSS/BLUE SHIELD | Attending: Psychiatry | Admitting: Psychology

## 2014-12-21 DIAGNOSIS — F17210 Nicotine dependence, cigarettes, uncomplicated: Secondary | ICD-10-CM | POA: Insufficient documentation

## 2014-12-21 DIAGNOSIS — F4312 Post-traumatic stress disorder, chronic: Secondary | ICD-10-CM | POA: Insufficient documentation

## 2014-12-21 DIAGNOSIS — I10 Essential (primary) hypertension: Secondary | ICD-10-CM | POA: Insufficient documentation

## 2014-12-21 DIAGNOSIS — F332 Major depressive disorder, recurrent severe without psychotic features: Secondary | ICD-10-CM | POA: Diagnosis not present

## 2014-12-21 DIAGNOSIS — F1020 Alcohol dependence, uncomplicated: Secondary | ICD-10-CM | POA: Diagnosis present

## 2014-12-21 DIAGNOSIS — F329 Major depressive disorder, single episode, unspecified: Secondary | ICD-10-CM

## 2014-12-23 ENCOUNTER — Other Ambulatory Visit (HOSPITAL_COMMUNITY): Payer: BLUE CROSS/BLUE SHIELD | Admitting: Licensed Clinical Social Worker

## 2014-12-23 ENCOUNTER — Encounter (HOSPITAL_COMMUNITY): Payer: Self-pay | Admitting: Medical

## 2014-12-23 ENCOUNTER — Encounter (HOSPITAL_COMMUNITY): Payer: Self-pay | Admitting: Psychology

## 2014-12-23 DIAGNOSIS — Z6372 Alcoholism and drug addiction in family: Secondary | ICD-10-CM | POA: Insufficient documentation

## 2014-12-23 DIAGNOSIS — F1994 Other psychoactive substance use, unspecified with psychoactive substance-induced mood disorder: Secondary | ICD-10-CM

## 2014-12-23 DIAGNOSIS — Z811 Family history of alcohol abuse and dependence: Secondary | ICD-10-CM | POA: Insufficient documentation

## 2014-12-23 DIAGNOSIS — F1020 Alcohol dependence, uncomplicated: Secondary | ICD-10-CM | POA: Diagnosis not present

## 2014-12-23 DIAGNOSIS — F4312 Post-traumatic stress disorder, chronic: Secondary | ICD-10-CM

## 2014-12-23 HISTORY — DX: Post-traumatic stress disorder, chronic: F43.12

## 2014-12-23 NOTE — Progress Notes (Signed)
Psychiatric Initial Adult Assessment   Patient Identification: [name] MRN:  [id number] Date of Evaluation:  12/23/2014 Referral Source: Dr Sabra Heck [facility] Inpatient services Chief Complaint:  "I want to get better;Iwant to be sober;I want to feel good about myself again" (last remembers feeling good about herself 2 yrs ago-was in relationship-split up-probably related to her drinking problem) Chief Complaint    Alcohol Problem; Depression     Visit Diagnosis:  Diagnosis:  ICD-9-CM ICD-10-CM PL 1.  Alcohol use disorder, severe, dependence   303.90 F10.20 Change Dx 2.  Dysfunctional family due to alcoholism   V61.41 Z63.72 Change Dx 3.  Chronic post-traumatic stress disorder (PTSD)   309.81 F43.12 Change Dx 4.  Family history of alcoholism in father   V61.41 Z64.72 Change Dx 5.  Substance induced mood disorder    Patient Active Problem List   Diagnosis Date Noted  . Alcohol dependence with uncomplicated withdrawal [S82.707]   . Major depressive disorder, recurrent, severe without psychotic features [F33.2]   . H/O suicide attempt [Z91.89] 11/21/2014  . Drug overdose, intentional [T50.902A] 11/21/2014  . Alcohol intoxication [F10.129] 11/21/2014  . Hypotension [I95.9] 11/21/2014  . 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 Overdose [T50.901A]   . Hypothyroidism [E03.9] 03/31/2014  . Depression [F32.9] 03/31/2014  . Alcohol abuse [F10.10] 12/07/2013  . Alcohol dependence [F10.20] 12/09/2011  . Substance induced mood disorder [F19.94] 12/09/2011   History of Present Illness:      [name] is an 59 y.o. female presenting to ED due to overdose of Celexa and etoh consumption. Pt reports she has an on-going problem with etoh . Tonight she was drinking heavily, 14 beers, and took 12-20 Celexa "to ease the pain." Pt denies this was a suicidal act. She denies past suicidal gestures, she reports she has had thoughts in the past like "maybe I  would be better off dead," but denies planning or intent. Pt denies HI, self-harm, a/v hallucinations. Pt is drowsy but oriented times 4. Mood is depressed with affect congruent to mood. Judgement is impaired, speech is logical and coherent.  Pt denies hx of depression but notes she takes Celexa "to take the edge off," "and to help with stress." Pt has been dx with Major Depressive Disorder in 2014 when inpt at [facility] for alcohol used disorder. Pt does not currently see a counselor or psychiatrist, but goes to PCP for medication.   Pt denies anxiety, phobias, or history of PTSD. Pt denies hx of abuse.   Pt began drinking at age 56 and has been drinking daily 8-9 beers for months. She reports she went to rehab last summer and was able to maintain sobriety for 3 weeks. Pt denies hx of seizures with withdrawal and reports feeling good when she does not drink. She would like to get assistance to stop drinking. "I just can't seem to stop."    Hospital Course:  I'm dependent on alcohol and I need to do something about it"  History of Present Illness:: 59 year old female who reports history of alcohol dependence, and has been drinking daily for several months. Has been drinking up to 24 beers per day, sometimes more on weekend. Patient describes depression, which she attributes at least partially to alcohol dependence, and more recently to loss of loved one.   [name] was admitted to the hospital with a blood alcohol level of 185. She was intoxicated requiring detoxification treatments. She received Librium detox protocols. She was also enrolled  in and participated in the group counseling sessions being offered and held on this unit. She learned coping skills. She was medicated and discharged on acamprosate 666 mg three times daily for alcoholism, Hydroxyzine 25 mg three times daily as needed for anxiety and Sertraline 50 mg daily for depression. [name] was resumed on her pertinent home medications for her other  pre-existing medical issues that she presented. She tolerated her treatment regimen without any adverse effects and or reactions.  [name] has completed detox treatment and her mood is stable. This is evidenced by her reports of improved mood and absence of substance withdrawal syndrome. She is currently being discharged to continue treatment at the [facility]. She is provided with all the pertinent information required to make this appointment without problems. Upon discharge, she adamantly denies any SIHI, AVH, delusional thoughts, paranoia and or withdrawal symptoms. She received from the [place], a 4 days worth, supply samples of her [facility] discharge medications. She left [facility] with all personal belongings in no apparent distress. Transportation per patient's arrangement.    On 12/21/14 pt met with Brandon Melnick for Documentaion and entry into [facility] CD IOP confirming above history and was accepted to program:  Summary: The patient was new to the group and reserved, but observant in this first group session. When asked to introduce herself, she recounted a growing alcoholism coupled with a loneliness that has led her upstairs to detox and psych stabilization 4 times in the last few years. She described herself as being "alone" and is clearly in pain over this. When questioned further, she explained that she is not in a relationship and hasn't been in one for at least 2 years. It was pointed out that she has 2 sons, both of whom live nearby. She also has 2 dogs and 5 cats. The patient admitted she drinks alone and doesn't really have any friends. She presented as very flat today. She remained attentive, but distant for the remainder of the session. She will meet with the program director during the next session. Her sobriety date is 5/23.       Associated Signs/Symptoms:SEE HPI Depression Symptoms:  depressed mood, feelings of worthlessness/guilt, recurrent thoughts of death, loss of  energy/fatigue,PHQ 5 6/23 PHQ 13 5/22 (Hypo) Manic Symptoms:  Denies Anxiety Symptoms:  Obsessive Compulsive Symptoms:   None,, Psychotic Symptoms:  Delusions, Hallucinations: None  PTSD Symptoms: Negative  Past Medical History:  Past Medical History  Diagnosis Date  . Hypertension   . Depression   . Suicidal overdose   . Alcoholism     Past Surgical History  Procedure Laterality Date  . Fracture surgery      fractured left ankle 2007  . Ganglion cyst excision      left foot   Family History:  Family History  Problem Relation Age of Onset  . Depression Mother   . Alcohol abuse Father    Social History:   History   Social History  . Marital Status: Single    Spouse Name: N/A  . Number of Children: N/A  . Years of Education: N/A   Social History Main Topics  . Smoking status: Current Every Day Smoker -- 1.00 packs/day for 5 years    Types: Cigarettes  . Smokeless tobacco: Never Used  . Alcohol Use: 30.0 oz/week    50 Cans of beer per week     Comment: daily 6-7 cans of beer  . Drug Use: No  . Sexual Activity: Yes  Birth Control/ Protection: None   Other Topics Concern  . None   Social History Narrative   Additional Social History: NA  Musculoskeletal: Strength & Muscle Tone: within normal limits Gait & Station: normal Patient leans: N/A  Psychiatric Specialty Exam: HPI  History of Present Illness:      [name] is an 59 y.o. female presenting to ED due to overdose of Celexa and etoh consumption. Pt reports she has an on-going problem with etoh . Tonight she was drinking heavily, 14 beers, and took 12-20 Celexa "to ease the pain." Pt denies this was a suicidal act. She denies past suicidal gestures, she reports she has had thoughts in the past like "maybe I would be better off dead," but denies planning or intent. Pt denies HI, self-harm, a/v hallucinations. Pt is drowsy but oriented times 4. Mood is depressed with affect congruent to mood.  Judgement is impaired, speech is logical and coherent.  Pt denies hx of depression but notes she takes Celexa "to take the edge off," "and to help with stress." Pt has been dx with Major Depressive Disorder in 2014 when inpt at [facility] for alcohol used disorder. Pt does not currently see a counselor or psychiatrist, but goes to PCP for medication.   Pt denies anxiety, phobias, or history of PTSD. Pt denies hx of abuse.   Pt began drinking at age 12 and has been drinking daily 8-9 beers for months. She reports she went to rehab last summer and was able to maintain sobriety for 3 weeks. Pt denies hx of seizures with withdrawal and reports feeling good when she does not drink. She would like to get assistance to stop drinking. "I just can't seem to stop."    Hospital Course:  I'm dependent on alcohol and I need to do something about it"  History of Present Illness:: 59 year old female who reports history of alcohol dependence, and has been drinking daily for several months. Has been drinking up to 24 beers per day, sometimes more on weekend. Patient describes depression, which she attributes at least partially to alcohol dependence, and more recently to loss of loved one.   [name] was admitted to the hospital with a blood alcohol level of 185. She was intoxicated requiring detoxification treatments. She received Librium detox protocols. She was also enrolled in and participated in the group counseling sessions being offered and held on this unit. She learned coping skills. She was medicated and discharged on acamprosate 666 mg three times daily for alcoholism, Hydroxyzine 25 mg three times daily as needed for anxiety and Sertraline 50 mg daily for depression. [name] was resumed on her pertinent home medications for her other pre-existing medical issues that she presented. She tolerated her treatment regimen without any adverse effects and or reactions.  [name] has completed detox treatment and her mood is  stable. This is evidenced by her reports of improved mood and absence of substance withdrawal syndrome. She is currently being discharged to continue treatment at the [facility]. She is provided with all the pertinent information required to make this appointment without problems. Upon discharge, she adamantly denies any SIHI, AVH, delusional thoughts, paranoia and or withdrawal symptoms. She received from the [facility], a 4 days worth, supply samples of her [facility] discharge medications. She left [facility] with all personal belongings in no apparent distress. Transportation per patient's arrangement.    On 12/21/14 pt met with Brandon Melnick for Documentaion and entry into [facility] CD IOP confirming  above history and was accepted to program:  Summary: The patient was new to the group and reserved, but observant in this first group session. When asked to introduce herself, she recounted a growing alcoholism coupled with a loneliness that has led her upstairs to detox and psych stabilization 4 times in the last few years. She described herself as being "alone" and is clearly in pain over this. When questioned further, she explained that she is not in a relationship and hasn't been in one for at least 2 years. It was pointed out that she has 2 sons, both of whom live nearby. She also has 2 dogs and 5 cats. The patient admitted she drinks alone and doesn't really have any friends. She presented as very flat today. She remained attentive, but distant for the remainder of the session. She will meet with the program director during the next session. Her sobriety date is 5/23.         ROS Review of Systems:  Positive and negative as per HPI otherwise all other systems are negative   There were no vitals taken for this visit.  Vitals - 1 value per visit 11/26/2014 11/22/2014 11/21/2014 27/08/5364  SYSTOLIC 440 347  425  DIASTOLIC 79 85  86  Pulse 84 73  71  Temperature 97.7 98.2  97.4  Respirations '16 20  16  ' Weight (lb)   150 154.54   Height  '5\' 1"'  '5\' 1"'    BMI  28.36 29.22    Vitals - 1 value per visit 03/28/2014 03/27/2014 12/08/2013 03/05/6386 11/04/4330  SYSTOLIC 951  884 166   DIASTOLIC 76  80 82   Pulse 82  70 79   Temperature 98.2  97.9 98.2   Respirations '14  16 16   ' Weight (lb) 146 150   148  Height '5\' 1"'  '5\' 1"'    '5\' 0"'   BMI 27.6 28.36   28.9   Vitals - 1 value per visit 01/06/2012 12/12/2011 12/09/2011 12/09/2011 0/63/0160  SYSTOLIC 109 323  557 322  DIASTOLIC 64 74  74 74  Pulse 78 94  73 72  Temperature 98 97.8  98.2 98.2  Respirations '12 16  16 17  ' Weight (lb)   139    Height   5' 1.25"    BMI   26.04      General Appearance: Fairly Groomed  Eye Contact:  Fair  Speech:  Clear and Coherent  Volume:Normal  Mood:  Euthymic PHQ 5  Affect:  Congruent  Thought Process:  Circumstantial, Coherent and Intact  Orientation:  Full (Time, Place, and Person)  Thought Content:  WDL  Suicidal Thoughts:  No  Homicidal Thoughts:  No  Memory:  Negative  Judgement:  Impaired  Insight:  Lacking  Psychomotor Activity:  Normal  Concentration:  Fair  Recall:  [employer] of Knowledge:Fair  Language: Good  Akathisia:  NA  Handed:  Right  AIMS (if indicated):  AIMS: Facial and Oral Movements Muscles of Facial Expression: None, normal Lips and Perioral Area: None, normal Jaw: None, normal Tongue: None, normal,Extremity Movements Upper (arms, wrists, hands, fingers): None, normal Lower (legs, knees, ankles, toes): None, normal, Trunk Movements Neck, shoulders, hips: None, normal, Overall Severity Severity of abnormal movements (highest score from questions above): None, normal Incapacitation due to abnormal movements: None, normal Patient's awareness of abnormal movements (rate only patient's report): No Awareness, Dental Status Current problems with teeth and/or dentures?: No Does patient usually wear dentures?: No  Assets:  Communication Skills Desire for Improvement Financial  Resources/Insurance Housing Social Support  ADL's:  Intact  Cognition: WNL  Sleep:  No complaint   Is the patient at risk to self?  No. Has the patient been a risk to self in the past 6 months?  No. Has the patient been a risk to self within the distant past?  No. Is the patient a risk to others?  No. Has the patient been a risk to others in the past 6 months?  No. Has the patient been a risk to others within the distant past?  No.  Allergies:  No Known Allergies Current Medications: Current Outpatient Prescriptions  Medication Sig Dispense Refill  . acamprosate (CAMPRAL) 333 MG tablet Take 2 tablets (666 mg total) by mouth 3 (three) times daily with meals. For Alcoholism 180 tablet 0  . amLODipine (NORVASC) 10 MG tablet Take 1 tablet (10 mg total) by mouth daily. For high blood pressure    . atorvastatin (LIPITOR) 20 MG tablet Take 1 tablet (20 mg total) by mouth daily. For hyperlipidemia  3  . hydrOXYzine (ATARAX/VISTARIL) 25 MG tablet Take 1 tablet (25 mg total) by mouth 3 (three) times daily as needed for anxiety (sleep). 60 tablet 0  . ibuprofen (ADVIL,MOTRIN) 200 MG tablet Take 200 mg by mouth every 6 (six) hours as needed for moderate pain.    . Multiple Vitamin (MULTIVITAMIN WITH MINERALS) TABS tablet Take 1 tablet by mouth daily. For nutritional support 30 tablet 0  . sertraline (ZOLOFT) 50 MG tablet 1 TABLET ONCE A DAY ORALLY  6  . [DISCONTINUED] levothyroxine (SYNTHROID, LEVOTHROID) 50 MCG tablet Take 1 tablet (50 mcg total) by mouth daily before breakfast. For low thyroid function (Patient not taking: Reported on 11/21/2014) 30 tablet 0   No current facility-administered medications for this visit.    Previous Psychotropic Medications: Yes Celexa, Zoloft   Substance Abuse History in the last 12 months:  Yes.     Consequences of Substance Abuse: Withdrawal Symptoms:   Nausea Tremors : Medical Consequences:  Suicidal depressions Legal Consequences:  NONE Family  Consequences:  Stress Withdrawal Symptoms:   Nausea Tremors  Medical Decision Making:  Review or order clinical lab tests (1), Review and summation of old records (2), Established Problem, Worsening (2) and Review of Medication Regimen & Side Effects (2)  Treatment Plan Summary: [facility] CDIOP UDS PER PROTOCOL FU PRN/AS SCHEDULED    Darlyne Russian E 6/24/20161:36 PM

## 2014-12-23 NOTE — Progress Notes (Signed)
    Daily Group Progress Note  Program: CD-IOP   Group Time: 1-2:30 pm  Participation Level: Active  Behavioral Response: Appropriate  Type of Therapy: Process Group  Topic: Process; the first part of group was spent in process. Members shared about the things they had done since the last group to strengthen their recovery. They also disclosed any challenges, cravings or other issues that may have appeared since we last met. Two new group members were present today and they introduced themselves and told their new fellow group members what had brought them here. Drug tests were collected from everyone present today.  Group Time: 2:45- 4pm  Participation Level: Minimal  Behavioral Response: Sharing  Type of Therapy: Psycho-education Group  Topic: "The Neurobiology of Addiction and "People, Places and Things": the second half of group was spent in a psycho-ed. A brief clip from the CD, "The Neurobiology of Addiction" was shown. It focused on what goes on in the brain when one is exposed to something related to their drinking/drugging. Specifically, the power of the 'emotional memory' and how chemical changes generate discomfort and cravings. There was a good discussion with members providing examples of how they have felt at certain times and around certain people.    Summary: The patient was new to the group and reserved, but observant in this first group session. When asked to introduce herself, she recounted a growing alcoholism coupled with a loneliness that has led her upstairs to detox and psych stabilization 4 times in the last few years. She described herself as being "alone" and is clearly in pain over this. When questioned further, she explained that she is not in a relationship and hasn't been in one for at least 2 years. It was pointed out that she has 2 sons, both of whom live nearby. She also has 2 dogs and 5 cats. The patient admitted she drinks alone and doesn't really have  any friends. She presented as very flat today. She remained attentive, but distant for the remainder of the session. She will meet with the program director during the next session. Her sobriety date is 5/23.    Family Program: Family present? No   Name of family member(s):   UDS collected: Yes Results: pending  AA/NA attended?: No  Sponsor?: No   Akshar Starnes, LCAS

## 2014-12-26 ENCOUNTER — Other Ambulatory Visit (HOSPITAL_COMMUNITY): Payer: BLUE CROSS/BLUE SHIELD | Admitting: Psychology

## 2014-12-26 ENCOUNTER — Encounter (HOSPITAL_COMMUNITY): Payer: Self-pay | Admitting: Licensed Clinical Social Worker

## 2014-12-26 DIAGNOSIS — F1020 Alcohol dependence, uncomplicated: Secondary | ICD-10-CM | POA: Diagnosis not present

## 2014-12-27 ENCOUNTER — Encounter (HOSPITAL_COMMUNITY): Payer: Self-pay | Admitting: Psychology

## 2014-12-27 DIAGNOSIS — F1020 Alcohol dependence, uncomplicated: Secondary | ICD-10-CM | POA: Insufficient documentation
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
## 2014-12-28 ENCOUNTER — Other Ambulatory Visit (HOSPITAL_COMMUNITY): Payer: BLUE CROSS/BLUE SHIELD | Admitting: Psychology

## 2014-12-28 DIAGNOSIS — F1020 Alcohol dependence, uncomplicated: Secondary | ICD-10-CM

## 2014-12-28 DIAGNOSIS — F329 Major depressive disorder, single episode, unspecified: Secondary | ICD-10-CM
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
## 2014-12-30 ENCOUNTER — Other Ambulatory Visit (HOSPITAL_COMMUNITY): Payer: BLUE CROSS/BLUE SHIELD | Attending: Psychiatry | Admitting: Psychology

## 2014-12-30 DIAGNOSIS — F17210 Nicotine dependence, cigarettes, uncomplicated: Secondary | ICD-10-CM | POA: Insufficient documentation

## 2014-12-30 DIAGNOSIS — F1020 Alcohol dependence, uncomplicated: Secondary | ICD-10-CM | POA: Insufficient documentation

## 2014-12-30 DIAGNOSIS — Z6372 Alcoholism and drug addiction in family: Secondary | ICD-10-CM | POA: Diagnosis not present

## 2014-12-30 DIAGNOSIS — F4312 Post-traumatic stress disorder, chronic: Secondary | ICD-10-CM | POA: Diagnosis not present

## 2015-01-03 ENCOUNTER — Encounter (HOSPITAL_COMMUNITY): Payer: Self-pay | Admitting: Psychology

## 2015-01-03 NOTE — Progress Notes (Signed)
    Daily Group Progress Note  Program: CD-IOP   Group Time: 1-2:30 pm  Participation Level: Active  Behavioral Response: Appropriate  Type of Therapy: Activity Group  Topic: Activity: the first part of group was spent in a chair yoga class. Members walked down to the gym where a certified yoga teacher awaited them. Chairs were placed in a circle and members sat and followed her directions. The importance of learning to reduce stress and tension throughout one's day was emphasized. These positions and movements reduce tension and stress throughout one's body. At the conclusion of the session, members went back up to the group room and a 15 minute break followed.   Group Time: 2:45- 4pm  Participation Level: Active  Behavioral Response: Appropriate  Type of Therapy: Process Group  Topic: Process/Graduation: the second half of group was spent ion process. Members shared about current issues and challenges to their recovery and identified what they had done to support and strengthen their daily program of sobriety. During the part of group, the program director met with group members, including one new group member and two members who are discharging today or next week. At the conclusion of group today, a graduation ceremony was held honoring a successfully graduating group member. Her mother joined the group and was witness to the kind words of respect and encouragement were shared by her daughter's fellow group members. The session was emotional and very touching with the graduating member sharing her own words of hope and support for those she leaves behind.   Summary: The patient was attentive and engaged, but shared little of her experience of the yoga class. In process, she reported that she had attended 1 AA meeting and it had been a good experience and very supportive. When someone asked her what she does besides work, the patient admitted she hadn't done much in the past few months.  "I have to get back to the gym", she reported and explained that she hadn't been to the gym in months. I suggested that we keep her accountable and follow up on asking whether she has gone the gym. The patient agreed this might motivate her. Another group member stated she would be at the 11 am AA meeting tomorrow at the [employer] as would another group member. They asked her to meet them there. It remains to be seen whether this patient will go to the meeting despite having little to do this weekend. She stated that her plans for the coming weekend include attending church and going to a cook-out that her son and daughter-in-law are hosting. During the graduation, the patient admitted she didn't really know the member who was completing, but she wished her well. The patient responded well to this intervention and her sobriety date remains 5/23.   Family Program: Family present? No   Name of family member(s):   UDS collected: No Results:  AA/NA attended?: Yes Thursday  Sponsor?: No   Harmonie Verrastro, LCAS

## 2015-01-03 NOTE — Progress Notes (Signed)
    Daily Group Progress Note  Program: CD-IOP   Group Time: 1-2:30 pm  Participation Level: Active  Behavioral Response: Appropriate  Type of Therapy: Process  Topic: Process: the first part of group was spent in process. Members shared about what they had done to support their recovery, including attending meetings, working with their sponsor and doing step-work. also requested in these disclosures are things that promote good self-care, including going to the gym, eating well, and learning to live in a clean and sober way. Drug tests were collected from a few of the members today. Also present was a new group member who introduced herself to the group.   Group Time: 2:45- 4pm  Participation Level: Active  Behavioral Response: Sharing  Type of Therapy: Psycho-education Group  Topic: Serenity Prayer: What you can and cannot change/Graduation. The second half of group was spent in a psycho-ed. Handouts were provided that included the Serenity Prayer and then asked that the patient list 5 things they can change and 5 things they cannot change. The rest of the session included members sharing what they had written on their handouts. At the end of the session, a graduation ceremony was held honoring a group member who was completing the program and moving onto the next phase of this new lifestyle of sobriety. Kind words were spoken and it was a very touching heartfelt ending to this group session.   Summary: The patient reported she had met with her counselor after group on Monday and it had been very good session. She admitted she was exhausted at the end of the day and just spent the rest of the evening keeping still and preparing for the next work day. She admitted she had not attended any AA meetings, but planned on going before the end of the week. During the psycho-ed on the Serenity Prayer, the patient noted that she cannot change that "I am an alcoholic and I always will be". She  remains somewhat reserved in group sessions, but she is still fairly new and her isolating withdrawn ways have contributed to her addiction. We will continue to work to 'pull her out' and get her sharing more and providing more feedback. The patient shared kind words with the graduating member and wished her well. She responded well to this intervention and her sobriety date remains 5/23.   Family Program: Family present? No   Name of family member(s):   UDS collected: No Results:   AA/NA attended?: No  Sponsor?: No   Lakara Weiland, LCAS

## 2015-01-04 ENCOUNTER — Other Ambulatory Visit (HOSPITAL_COMMUNITY): Payer: BLUE CROSS/BLUE SHIELD | Admitting: Psychology

## 2015-01-04 DIAGNOSIS — F1020 Alcohol dependence, uncomplicated: Secondary | ICD-10-CM | POA: Diagnosis not present

## 2015-01-04 DIAGNOSIS — F4312 Post-traumatic stress disorder, chronic: Secondary | ICD-10-CM

## 2015-01-04 DIAGNOSIS — F329 Major depressive disorder, single episode, unspecified: Secondary | ICD-10-CM

## 2015-01-06 ENCOUNTER — Other Ambulatory Visit (HOSPITAL_COMMUNITY): Payer: BLUE CROSS/BLUE SHIELD | Admitting: Psychology

## 2015-01-06 ENCOUNTER — Encounter (HOSPITAL_COMMUNITY): Payer: Self-pay | Admitting: Psychology

## 2015-01-06 DIAGNOSIS — F329 Major depressive disorder, single episode, unspecified: Secondary | ICD-10-CM

## 2015-01-06 DIAGNOSIS — F1020 Alcohol dependence, uncomplicated: Secondary | ICD-10-CM | POA: Diagnosis not present

## 2015-01-06 DIAGNOSIS — F4312 Post-traumatic stress disorder, chronic: Secondary | ICD-10-CM

## 2015-01-06 NOTE — Progress Notes (Signed)
This encounter was created in error - please disregard.  This encounter was created in error - please disregard.

## 2015-01-08 ENCOUNTER — Encounter (HOSPITAL_COMMUNITY): Payer: Self-pay | Admitting: Psychology

## 2015-01-08 NOTE — Progress Notes (Signed)
Daily Group Progress Note  Program: CD-IOP   Group Time: 1:30 pm  Participation Level: Active  Behavioral Response: Appropriate and Sharing  Type of Therapy: Psycho-education Group  Topic: Psycho-Ed: Visit with Chaplain. The first part of group was spent in a psycho-ed with a visiting chaplain. After introductions, a mindfulness exercise with a "raisin" was led by the visitor. A discussion ensued at the conclusion of this exercise with group members providing feedback about their own experience during the exercise. This followed with a more in-depth conversation with the chaplain and specific members about their feelings and 'practicing" sharing them with others as opposed to stuffing or hiding them. The session was intense with all in the room very present and learning the value and comfort that comes from being honest and open about one's feelings.   Group Time: 2:45-4pm  Participation Level: Active  Behavioral Response: Appropriate and Sharing  Type of Therapy: Process Group  Topic: Process: the second half of group consisted of members disclosing what they have been doing in their recovery and the specific things they do every day to reinforced and strengthen this new lifestyle. During the session today, the medical director met with the 2 newest group members.    Summary: The patient was attentive and engaged in the session with the chaplain. He probed her disclosures and she became very emotional as she shared that her underlying emotion was hurt. The chaplain attempted to go further with this feeling, but the patient came close to tears and was unwilling to discuss this anymore. In process, the patient reported she had attended 1 AA meeting since Wednesday. It had been a very good meeting and addressed the topic of 'gratitude'. She admitted that she needed to be more grateful about her life. She remains somewhat withdrawn and resistant to disclosing the painful events of her  life. But, she has reminded me that she is still fairly new in group and it will take time for her to open up. She responded as well as she is capable at this time, though, and her sobriety date remains 5/23.   Family Program: Family present? No   Name of family member(s):   UDS collected: No Results:   AA/NA attended?: YesThursday  Sponsor?: No   ,, LCAS        

## 2015-01-09 ENCOUNTER — Other Ambulatory Visit (HOSPITAL_COMMUNITY): Payer: BLUE CROSS/BLUE SHIELD | Admitting: Psychology

## 2015-01-09 DIAGNOSIS — F1020 Alcohol dependence, uncomplicated: Secondary | ICD-10-CM | POA: Diagnosis not present

## 2015-01-09 DIAGNOSIS — F329 Major depressive disorder, single episode, unspecified: Secondary | ICD-10-CM

## 2015-01-09 DIAGNOSIS — F4312 Post-traumatic stress disorder, chronic: Secondary | ICD-10-CM

## 2015-01-10 ENCOUNTER — Encounter (HOSPITAL_COMMUNITY): Payer: Self-pay | Admitting: Psychology

## 2015-01-10 NOTE — Progress Notes (Signed)
    Daily Group Progress Note  Program: CD-IOP   Group Time: 1-2:30 pm  Participation Level: Active  Behavioral Response: Appropriate and Sharing  Type of Therapy: Process Group  Topic: Process: the first half of group was spent in process. Members shared about current issues and concerns in early recovery. They were asked to disclose what they had done since Friday to address and support their recovery. These includes:  12-Step meetings, talking to others in recovery, meeting with sponsor, Step Work, exercise and having fun in clean and sober ways.   Group Time: 2:45-4pm  Participation Level: Active  Behavioral Response: Appropriate  Type of Therapy: Psycho-education Group  Topic: Psycho-Ed: Cognitive Distortions: Wrap Up. The second half of group was spent reviewing the cognitive distortions that have been taught by our visiting facilitator. A handout was provided and then a "Jeopardy Game" was held with members breaking up into 2 teams and competing against each other. This proved to be an excellent way to test and underscore the 14 different cognitive distortions the training has consisted of. There was good feedback and discussion among group members. Drug tests were collected today.   Summary: The patient reported she had had a good weekend. She reported she had attended the 11 am women's meeting at the [place] on Saturday and then gone to church and the 4 pm at [facility] on Sunday. She had seen another group member there. Another group member noted that "you look more upbeat" and other group members agreed with this assessment. The patient reported she feels good about herself and did good things for herself and her sobriety over the weekend. She was pleased with herself and admitted that she has so much to be grateful for. She was active in the psycho-ed and displayed a good understanding of the cognitive distortions. The patient was more engaged and seemed more content about herself  today than in previous sessions. When asked about her depressive symptoms, she denied feeling depressed. She responded well to this intervention and her sobriety date is 5/23.    Family Program: Family present? No   Name of family member(s):   UDS collected: No Results:   AA/NA attended?: YesSaturday and Sunday  Sponsor?: No   Frimy Uffelman, LCAS

## 2015-01-11 ENCOUNTER — Other Ambulatory Visit (HOSPITAL_COMMUNITY): Payer: BLUE CROSS/BLUE SHIELD | Admitting: Psychology

## 2015-01-11 DIAGNOSIS — F1020 Alcohol dependence, uncomplicated: Secondary | ICD-10-CM | POA: Diagnosis not present

## 2015-01-11 DIAGNOSIS — F4312 Post-traumatic stress disorder, chronic: Secondary | ICD-10-CM

## 2015-01-11 DIAGNOSIS — F329 Major depressive disorder, single episode, unspecified: Secondary | ICD-10-CM

## 2015-01-13 ENCOUNTER — Encounter (HOSPITAL_COMMUNITY): Payer: Self-pay | Admitting: Psychology

## 2015-01-13 ENCOUNTER — Other Ambulatory Visit (HOSPITAL_COMMUNITY): Payer: BLUE CROSS/BLUE SHIELD | Admitting: Licensed Clinical Social Worker

## 2015-01-13 DIAGNOSIS — F1020 Alcohol dependence, uncomplicated: Secondary | ICD-10-CM

## 2015-01-13 NOTE — Progress Notes (Signed)
    Daily Group Progress Note  Program: CD-IOP   Group Time: 1-2:30 pm  Participation Level: Active  Behavioral Response: Appropriate and Sharing  Type of Therapy: Process Group  Topic: Process: the first part of group was spent in process. Members shared about current issues and concerns in early recovery. Each member was asked to identify what they had done to support their new sober lifestyle since the last group session. Present in the session today were 2 new group members. They introduced themselves and shared about what had brought them here. There was good discussion and feedback among new and old group members during this half of group.   Group Time: 2:45- 4pm  Participation Level: Active  Behavioral Response: Sharing  Type of Therapy: Psycho-education Group  Topic: Psycho-Ed: Recovery; Repeating New Behaviors in Early Recovery/Graduation. The second half of group was spent in a psycho-ed focusing on the importance of changing thinking, attitudes and behaviors in early recovery. Special emphasis was given on repeating these new ways of living. A graduation ceremony was held in the last part of this session. A member was leaving the group and program and moving onto this next phase of her new life. Her mother came for the graduation and there were kind words and tears in this ceremony.   Summary: The patient reported she had attended 1 AA meeting since last Friday. She noted that 2 other women from this program were there. She thanked one of them for having introduced her to some of the other ladies and helping her try to find a sponsor. She had gone to a cookout at her son's house, but hadn't done much on Sunday because she didn't feel well. The patient admitted that she was "still isolating". She reported that she has to reach out and go to meetings and meet other women in recovery. In the psycho-ed, the patient admitted that much of the structure, other than work, had gone out  of her life and she generally just went home and drank [employer] every evening. She had stopped going to gym and laughed as she noted she has been paying her monthly gym dues for about 10 months, but hasn't been there once. I encouraged her to get back to the gym and commit to a new weekly schedule and routine. She shared kind words with the graduating member and her sobriety date remains 5/23.   Family Program: Family present? No   Name of family member(s):   UDS collected: No Results:  AA/NA attended?: [place]  Sponsor?: No   Javayah Magaw, LCAS

## 2015-01-16 ENCOUNTER — Encounter (HOSPITAL_COMMUNITY): Payer: Self-pay | Admitting: Licensed Clinical Social Worker

## 2015-01-16 ENCOUNTER — Other Ambulatory Visit (HOSPITAL_COMMUNITY): Payer: BLUE CROSS/BLUE SHIELD | Admitting: Psychology

## 2015-01-16 DIAGNOSIS — F1020 Alcohol dependence, uncomplicated: Secondary | ICD-10-CM
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
## 2015-01-16 NOTE — Progress Notes (Addendum)
    Daily Group Progress Note  Program: CD-IOP   Group Time: 1-2:30  Participation Level: Active  Behavioral Response: Appropriate and Sharing  Type of Therapy: Process Group  Topic: Process: the first part of group was spent in process. Members shared about the past weekend and the activities that supported their early recovery. Present today was a new group member and she introduced herself and shared a little about her history with her new fellow group members. She received a warm welcome and 2 of the female group members gave her a list of AA meetings they attend.   Group Time: 2:45-4  Participation Level: Active  Behavioral Response: Appropriate and Sharing  Type of Therapy: Psycho-education Group  Topic: Psycho-Ed: visit with the Pharmacist. The second half of group was spent in a psycho-ed with Einar Grad, a psychiatrist from upstairs in [facility]. She discussed various types of drugs and explained the effects on one's mind and body. The visitor also discussed different types of medications, including anti-depressants, anxiety medications, pain meds as well as those for sleep. Members asked good questions and provided good feedback when appropriate. At the conclusion of the session, group members agreed that the session had proven very informative.   Summary: The patient reported she had had a busy weekend. She had a 3 hour hair appointment on Saturday morning and did some chores and yard work in the afternoon. She attended a meeting on Saturday at 6 pm and then another meeting on Sunday at 4 pm. She also went to church Sunday morning. In the psycho-ed, the patient admitted to the pharmacist that she had no energy and wondered why that was? The pharmacist explained that recovery is hard and exhausting and so is working a Dietitian. She was reminded that by expending energy, one generates more. This is difficult at first, but if she can push herself through the difficult first  part, the pharmacist felt certain that this patient would begin to develop more energy and with it, enthusiasm for being more active. I likened it to meetings and going to the gym. In each instance, when people are tired and don't feel like going is when they are energized after having gone. Other members validated these experiences. The patient continues to make good progress and she responded well to this intervention. Her sobriety date remains 5/23.  Family Program: Family present? No   Name of family member(s):   UDS collected: No Results:   AA/NA attended?: Yes, Saturday and Sunday  Sponsor?: No, but she is seeking a sponsor   Jazae Gandolfi S, Licensed Cli

## 2015-01-17 ENCOUNTER — Encounter (HOSPITAL_COMMUNITY): Payer: Self-pay | Admitting: Licensed Clinical Social Worker

## 2015-01-17 ENCOUNTER — Encounter (HOSPITAL_COMMUNITY): Payer: Self-pay | Admitting: Psychology

## 2015-01-17 NOTE — Progress Notes (Signed)
    Daily Group Progress Note  Program: CD-IOP   Group Time: 1-2:30 pm  Participation Level: Active  Behavioral Response: Sharing  Type of Therapy: Process Group  Topic: Process: the first half of group was spent in process. Members shared about current issue and concerns in early recovery. They are also asked to disclose what they had done to support or strengthen their recovery, including attending meetings, step work, meeting with sponsor and other activities that promote a healthy drug-free lifestyle.   Group Time: 2:45- 4pm  Participation Level: Active  Behavioral Response: Sharing  Type of Therapy: Psycho-education Group  Topic: Psycho-Ed: the second half of group was spent in a psycho-ed on "The Deactivation of Cravings". A handout was provided describing a scenario with a young man in early recovery who relapses. The group read the handout out loud and then identified just what the fellow had done wrong from the moment he got out of bed that morning. They were very astute and able to identify a number of things he had done that almost 'invited' relapse. The 4-step process of relapse: Trigger-Thought-Craving- Use, was also explained at length. The importance of addressing and challenging thoughts was emphasized. There was good feedback and comments from group members.   Summary: The patient reported she had not attended any meetings since the last group session. She reported she had gone home on Monday after group and mowed the lawn. She had felt badly since then and attributed it to her allergies and the pollen from mowing. I wondered if she had considered attending a meeting before she mowed the lawn and whether her priority was the lawn or her recovery. She stated she knew she would be questioned because she had not gone to any meetings, but she insisted she had not felt good and came home from work and just went to bed. In the psycho-ed, the pateitn laughed as she described her  efforts to hide her beer cans from her son. She crushed the cans and then tried to cover them up with other garbage. Members concurred with her efforts. The patient also reported, as others did, that she would go to different convenience stores so they didn't know she was buying beer every day. The patient offered a little more of herself in the psycho-ed today, but her seeming resistance to 12-step meetings is a concern. The patient responded well to this intervention and her sobriety date remains 5/23.   Family Program: Family present? No   Name of family member(s):   UDS collected: No Results:   AA/NA attended?: No  Sponsor?: No   Annaclaire Walsworth, LCAS

## 2015-01-17 NOTE — Progress Notes (Signed)
    Daily Group Progress Note  Program: CD-IOP   Group Time: 1-2:30  Participation Level: Active  Behavioral Response: Appropriate and Sharing  Type of Therapy: Process Group  Topic: After checking in, group members shared recovery-related activities and challenges they faced since the last group on Wednesday. They also reported any urges or cravings they may have experienced. Drug tests were returned.    Group Time: 2:45-4  Participation Level: Active  Behavioral Response: Appropriate and Sharing  Type of Therapy: Psycho-education Group  Topic: The second part of group focused on relapse in early recovery. Patients learned strategies to address the top four common reasons for relapse in early recovery:  physical cravings, psychological longing, physical pain, emotional pain. The group exercise was called "write it on the wall." Each patient was given sticky notes to write on and then they placed their sticky notes on 4 sheets of flip-chart paper entitled: People or situations that trigger urges to use, Thoughts of feelings that trigger urges to use, How do you overcome cravings, and What do you like most about yourself when you don't use. All patients participated and reported they enjoyed the activity.      Summary: Patient reported she went to a meeting last night and took the day off from work today. She reports she is well rested today and feels much better. Patient participated in the relapse prevention activity and identified her trigger to use is to cover her emotional pain. She also acknowledged a situation that would trigger her to relapse, loneliness. Patient is working on reaching out to widen her circle of sober women by meeting them in [place]. Patient reports her depressive symptoms have lessened since coming to treatment. Her sobriety date is 5/23.   Family Program: Family present? No   Name of family member(s):   UDS collected: No Results:   AA/NA attended?:  NoThursday  Sponsor?: No   MACKENZIE,LISBETH S, Licensed Cli

## 2015-01-18 ENCOUNTER — Other Ambulatory Visit (HOSPITAL_COMMUNITY): Payer: BLUE CROSS/BLUE SHIELD | Admitting: Psychology

## 2015-01-18 DIAGNOSIS — F4312 Post-traumatic stress disorder, chronic: Secondary | ICD-10-CM

## 2015-01-18 DIAGNOSIS — F1020 Alcohol dependence, uncomplicated: Secondary | ICD-10-CM

## 2015-01-18 DIAGNOSIS — F329 Major depressive disorder, single episode, unspecified: Secondary | ICD-10-CM

## 2015-01-19 ENCOUNTER — Encounter (HOSPITAL_COMMUNITY): Payer: Self-pay | Admitting: Psychology

## 2015-01-19 NOTE — Progress Notes (Signed)
    Daily Group Progress Note  Program: CD-IOP   Group Time: 1-2:30 pm  Participation Level: Active  Behavioral Response: Sharing  Type of Therapy: Process Group  Topic: Process: the first part of group was spent in process. Members shared about current issues and concerns in early recovery. The newest member discussed the challenge she is facing with her S/O who continues to drink. 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 Other group member provided encouragement and support.  Random drug tests were collected.   Group Time: 2:45- 4pm  Participation Level: Minimal  Behavioral Response: Sharing  Type of Therapy: Psycho-education Group  Topic: Feelings/Graduation: the second half of group was spent in a psycho-ed on the importance of Feelings. Handouts were provided and group members took turns reading from the first page. It was entitled, "Why Become Aware of Feelings", and proceeded to list answers to that same question. The next handout identified specific feelings and the reasons they are felt. Members shared some of their inner feelings and a discussion ensued. As the session neared the end a Graduation Ceremony was held. There were kind words of respect and hope for the graduating member and he responded with gratitude and tears.   Summary: The patient reported she had been very busy at work. She had gone home yesterday and mowed the lawn. When asked about attending any meetings, the patient admitted she had not gone to any AA meetings since the weekend. She shared little of herself in the psycho-ed, but had kind words for the graduating member. The patient noted that he didn't speak much, but it was always very true and honest when he did share. The patient seems resistant to opening up and she is terrified of being vulnerable. She must get more involved in AA if she is truly going to change and keep herself alcohol-free going  forward. Her sobriety date remains 5/23.    Family Program: Family present? No   Name of family member(s):   UDS collected: No Results:   AA/NA attended?: No  Sponsor?: No   Hunt Zajicek, LCAS

## 2015-01-20 ENCOUNTER — Other Ambulatory Visit (HOSPITAL_COMMUNITY): Payer: BLUE CROSS/BLUE SHIELD | Admitting: Licensed Clinical Social Worker

## 2015-01-20 DIAGNOSIS — F1020 Alcohol dependence, uncomplicated: Secondary | ICD-10-CM

## 2015-01-23 ENCOUNTER — Other Ambulatory Visit (HOSPITAL_COMMUNITY): Payer: BLUE CROSS/BLUE SHIELD | Admitting: Licensed Clinical Social Worker

## 2015-01-23 ENCOUNTER — Encounter (HOSPITAL_COMMUNITY): Payer: Self-pay | Admitting: Licensed Clinical Social Worker

## 2015-01-23 DIAGNOSIS — F329 Major depressive disorder, single episode, unspecified: Secondary | ICD-10-CM

## 2015-01-23 DIAGNOSIS — F1020 Alcohol dependence, uncomplicated: Secondary | ICD-10-CM

## 2015-01-23 NOTE — Progress Notes (Signed)
    Daily Group Progress Note  Program: CD-IOP   Group Time: 1-2:30  Participation Level: Active  Behavioral Response: Appropriate and Sharing  Type of Therapy: Process Group  Topic: After checking in with sobriety dates, group members took turns sharing about recovery-related activities and challenges they faced since the last group on Wednesday. They also shared any urges or cravings they may have experienced.  Group members were openly engaged in suggestions and feedback. A new patient joined the group and introduced himself. One group member graduated today from the program. The patient graduating received accolades from his fellow group members. His wife and son came to celebrate the graduation. Two group members saw program director, Darlyne Russian.   Group Time: 2:45-4  Participation Level: Active  Behavioral Response: Appropriate and Sharing  Type of Therapy: Psycho-education Group  Topic: The second half of group focused on styles of communication. Each patient was able to demonstrate through hands-on experience the relative effectiveness of communicating assertively, aggressively, passive-aggressively, and passively. Patients participated in a listening activity which is a major component of communication. All patients were engaged in all activities.      Summary: Patient reports she went to a "Big Book" meeting Wednesday night and "As Tawana Scale It" meeting Thursday night. She reports she is feeling happy and less tired since starting the group. She reports no urges or cravings. The patient participated in the communication activities and identified her style of communication as passive. She reported she is a people pleaser and feels comfortable in this style of communication, but was open in learning how to be assertive. Patient is still isolating and has not found a sponsor. Her sobriety date 5/23.   Family Program: Family present? No   Name of family member(s):   UDS  collected: No Results:   AA/NA attended?: YesThursday  Sponsor?: No   Julen Rubert S, Licensed Cli

## 2015-01-24 ENCOUNTER — Encounter (HOSPITAL_COMMUNITY): Payer: Self-pay | Admitting: Licensed Clinical Social Worker

## 2015-01-24 NOTE — Progress Notes (Signed)
    Daily Group Progress Note  Program: CD-IOP   Group Time: 1-2:30  Participation Level: Active  Behavioral Response: Appropriate and Sharing  Type of Therapy: Process Group  Topic: After checking in with sobriety dates, group members took turns sharing about recovery-related activities and challenges they faced since the last group. They also shared any urges or cravings they may have experienced.  Group members were openly engaged in suggestions and feedback. A new group member met with program director, Darlyne Russian, [place].   Group Time: 2;45-4  Participation Level: Active  Behavioral Response: Appropriate and Sharing  Type of Therapy: Psycho-education Group  Topic: The second half of group focused on Chemical Addiction and the family. Group members were able to acknowledge how their own addiction affected their family. Group members discovered emotions family members may have felt while they were in active addiction. Group members reported how the use of chemicals had dominated their thoughts, their time and their attention; and they didn't even realize it. There was good group discussion surrounding these emotions.     Summary: Patient proudly shared she went to 2 meetings at the [employer] this weekend and picked up 60 day chip from both meetings. Group members gave patient words of encouragement and congratulations on her accomplishment. Patient reported neither cravings nor urges over the weekend. Patient reported during her drinking periods she would hide her alcohol use from her 2 sons. She now feels that she may have caused her sons to feel rejected by her. She admits this is hard for her to think about. Patient is going to 4 meetings per week, does not have a sponsor or a home group. She is still isolating but not drinking. Her sobriety date is 5/23.   Family Program: Family present? No   Name of family member(Vaughn):   UDS collected: No Results:   AA/NA attended?:  YesSaturday and Sunday  Sponsor?: No   [name],[name], Licensed Cli

## 2015-01-25 ENCOUNTER — Other Ambulatory Visit (HOSPITAL_COMMUNITY): Payer: Self-pay | Admitting: Medical

## 2015-01-25 ENCOUNTER — Other Ambulatory Visit (HOSPITAL_COMMUNITY): Payer: BLUE CROSS/BLUE SHIELD | Admitting: Licensed Clinical Social Worker

## 2015-01-25 DIAGNOSIS — F10239 Alcohol dependence with withdrawal, unspecified: Secondary | ICD-10-CM

## 2015-01-25 DIAGNOSIS — F1020 Alcohol dependence, uncomplicated: Secondary | ICD-10-CM | POA: Diagnosis not present

## 2015-01-25 DIAGNOSIS — F1520 Other stimulant dependence, uncomplicated: Secondary | ICD-10-CM

## 2015-01-25 MED ORDER — ACAMPROSATE CALCIUM 333 MG PO TBEC: 666.0000 mg | DELAYED_RELEASE_TABLET | Freq: Three times a day (TID) | ORAL | Status: DC

## 2015-01-26 ENCOUNTER — Other Ambulatory Visit (HOSPITAL_COMMUNITY): Payer: BLUE CROSS/BLUE SHIELD | Admitting: Licensed Clinical Social Worker

## 2015-01-26 ENCOUNTER — Encounter (HOSPITAL_COMMUNITY): Payer: Self-pay | Admitting: Licensed Clinical Social Worker

## 2015-01-26 DIAGNOSIS — F329 Major depressive disorder, single episode, unspecified: Secondary | ICD-10-CM

## 2015-01-26 DIAGNOSIS — F32A Depression, unspecified: Secondary | ICD-10-CM

## 2015-01-26 DIAGNOSIS — F1020 Alcohol dependence, uncomplicated: Secondary | ICD-10-CM | POA: Diagnosis not present

## 2015-01-26 NOTE — Progress Notes (Signed)
This encounter was created in error - please disregard.  This encounter was created in error - please disregard.

## 2015-01-26 NOTE — Progress Notes (Signed)
    Daily Group Progress Note  Program: CD-IOP   Group Time: 1-2:30  Participation Level: Active  Behavioral Response: Appropriate and Sharing  Type of Therapy: Process Group  Topic: After checking in with sobriety dates, group members took turns sharing about recovery-related activities and challenges they faced since the last group. They also shared any urges or cravings they may have experienced.  Group members were openly engaged in suggestions and feedback. A new patient joined the group and introduced herself.    Group Time: 2:45-4  Participation Level: Active  Behavioral Response: Appropriate and Sharing  Type of Therapy: Psycho-education Group  Topic: The second part of group focused on trauma and substance abuse. A guest speaker, Dr. Luiz Ochoa, from [facility] presented through a power point how addiction predisposes people to higher rates of traumas. Inversely, people with trauma also have higher rates of addiction. Dr. Berenice Primas also discussed her foundation and what services are provided:  counseling, case management, skill-building groups, and co-occurring disorder counseling. She gave patients informational material on trauma.      Summary: Patient reported she has been to no meetings this week. She will go to a meeting tonight. She goes to most of her meetings on the weekend when she's not at work. Patient reported she still has not found anyone to be her sponsor but has a couple of women she is interested in asking. Patient reports her sons and people at work have noticed how much better she looks being alcohol free. It makes her feel good to hear this. Patient reported no cravings or urges to drink. Patient was quiet during the presentation on trauma. She has childhood trauma but she is not comfortable sharing it in the group. Her sobriety date is 5/23.   Family Program: Family present? No   Name of family member(s):   UDS collected: No Results:   AA/NA  attended?: Yes Tuesday  Sponsor?: No   Anders Hohmann S, Licensed Cli

## 2015-01-27 ENCOUNTER — Other Ambulatory Visit (HOSPITAL_COMMUNITY): Payer: BLUE CROSS/BLUE SHIELD

## 2015-01-27 ENCOUNTER — Encounter (HOSPITAL_COMMUNITY): Payer: Self-pay | Admitting: Licensed Clinical Social Worker

## 2015-01-27 NOTE — Progress Notes (Signed)
    Daily Group Progress Note  Program: CD-IOP   Group Time: 1-2:30  Participation Level: Active  Behavioral Response: Appropriate and Sharing  Type of Therapy: Process Group  Topic: After checking in with sobriety dates, group members took turns sharing about recovery-related activities and challenges they faced since the last group. They also shared any urges or cravings they may have experienced.  Group members were openly engaged in suggestions and feedback. One patient is struggling with early sobriety. He received good feedback and suggestions from other group members. Two patients were given prescriptions to go to [facility] for drug tests.    Group Time: 2:45-4  Participation Level: Active  Behavioral Response: Appropriate and Sharing  Type of Therapy: Psycho-education Group  Topic: The second half of group was psychoeducation. The focus was family roles in an addicted family. Each member learned about roles family members take on when there is addiction in the family to create balance in a dysfunctional family. The roles: addict, hero, mascot, lost child, scapegoat and enabler. Next, group members displayed a family structure portraying what their family looked like metaphorically when the patient was between the ages of 8-10. The segment was very powerful as patients explained, by sculpture, what they remember about their early childhood years. Lots of feelings were shown. There was open and honest conversation about family.     Summary: Patient went to a meeting Wednesday night. She asked about her Campral prescription and was told the prescription had been sent to her pharmacy. She confirmed it was ready at her pharmacy. Patient is struggling to complete her family structure. Both of her parents were alcoholics and she thinks it too painful to structure. Suggested to patient that over the weekend she think about one part of her childhood that may not be so painful.  Patient participated in other family structures and reported she has learned so much more about her fellow group members. Patient reported she will go to meetings this weekend. Her sobriety date is 5/23.   Family Program: Family present? No   Name of family member(s):   UDS collected: No Results:   AA/NA attended?: YesWednesday  Sponsor?: No   MACKENZIE,LISBETH S, Licensed Cli

## 2015-01-30 ENCOUNTER — Encounter: Payer: Self-pay | Admitting: Psychiatry

## 2015-01-30 ENCOUNTER — Other Ambulatory Visit (HOSPITAL_COMMUNITY): Payer: BLUE CROSS/BLUE SHIELD | Attending: Psychiatry | Admitting: Psychology

## 2015-01-30 ENCOUNTER — Encounter (HOSPITAL_COMMUNITY): Payer: Self-pay | Admitting: Psychology

## 2015-01-30 DIAGNOSIS — F4312 Post-traumatic stress disorder, chronic: Secondary | ICD-10-CM | POA: Diagnosis not present

## 2015-01-30 DIAGNOSIS — Z6372 Alcoholism and drug addiction in family: Secondary | ICD-10-CM | POA: Insufficient documentation

## 2015-01-30 DIAGNOSIS — F1020 Alcohol dependence, uncomplicated: Secondary | ICD-10-CM | POA: Insufficient documentation

## 2015-01-30 DIAGNOSIS — F17210 Nicotine dependence, cigarettes, uncomplicated: Secondary | ICD-10-CM | POA: Diagnosis not present

## 2015-02-01 ENCOUNTER — Other Ambulatory Visit (HOSPITAL_COMMUNITY): Payer: BLUE CROSS/BLUE SHIELD

## 2015-02-01 ENCOUNTER — Encounter (HOSPITAL_COMMUNITY): Payer: Self-pay | Admitting: Psychology
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
## 2015-02-02 ENCOUNTER — Other Ambulatory Visit: Payer: Self-pay

## 2015-02-02 DIAGNOSIS — Z1231 Encounter for screening mammogram for malignant neoplasm of breast: Secondary | ICD-10-CM

## 2015-02-03 ENCOUNTER — Other Ambulatory Visit (HOSPITAL_COMMUNITY): Payer: BLUE CROSS/BLUE SHIELD

## 2015-02-03 NOTE — Progress Notes (Signed)
[name] is a 59 y.o. female patient. CD-IOP: Successful Graduation from the Program. The patient successfully graduated from the program today. Please see discharge documentation in Epic, 02/01/15.        Ovie Eastep, LCAS

## 2015-02-03 NOTE — Progress Notes (Signed)
[name] is a 59 y.o. female patient. CD-IOP: Treatment Plan Update. I met with the patient this afternoon for our individual counseling session. We had missed our session over the past 2 weeks due to my vacation and a cancellation on her part. She reported that she was ready to complete the program and wanted to graduate on Wednesday. The patient had spoken with my co-therapist last week and had explained that she needed to get back to work full-time and had attended 18 group sessions and felt she was ready to go. The patient reported that the program had been very helpful and gotten her back into AA. She had also felt very supported and cared for by her fellow group members. I agreed that it would be okay for her to graduate. She knows what she needs to do, but I reminded her that should she return to a life of isolation and withdraw she will most surely go back to drinking. The patient assured me that she would not be doing that and felt really good about herself at this time. I explained the need for a treatment plan update and we reviewed her goals to date. She has continued to make excellent progress in her primary goal of sobriety. The patient has not had anything to drink since before beginning the program and her sobriety date remains 5/23. Her second goal revolves around support for her recovery. She has returned to [place] and gone to a number of meetings and met other women in recovery. Despite my urging, she has not secured a sponsor. The patient's third goal was to build up her low self-esteem and reduce her depressive symptoms. The patient reported she feels much more hopeful and motivated and as she has progressed in treatment, her appearance has improved markedly and she is dressing better and spending more time on how she looks. Group members have pointed this out in sessions. The patient has made good progress on all three of her identified goals of treatment and has done well in the program.  She will be graduating a little sooner than I had anticipated, but she has work responsibilities and has learned about as much as she can while enrolled. Her sobriety date remains 5/23.         Taijah Macrae, LCAS

## 2015-02-03 NOTE — Progress Notes (Signed)
    Daily Group Progress Note  Program: CD-IOP   Group Time: 1-2:30 pm  Participation Level: Active  Behavioral Response: Appropriate and Sharing  Type of Therapy: Process Group  Topic: Process: the first part of group was spent in process. Members shared about the issues, concerns and challenges they are facing in early recovery. Included in their disclosures were the things they did since the last group session to support their recovery and contribute to a more balanced and healthy life style.   Group Time: 2:45- 4pm  Participation Level: Active  Behavioral Response: Appropriate and Sharing  Type of Therapy: Psycho-education Group  Topic: Body/Mind/Spirit: what do you do to feed your body, mind and spirit/Graduation. The second half of group was spent in a psycho-ed. A handout was provided and members were asked to rate how well they address and take care of their bodies, minds and spirits. The group discussed their rating and where they were weak or poor and what they might do to begin to improve these areas. At the conclusion of group today, a graduation ceremony was held to honor a member who was completing the program toady. After brownies, kind words were shared as the medallion was passed around and members shared their hopes with the member. The patient appeared very touched and grateful to her fellow group members for their support and kindness.   Summary: The patient reported she had worked all day yesterday, but had a bad headache when she got home. She admitted she had not attended any AA meetings since our last group session. She agreed that after she graduates she will have to make herself go to meetings. She pointed out that now that her son has gotten his driver's license, she is free to go to meetings or the gym after work. She shared her scores on the handouts and admitted she did better on the body than on the spirit or mind. The patient was very touched by the kind  words shared with her during the graduation ceremony. She expressed appreciation and became teary when she talked about how much she cared about each of the group members. She assured them that she would see them in meetings. The patient completes the program successfully and graduates today. She responded well to this intervention.   Family Program: Family present? No   Name of family member(s):   UDS collected: No Results:  AA/NA attended?: No  Sponsor?: No   [name], LCAS

## 2015-02-06 ENCOUNTER — Other Ambulatory Visit (HOSPITAL_COMMUNITY): Payer: BLUE CROSS/BLUE SHIELD

## 2015-02-06 NOTE — Progress Notes (Signed)
  [facility] Discharge Summary   KASSI ESTEVE [id number]  Date of Admission: 12/21/2014 Date of Discharge: 02/01/2015  Course of Treatment: Pt attended 18 group sessions and 9 individual counseling sessions over the course of her treatment in the CD-IOP.She remained drug and alcohol free during the program as verified by random UDS.On August 3rd Group celebrated with Brownies after their meeting and pt was discharged.  Goals and Activities to Help Maintain Sobriety: 1. Stay away from old friends who continue to drink and use mind-altering chemicals. 2. Continue practicing Fair Fighting rules in interpersonal conflicts. 3. Continue alcohol and drug refusal skills and call on support systems. 4. Continue to use the 12 steps as tools for practicing principles of recovery in all area of life  Referrals: [employer]   Aftercare services: [facility] IOP Aftercare Wednesday 5:30 [facility] [place] [place] [facility]  1. Attend AA 90 meetings in 90 days. 2. Obtain a sponsor and a home group in [place]                         . 3. Return to PCP as requested for medication management  Next appointment: as scheduled with PCP  Plan of Action to Address Continuing Problems: As above    Client has NOT participated in the development of this discharge plan but will has receive a copy of this completed plan  Darlyne Russian  02/06/2015    02/06/2015

## 2015-02-07 ENCOUNTER — Ambulatory Visit
Admission: RE | Admit: 2015-02-07 | Discharge: 2015-02-07 | Disposition: A | Discharge status: Home / Self Care | Payer: BLUE CROSS/BLUE SHIELD | Source: Ambulatory Visit

## 2015-02-07 DIAGNOSIS — Z1231 Encounter for screening mammogram for malignant neoplasm of breast: Secondary | ICD-10-CM

## 2015-02-08 ENCOUNTER — Other Ambulatory Visit (HOSPITAL_COMMUNITY): Payer: BLUE CROSS/BLUE SHIELD

## 2015-02-10 ENCOUNTER — Other Ambulatory Visit (HOSPITAL_COMMUNITY): Payer: BLUE CROSS/BLUE SHIELD

## 2015-02-13 ENCOUNTER — Other Ambulatory Visit (HOSPITAL_COMMUNITY): Payer: BLUE CROSS/BLUE SHIELD

## 2015-02-15 ENCOUNTER — Other Ambulatory Visit (HOSPITAL_COMMUNITY): Payer: BLUE CROSS/BLUE SHIELD

## 2015-02-17 ENCOUNTER — Other Ambulatory Visit (HOSPITAL_COMMUNITY): Payer: BLUE CROSS/BLUE SHIELD

## 2015-02-20 ENCOUNTER — Other Ambulatory Visit (HOSPITAL_COMMUNITY): Payer: BLUE CROSS/BLUE SHIELD

## 2015-02-22 ENCOUNTER — Other Ambulatory Visit (HOSPITAL_COMMUNITY): Payer: BLUE CROSS/BLUE SHIELD

## 2015-02-24 ENCOUNTER — Other Ambulatory Visit (HOSPITAL_COMMUNITY): Payer: BLUE CROSS/BLUE SHIELD

## 2015-02-27 ENCOUNTER — Other Ambulatory Visit (HOSPITAL_COMMUNITY): Payer: BLUE CROSS/BLUE SHIELD

## 2015-03-01 ENCOUNTER — Other Ambulatory Visit (HOSPITAL_COMMUNITY): Payer: BLUE CROSS/BLUE SHIELD

## 2015-03-03 ENCOUNTER — Other Ambulatory Visit (HOSPITAL_COMMUNITY): Payer: BLUE CROSS/BLUE SHIELD

## 2015-03-08 ENCOUNTER — Other Ambulatory Visit (HOSPITAL_COMMUNITY): Payer: BLUE CROSS/BLUE SHIELD

## 2015-03-10 ENCOUNTER — Other Ambulatory Visit (HOSPITAL_COMMUNITY): Payer: BLUE CROSS/BLUE SHIELD

## 2015-03-13 ENCOUNTER — Other Ambulatory Visit (HOSPITAL_COMMUNITY): Payer: BLUE CROSS/BLUE SHIELD

## 2015-03-15 ENCOUNTER — Other Ambulatory Visit (HOSPITAL_COMMUNITY): Payer: BLUE CROSS/BLUE SHIELD

## 2015-03-17 ENCOUNTER — Other Ambulatory Visit (HOSPITAL_COMMUNITY): Payer: BLUE CROSS/BLUE SHIELD

## 2015-03-20 ENCOUNTER — Other Ambulatory Visit (HOSPITAL_COMMUNITY): Payer: BLUE CROSS/BLUE SHIELD

## 2015-03-22 ENCOUNTER — Other Ambulatory Visit (HOSPITAL_COMMUNITY): Payer: BLUE CROSS/BLUE SHIELD

## 2015-09-08 ENCOUNTER — Emergency Department (HOSPITAL_COMMUNITY): Payer: BLUE CROSS/BLUE SHIELD

## 2015-09-08 ENCOUNTER — Emergency Department (HOSPITAL_COMMUNITY)
Admission: EM | Admit: 2015-09-08 | Discharge: 2015-09-08 | Disposition: A | Discharge status: Home / Self Care | Payer: BLUE CROSS/BLUE SHIELD | Attending: Emergency Medicine | Admitting: Emergency Medicine

## 2015-09-08 DIAGNOSIS — W540XXA Bitten by dog, initial encounter: Secondary | ICD-10-CM | POA: Insufficient documentation

## 2015-09-08 DIAGNOSIS — S51811A Laceration without foreign body of right forearm, initial encounter: Secondary | ICD-10-CM | POA: Diagnosis not present

## 2015-09-08 DIAGNOSIS — Y9389 Activity, other specified: Secondary | ICD-10-CM | POA: Diagnosis not present

## 2015-09-08 DIAGNOSIS — Y998 Other external cause status: Secondary | ICD-10-CM | POA: Insufficient documentation

## 2015-09-08 DIAGNOSIS — Z79899 Other long term (current) drug therapy: Secondary | ICD-10-CM | POA: Diagnosis not present

## 2015-09-08 DIAGNOSIS — F4312 Post-traumatic stress disorder, chronic: Secondary | ICD-10-CM | POA: Diagnosis not present

## 2015-09-08 DIAGNOSIS — Z23 Encounter for immunization: Secondary | ICD-10-CM | POA: Diagnosis not present

## 2015-09-08 DIAGNOSIS — F17210 Nicotine dependence, cigarettes, uncomplicated: Secondary | ICD-10-CM | POA: Insufficient documentation

## 2015-09-08 DIAGNOSIS — I10 Essential (primary) hypertension: Secondary | ICD-10-CM | POA: Insufficient documentation

## 2015-09-08 DIAGNOSIS — Y9289 Other specified places as the place of occurrence of the external cause: Secondary | ICD-10-CM | POA: Diagnosis not present

## 2015-09-08 DIAGNOSIS — F329 Major depressive disorder, single episode, unspecified: Secondary | ICD-10-CM | POA: Insufficient documentation

## 2015-09-08 DIAGNOSIS — S59911A Unspecified injury of right forearm, initial encounter: Secondary | ICD-10-CM | POA: Diagnosis present

## 2015-09-08 MED ORDER — AMOXICILLIN-POT CLAVULANATE 875-125 MG PO TABS: 1.0000 | ORAL_TABLET | Freq: Two times a day (BID) | ORAL | Status: DC

## 2015-09-08 MED ORDER — TETANUS-DIPHTH-ACELL PERTUSSIS 5-2.5-18.5 LF-MCG/0.5 IM SUSP
0.5000 mL | Freq: Once | INTRAMUSCULAR | Status: AC
  Administered 2015-09-08: 0.5 mL via INTRAMUSCULAR
  Filled 2015-09-08: qty 0.5

## 2015-09-08 MED ORDER — LIDOCAINE-EPINEPHRINE (PF) 2 %-1:200000 IJ SOLN
20.0000 mL | Freq: Once | INTRAMUSCULAR | Status: AC
  Administered 2015-09-08: 20 mL
  Filled 2015-09-08: qty 20
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
## 2015-09-08 NOTE — ED Notes (Signed)
Pt reports to the ED for eval of dog bite to right arm. Several lacerations to the right forearm. Bleeding controlled. Pt unaware if patient is up to date on vaccines but the dog was taken to the animal shelter. Pt A&Ox4, resp e/u, and skin warm and dry.

## 2015-09-08 NOTE — Discharge Instructions (Signed)
Ms. [name],  Nice meeting you! Please follow-up with your primary care provider to have sutures removed in 10-14 days. Return to the emergency department if you develop fevers, chills, streaking around the sites, yellow-green drainage from the areas, pain not resolved with ibuprofen or Tylenol. Feel better soon!  S. Wendie Simmer, PA-C

## 2016-02-14 DIAGNOSIS — R509 Fever, unspecified: Secondary | ICD-10-CM | POA: Diagnosis not present

## 2016-02-14 DIAGNOSIS — N390 Urinary tract infection, site not specified: Secondary | ICD-10-CM | POA: Diagnosis not present

## 2016-02-14 DIAGNOSIS — J069 Acute upper respiratory infection, unspecified: Secondary | ICD-10-CM | POA: Diagnosis not present

## 2016-02-14 DIAGNOSIS — Z13228 Encounter for screening for other metabolic disorders: Secondary | ICD-10-CM | POA: Diagnosis not present

## 2016-04-09 ENCOUNTER — Other Ambulatory Visit: Payer: Self-pay | Admitting: Obstetrics and Gynecology

## 2016-04-09 DIAGNOSIS — Z1231 Encounter for screening mammogram for malignant neoplasm of breast: Secondary | ICD-10-CM

## 2016-04-18 DIAGNOSIS — Z23 Encounter for immunization: Secondary | ICD-10-CM | POA: Diagnosis not present

## 2016-04-25 ENCOUNTER — Ambulatory Visit
Admission: RE | Admit: 2016-04-25 | Discharge: 2016-04-25 | Disposition: A | Discharge status: Home / Self Care | Payer: BLUE CROSS/BLUE SHIELD | Source: Ambulatory Visit | Attending: Obstetrics and Gynecology | Admitting: Obstetrics and Gynecology

## 2016-04-25 DIAGNOSIS — Z1231 Encounter for screening mammogram for malignant neoplasm of breast: Secondary | ICD-10-CM | POA: Diagnosis not present

## 2016-07-02 DIAGNOSIS — I10 Essential (primary) hypertension: Secondary | ICD-10-CM | POA: Diagnosis not present

## 2016-07-02 DIAGNOSIS — F329 Major depressive disorder, single episode, unspecified: Secondary | ICD-10-CM | POA: Diagnosis not present

## 2016-07-02 DIAGNOSIS — R946 Abnormal results of thyroid function studies: Secondary | ICD-10-CM | POA: Diagnosis not present

## 2016-07-02 DIAGNOSIS — E785 Hyperlipidemia, unspecified: Secondary | ICD-10-CM | POA: Diagnosis not present

## 2016-08-08 DIAGNOSIS — E039 Hypothyroidism, unspecified: Secondary | ICD-10-CM | POA: Diagnosis not present

## 2016-09-12 DIAGNOSIS — J111 Influenza due to unidentified influenza virus with other respiratory manifestations: Secondary | ICD-10-CM | POA: Diagnosis not present

## 2016-09-12 DIAGNOSIS — J00 Acute nasopharyngitis [common cold]: Secondary | ICD-10-CM | POA: Diagnosis not present

## 2016-12-30 DIAGNOSIS — I10 Essential (primary) hypertension: Secondary | ICD-10-CM | POA: Diagnosis not present

## 2016-12-30 DIAGNOSIS — E785 Hyperlipidemia, unspecified: Secondary | ICD-10-CM | POA: Diagnosis not present

## 2016-12-30 DIAGNOSIS — L249 Irritant contact dermatitis, unspecified cause: Secondary | ICD-10-CM | POA: Diagnosis not present

## 2016-12-30 DIAGNOSIS — F324 Major depressive disorder, single episode, in partial remission: Secondary | ICD-10-CM | POA: Diagnosis not present

## 2017-01-23 DIAGNOSIS — E785 Hyperlipidemia, unspecified: Secondary | ICD-10-CM | POA: Diagnosis not present

## 2017-02-03 DIAGNOSIS — M25551 Pain in right hip: Secondary | ICD-10-CM | POA: Diagnosis not present

## 2017-02-03 DIAGNOSIS — M545 Low back pain: Secondary | ICD-10-CM | POA: Diagnosis not present

## 2017-04-18 DIAGNOSIS — Z23 Encounter for immunization: Secondary | ICD-10-CM | POA: Diagnosis not present

## 2017-06-13 ENCOUNTER — Other Ambulatory Visit: Payer: Self-pay | Admitting: Obstetrics and Gynecology

## 2017-06-13 DIAGNOSIS — Z1231 Encounter for screening mammogram for malignant neoplasm of breast: Secondary | ICD-10-CM

## 2017-07-14 ENCOUNTER — Ambulatory Visit
Admission: RE | Admit: 2017-07-14 | Discharge: 2017-07-14 | Disposition: A | Discharge status: Home / Self Care | Payer: BLUE CROSS/BLUE SHIELD | Source: Ambulatory Visit | Attending: Obstetrics and Gynecology | Admitting: Obstetrics and Gynecology

## 2017-07-14 DIAGNOSIS — Z1231 Encounter for screening mammogram for malignant neoplasm of breast: Secondary | ICD-10-CM | POA: Diagnosis not present

## 2017-10-02 DIAGNOSIS — I10 Essential (primary) hypertension: Secondary | ICD-10-CM | POA: Diagnosis not present

## 2017-10-02 DIAGNOSIS — E7800 Pure hypercholesterolemia, unspecified: Secondary | ICD-10-CM | POA: Diagnosis not present

## 2017-10-02 DIAGNOSIS — F324 Major depressive disorder, single episode, in partial remission: Secondary | ICD-10-CM | POA: Diagnosis not present

## 2017-11-05 DIAGNOSIS — J069 Acute upper respiratory infection, unspecified: Secondary | ICD-10-CM | POA: Diagnosis not present

## 2017-12-09 DIAGNOSIS — L03115 Cellulitis of right lower limb: Secondary | ICD-10-CM | POA: Diagnosis not present

## 2018-01-19 DIAGNOSIS — M79644 Pain in right finger(s): Secondary | ICD-10-CM | POA: Diagnosis not present

## 2018-03-23 DIAGNOSIS — M545 Low back pain: Secondary | ICD-10-CM | POA: Diagnosis not present

## 2018-05-27 ENCOUNTER — Other Ambulatory Visit: Payer: Self-pay | Admitting: Family Medicine

## 2018-05-27 DIAGNOSIS — N644 Mastodynia: Secondary | ICD-10-CM

## 2018-06-01 ENCOUNTER — Ambulatory Visit
Admission: RE | Admit: 2018-06-01 | Discharge: 2018-06-01 | Disposition: A | Discharge status: Home / Self Care | Payer: BLUE CROSS/BLUE SHIELD | Source: Ambulatory Visit | Attending: Family Medicine | Admitting: Family Medicine

## 2018-06-01 DIAGNOSIS — N644 Mastodynia: Secondary | ICD-10-CM

## 2018-06-01 DIAGNOSIS — R928 Other abnormal and inconclusive findings on diagnostic imaging of breast: Secondary | ICD-10-CM | POA: Diagnosis not present

## 2018-07-21 ENCOUNTER — Other Ambulatory Visit: Payer: Self-pay | Admitting: Obstetrics and Gynecology

## 2018-07-21 DIAGNOSIS — Z1231 Encounter for screening mammogram for malignant neoplasm of breast: Secondary | ICD-10-CM

## 2018-08-17 ENCOUNTER — Ambulatory Visit
Admission: RE | Admit: 2018-08-17 | Discharge: 2018-08-17 | Disposition: A | Discharge status: Home / Self Care | Payer: BLUE CROSS/BLUE SHIELD | Source: Ambulatory Visit | Attending: Obstetrics and Gynecology | Admitting: Obstetrics and Gynecology

## 2018-08-17 DIAGNOSIS — Z1231 Encounter for screening mammogram for malignant neoplasm of breast: Secondary | ICD-10-CM | POA: Diagnosis not present

## 2018-11-19 DIAGNOSIS — Z113 Encounter for screening for infections with a predominantly sexual mode of transmission: Secondary | ICD-10-CM | POA: Diagnosis not present

## 2018-11-19 DIAGNOSIS — Z130 Encounter for screening for diseases of the blood and blood-forming organs and certain disorders involving the immune mechanism: Secondary | ICD-10-CM | POA: Diagnosis not present

## 2018-11-19 DIAGNOSIS — Z01419 Encounter for gynecological examination (general) (routine) without abnormal findings: Secondary | ICD-10-CM | POA: Diagnosis not present

## 2018-11-19 DIAGNOSIS — Z6828 Body mass index (BMI) 28.0-28.9, adult: Secondary | ICD-10-CM | POA: Diagnosis not present

## 2018-11-19 DIAGNOSIS — Z124 Encounter for screening for malignant neoplasm of cervix: Secondary | ICD-10-CM | POA: Diagnosis not present

## 2018-11-20 DIAGNOSIS — Z124 Encounter for screening for malignant neoplasm of cervix: Secondary | ICD-10-CM | POA: Diagnosis not present

## 2018-12-02 ENCOUNTER — Encounter: Payer: Self-pay | Admitting: Internal Medicine

## 2018-12-28 ENCOUNTER — Ambulatory Visit: Payer: BC Managed Care – PPO | Admitting: *Deleted

## 2018-12-28 ENCOUNTER — Other Ambulatory Visit: Payer: Self-pay

## 2018-12-28 VITALS — Ht 61.0 in | Wt 152.0 lb

## 2018-12-28 DIAGNOSIS — Z1211 Encounter for screening for malignant neoplasm of colon: Secondary | ICD-10-CM

## 2018-12-28 MED ORDER — NA SULFATE-K SULFATE-MG SULF 17.5-3.13-1.6 GM/177ML PO SOLN: ORAL | 0 refills | Status: DC

## 2018-12-28 NOTE — Progress Notes (Signed)
Patient's pre-visit was done today over the phone with the patient due to COVID-19 pandemic. Name,DOB and address verified. Insurance verified. Packet of Prep instructions mailed to patient including copy of a consent form and pre-procedure patient acknowledgement form-pt is aware. Suprep PMN $50 Coupon included. Patient understands to call us back with any questions or concerns. Patient denies any allergies to eggs or soy. Patient denies any problems with anesthesia/sedation. Patient denies any oxygen use at home. Patient denies taking any diet/weight loss medications or blood thinners. EMMI education assisgned to patient on colonoscopy, this was explained and instructions given to patient. Pt is aware that care partner will wait in the car in the parking lot; if they feel like they will be too hot to wait in the car; they may wait in the lobby.  We want them to wear a mask (we do not have any that we can provide them), practice social distancing, and we will check their temperatures when they get here.  I did remind patient that their care partner needs to stay in the parking lot the entire time. Pt will wear mask into building

## 2018-12-30 ENCOUNTER — Encounter: Payer: Self-pay | Admitting: Internal Medicine

## 2019-01-08 ENCOUNTER — Telehealth: Payer: Self-pay | Admitting: Internal Medicine

## 2019-01-08 NOTE — Telephone Encounter (Signed)
Pt responded "no" to all screening questions °

## 2019-01-08 NOTE — Telephone Encounter (Signed)
Left message to call back to ask Covid-19 screening questions. Covid-19 Screening Questions:  Do you now or have you had a fever in the last 14 days? no  Do you have any respiratory symptoms of shortness of breath or cough now or in the last 14 days? no  Do you have any family members or close contacts with diagnosed or suspected Covid-19 in the past 14 days? no  Have you been tested for Covid-19 and found to be positive? no

## 2019-01-11 ENCOUNTER — Ambulatory Visit (AMBULATORY_SURGERY_CENTER): Payer: BC Managed Care – PPO | Admitting: Internal Medicine

## 2019-01-11 ENCOUNTER — Encounter: Payer: Self-pay | Admitting: Internal Medicine

## 2019-01-11 ENCOUNTER — Other Ambulatory Visit: Payer: Self-pay

## 2019-01-11 VITALS — BP 108/60 | HR 75 | Temp 98.8°F | Resp 15 | Ht 61.0 in | Wt 152.0 lb

## 2019-01-11 DIAGNOSIS — Z1211 Encounter for screening for malignant neoplasm of colon: Secondary | ICD-10-CM | POA: Diagnosis not present

## 2019-01-11 DIAGNOSIS — D123 Benign neoplasm of transverse colon: Secondary | ICD-10-CM

## 2019-01-11 MED ORDER — SODIUM CHLORIDE 0.9 % IV SOLN: 500.0000 mL | Freq: Once | INTRAVENOUS | Status: DC

## 2019-01-11 NOTE — Progress Notes (Signed)
PT taken to PACU. Monitors in place. VSS. Report given to RN. 

## 2019-01-11 NOTE — Progress Notes (Signed)
Pt's states no medical or surgical changes since previsit or office visit.  [place]

## 2019-01-11 NOTE — Patient Instructions (Signed)
YOU HAD AN ENDOSCOPIC PROCEDURE TODAY AT THE [facility]:   Refer to the procedure report that was given to you for any specific questions about what was found during the examination.  If the procedure report does not answer your questions, please call your gastroenterologist to clarify.  If you requested that your care partner not be given the details of your procedure findings, then the procedure report has been included in a sealed envelope for you to review at your convenience later.  YOU SHOULD EXPECT: Some feelings of bloating in the abdomen. Passage of more gas than usual.  Walking can help get rid of the air that was put into your GI tract during the procedure and reduce the bloating. If you had a lower endoscopy (such as a colonoscopy or flexible sigmoidoscopy) you may notice spotting of blood in your stool or on the toilet paper. If you underwent a bowel prep for your procedure, you may not have a normal bowel movement for a few days.  Please Note:  You might notice some irritation and congestion in your nose or some drainage.  This is from the oxygen used during your procedure.  There is no need for concern and it should clear up in a day or so.  SYMPTOMS TO REPORT IMMEDIATELY:   Following lower endoscopy (colonoscopy or flexible sigmoidoscopy):  Excessive amounts of blood in the stool  Significant tenderness or worsening of abdominal pains  Swelling of the abdomen that is new, acute  Fever of 100F or higher   For urgent or emergent issues, a gastroenterologist can be reached at any hour by calling [phone].   DIET:  We do recommend a small meal at first, but then you may proceed to your regular diet.  Drink plenty of fluids but you should avoid alcoholic beverages for 24 hours.  ACTIVITY:  You should plan to take it easy for the rest of today and you should NOT DRIVE or use heavy machinery until tomorrow (because of the sedation medicines used during the test).     FOLLOW UP: Our staff will call the number listed on your records 48-72 hours following your procedure to check on you and address any questions or concerns that you may have regarding the information given to you following your procedure. If we do not reach you, we will leave a message.  We will attempt to reach you two times.  During this call, we will ask if you have developed any symptoms of COVID 19. If you develop any symptoms (ie: fever, flu-like symptoms, shortness of breath, cough etc.) before then, please call [phone].  If you test positive for Covid 19 in the 2 weeks post procedure, please call and report this information to us.    If any biopsies were taken you will be contacted by phone or by letter within the next 1-3 weeks.  Please call us at [phone] if you have not heard about the biopsies in 3 weeks.    SIGNATURES/CONFIDENTIALITY: You and/or your care partner have signed paperwork which will be entered into your electronic medical record.  These signatures attest to the fact that that the information above on your After Visit Summary has been reviewed and is understood.  Full responsibility of the confidentiality of this discharge information lies with you and/or your care-partner.    Handouts were given to you on polyps and diverticulosis. You may resume your current medications today. Await biopsy results. Please call if any questions   or concerns.   

## 2019-01-11 NOTE — Op Note (Signed)
[facility] Patient Name: [name] Procedure Date: 01/11/2019 9:58 AM MRN: [id number] Endoscopist: Docia Chuck. Henrene Pastor , MD Age: 63 Referring MD:  Date of Birth: 1955-11-06 Gender: Female Account #: [id number] Procedure:                Colonoscopy with cold snare polypectomy x 1 Indications:              Screening for colorectal malignant neoplasm.                            Previous examination March 2006 was negative for                            neoplasia Medicines:                Monitored Anesthesia Care Procedure:                Pre-Anesthesia Assessment:                           - Prior to the procedure, a History and Physical                            was performed, and patient medications and                            allergies were reviewed. The patient's tolerance of                            previous anesthesia was also reviewed. The risks                            and benefits of the procedure and the sedation                            options and risks were discussed with the patient.                            All questions were answered, and informed consent                            was obtained. Prior Anticoagulants: The patient has                            taken no previous anticoagulant or antiplatelet                            agents. ASA Grade Assessment: II - A patient with                            mild systemic disease. After reviewing the risks                            and benefits, the patient was deemed in  satisfactory condition to undergo the procedure.                           After obtaining informed consent, the colonoscope                            was passed under direct vision. Throughout the                            procedure, the patient's blood pressure, pulse, and                            oxygen saturations were monitored continuously. The                            Colonoscope was  introduced through the anus and                            advanced to the the cecum, identified by                            appendiceal orifice and ileocecal valve. The                            ileocecal valve, appendiceal orifice, and rectum                            were photographed. The quality of the bowel                            preparation was excellent. The colonoscopy was                            performed without difficulty. The patient tolerated                            the procedure well. The bowel preparation used was                            SUPREP via split dose instruction. Scope In: 10:04:26 AM Scope Out: 10:18:55 AM Scope Withdrawal Time: 0 hours 10 minutes 17 seconds  Total Procedure Duration: 0 hours 14 minutes 29 seconds  Findings:                 A 2 mm polyp was found in the transverse colon. The                            polyp was removed with a cold snare. Resection and                            retrieval were complete.                           Multiple small and large-mouthed diverticula were  found in the sigmoid colon and right colon.                           The exam was otherwise without abnormality on                            direct and retroflexion views. Complications:            No immediate complications. Estimated blood loss:                            None. Estimated Blood Loss:     Estimated blood loss: none. Impression:               - One 2 mm polyp in the transverse colon, removed                            with a cold snare. Resected and retrieved.                           - Diverticulosis in the sigmoid colon and in the                            right colon.                           - The examination was otherwise normal on direct                            and retroflexion views. Recommendation:           - Repeat colonoscopy in 7-10 years for surveillance.                           - Patient  has a contact number available for                            emergencies. The signs and symptoms of potential                            delayed complications were discussed with the                            patient. Return to normal activities tomorrow.                            Written discharge instructions were provided to the                            patient.                           - Resume previous diet.                           - Continue present medications.                           -  Await pathology results. Docia Chuck. Henrene Pastor, MD 01/11/2019 10:24:33 AM This report has been signed electronically.

## 2019-01-11 NOTE — Progress Notes (Signed)
Called to room to assist during endoscopic procedure.  Patient ID and intended procedure confirmed with present staff. Received instructions for my participation in the procedure from the performing physician.  

## 2019-01-11 NOTE — Progress Notes (Signed)
No problems noted in the recovery room. maw 

## 2019-01-13 ENCOUNTER — Encounter: Payer: Self-pay | Admitting: Internal Medicine

## 2019-01-13 ENCOUNTER — Telehealth: Payer: Self-pay

## 2019-01-13 NOTE — Telephone Encounter (Signed)
  Follow up Call-  Call back number 01/11/2019  Post procedure Call Back phone  # [phone]  Permission to leave phone message Yes  Some recent data might be hidden     Patient questions:  Do you have a fever, pain , or abdominal swelling? No. Pain Score  0 *  Have you tolerated food without any problems? Yes.    Have you been able to return to your normal activities? Yes.    Do you have any questions about your discharge instructions: Diet   No. Medications  No. Follow up visit  No.  Do you have questions or concerns about your Care? No.  Actions: * If pain score is 4 or above: No action needed, pain <4.  1. Have you developed a fever since your procedure? no  2.   Have you had an respiratory symptoms (SOB or cough) since your procedure? no  3.   Have you tested positive for COVID 19 since your procedure no  4.   Have you had any family members/close contacts diagnosed with the COVID 19 since your procedure?  no   If yes to any of these questions please route to Joylene John, RN and Alphonsa Gin, Therapist, sports.

## 2019-01-27 DIAGNOSIS — B349 Viral infection, unspecified: Secondary | ICD-10-CM | POA: Diagnosis not present

## 2019-01-27 DIAGNOSIS — J069 Acute upper respiratory infection, unspecified: Secondary | ICD-10-CM | POA: Diagnosis not present

## 2019-01-27 DIAGNOSIS — Z720 Tobacco use: Secondary | ICD-10-CM | POA: Diagnosis not present

## 2019-03-11 DIAGNOSIS — H6121 Impacted cerumen, right ear: Secondary | ICD-10-CM | POA: Diagnosis not present

## 2019-08-06 ENCOUNTER — Other Ambulatory Visit: Payer: Self-pay | Admitting: Obstetrics and Gynecology

## 2019-08-06 DIAGNOSIS — Z1231 Encounter for screening mammogram for malignant neoplasm of breast: Secondary | ICD-10-CM

## 2019-09-06 DIAGNOSIS — F324 Major depressive disorder, single episode, in partial remission: Secondary | ICD-10-CM | POA: Diagnosis not present

## 2019-09-06 DIAGNOSIS — I10 Essential (primary) hypertension: Secondary | ICD-10-CM | POA: Diagnosis not present

## 2019-09-06 DIAGNOSIS — E7800 Pure hypercholesterolemia, unspecified: Secondary | ICD-10-CM | POA: Diagnosis not present

## 2019-09-06 DIAGNOSIS — R3129 Other microscopic hematuria: Secondary | ICD-10-CM | POA: Diagnosis not present

## 2019-09-06 DIAGNOSIS — E039 Hypothyroidism, unspecified: Secondary | ICD-10-CM | POA: Diagnosis not present

## 2019-09-08 ENCOUNTER — Other Ambulatory Visit: Payer: Self-pay

## 2019-09-08 ENCOUNTER — Ambulatory Visit
Admission: RE | Admit: 2019-09-08 | Discharge: 2019-09-08 | Disposition: A | Discharge status: Home / Self Care | Payer: BC Managed Care – PPO | Source: Ambulatory Visit | Attending: Obstetrics and Gynecology | Admitting: Obstetrics and Gynecology

## 2019-09-08 DIAGNOSIS — Z1231 Encounter for screening mammogram for malignant neoplasm of breast: Secondary | ICD-10-CM | POA: Diagnosis not present

## 2019-09-15 DIAGNOSIS — H35033 Hypertensive retinopathy, bilateral: Secondary | ICD-10-CM | POA: Diagnosis not present

## 2019-09-22 DIAGNOSIS — R739 Hyperglycemia, unspecified: Secondary | ICD-10-CM | POA: Diagnosis not present

## 2019-11-24 DIAGNOSIS — Z01419 Encounter for gynecological examination (general) (routine) without abnormal findings: Secondary | ICD-10-CM | POA: Diagnosis not present

## 2019-11-24 DIAGNOSIS — R319 Hematuria, unspecified: Secondary | ICD-10-CM | POA: Diagnosis not present

## 2019-11-24 DIAGNOSIS — Z130 Encounter for screening for diseases of the blood and blood-forming organs and certain disorders involving the immune mechanism: Secondary | ICD-10-CM | POA: Diagnosis not present

## 2019-11-24 DIAGNOSIS — Z1389 Encounter for screening for other disorder: Secondary | ICD-10-CM | POA: Diagnosis not present

## 2019-11-24 DIAGNOSIS — Z6827 Body mass index (BMI) 27.0-27.9, adult: Secondary | ICD-10-CM | POA: Diagnosis not present

## 2019-11-24 DIAGNOSIS — R3129 Other microscopic hematuria: Secondary | ICD-10-CM | POA: Diagnosis not present

## 2020-08-15 ENCOUNTER — Other Ambulatory Visit: Payer: Self-pay | Admitting: Obstetrics and Gynecology

## 2020-08-15 DIAGNOSIS — Z1231 Encounter for screening mammogram for malignant neoplasm of breast: Secondary | ICD-10-CM

## 2020-08-16 ENCOUNTER — Other Ambulatory Visit: Payer: Self-pay | Admitting: Obstetrics and Gynecology

## 2020-08-16 DIAGNOSIS — E2839 Other primary ovarian failure: Secondary | ICD-10-CM

## 2020-09-25 ENCOUNTER — Ambulatory Visit (INDEPENDENT_AMBULATORY_CARE_PROVIDER_SITE_OTHER): Payer: Self-pay | Admitting: Otolaryngology

## 2020-10-10 ENCOUNTER — Ambulatory Visit
Admission: RE | Admit: 2020-10-10 | Discharge: 2020-10-10 | Disposition: A | Discharge status: Home / Self Care | Payer: Managed Care, Other (non HMO) | Source: Ambulatory Visit | Attending: Obstetrics and Gynecology | Admitting: Obstetrics and Gynecology

## 2020-10-10 ENCOUNTER — Other Ambulatory Visit: Payer: Self-pay

## 2020-10-10 DIAGNOSIS — Z1231 Encounter for screening mammogram for malignant neoplasm of breast: Secondary | ICD-10-CM

## 2020-10-12 ENCOUNTER — Ambulatory Visit (INDEPENDENT_AMBULATORY_CARE_PROVIDER_SITE_OTHER): Payer: Self-pay | Admitting: Otolaryngology

## 2021-01-17 ENCOUNTER — Other Ambulatory Visit: Payer: Self-pay

## 2021-01-17 ENCOUNTER — Ambulatory Visit
Admission: RE | Admit: 2021-01-17 | Discharge: 2021-01-17 | Disposition: A | Discharge status: Home / Self Care | Payer: Managed Care, Other (non HMO) | Source: Ambulatory Visit | Attending: Obstetrics and Gynecology | Admitting: Obstetrics and Gynecology

## 2021-01-17 DIAGNOSIS — E2839 Other primary ovarian failure: Secondary | ICD-10-CM

## 2021-02-02 IMAGING — MG DIGITAL SCREENING BILAT W/ TOMO W/ CAD
8 series · 9 of 24 positions shown · non-contrast
Comparison: Previous exam(s).

CLINICAL DATA: Screening.

EXAM:
DIGITAL SCREENING BILATERAL MAMMOGRAM WITH TOMO AND CAD

[R MLO synth-2D]
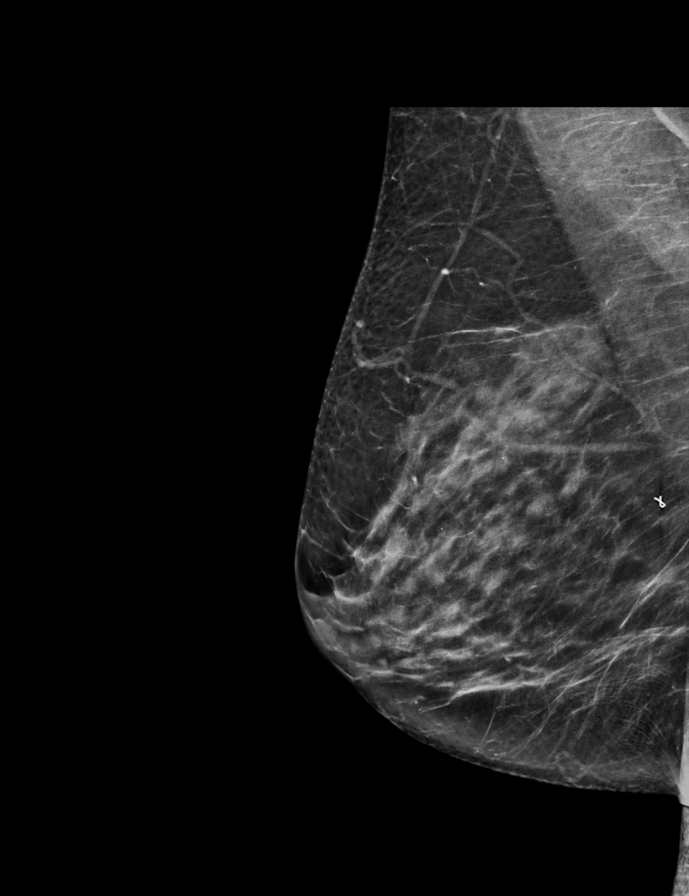

[L CC synth-2D]
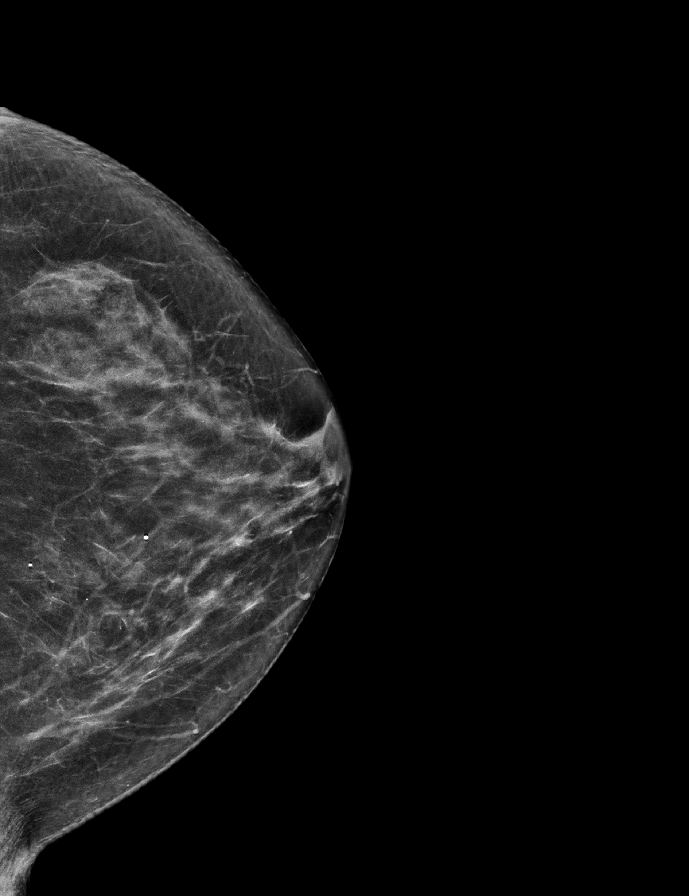

[R CC synth-2D]
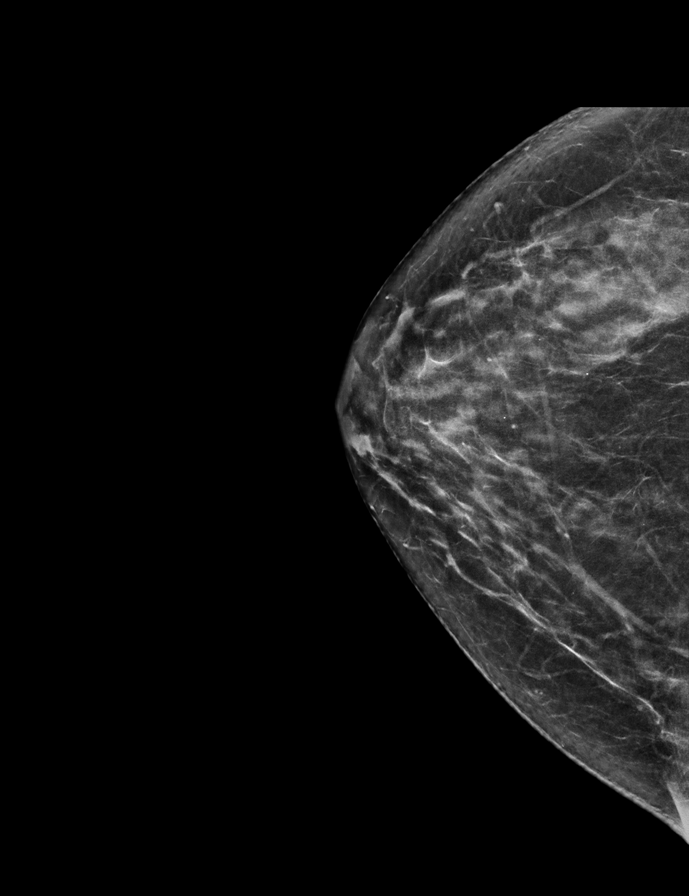

[L MLO synth-2D]
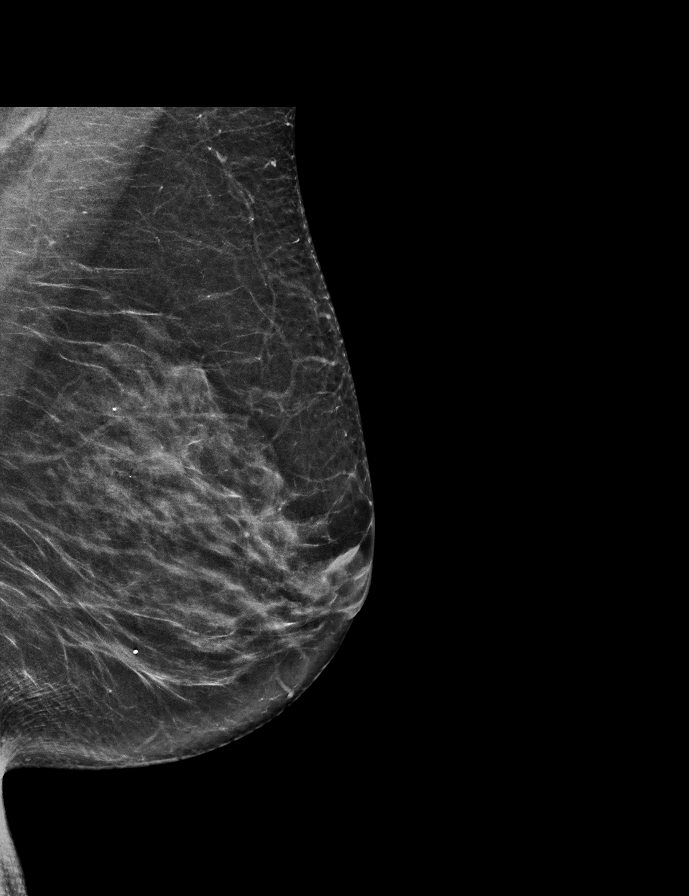

[L CC tomo · 2 of 61 frames shown]
[frame 20/61]
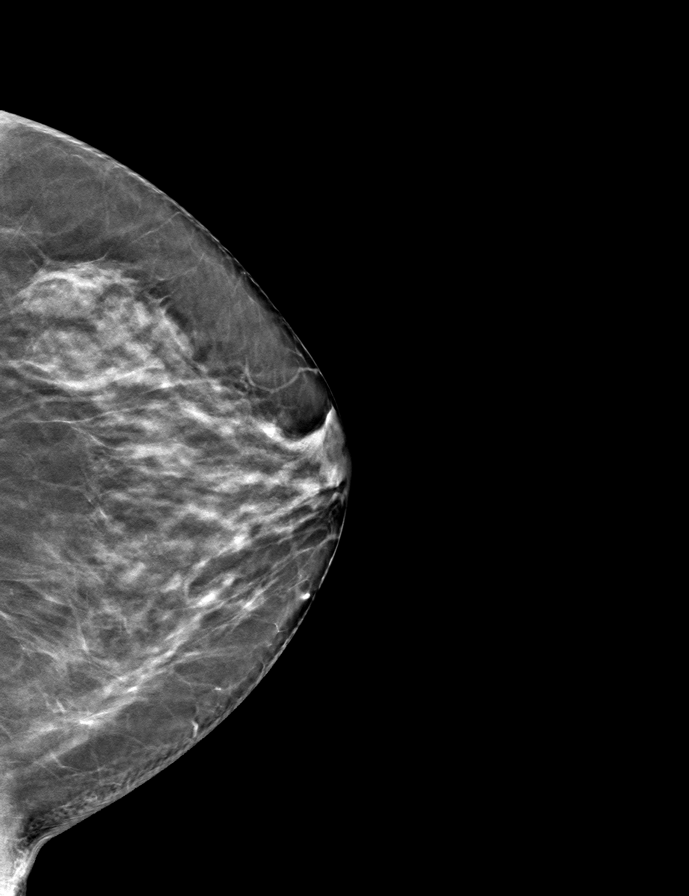
[frame 31/61]
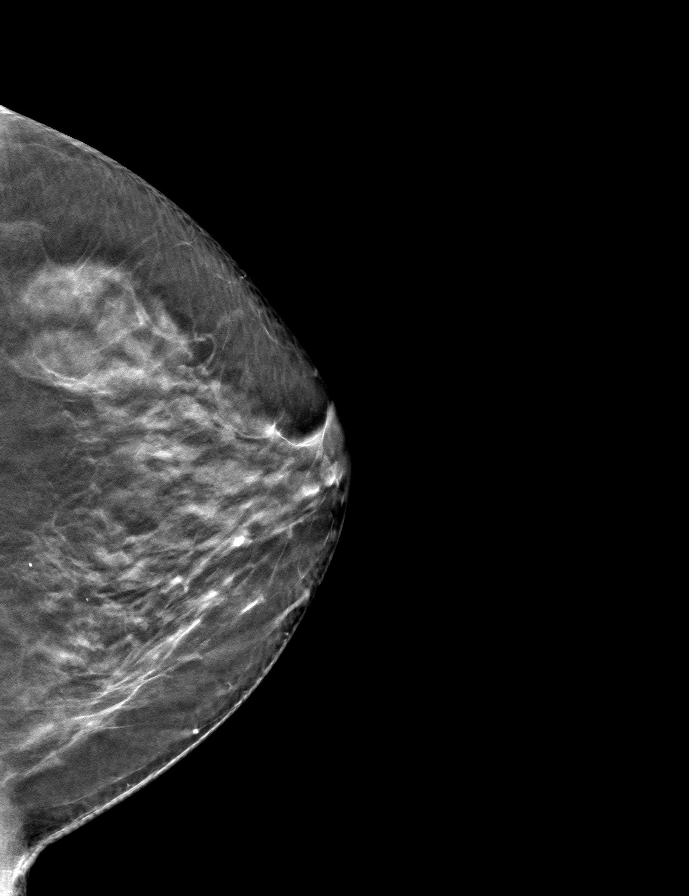

[R CC tomo · tomo slice 31/60.0]
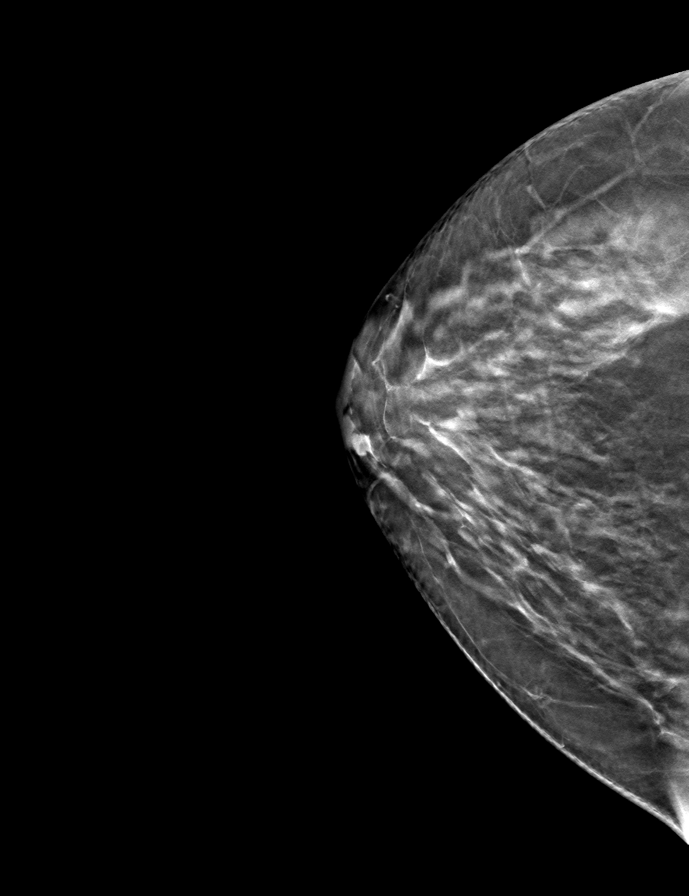

[R MLO tomo · tomo slice 33/65.0]
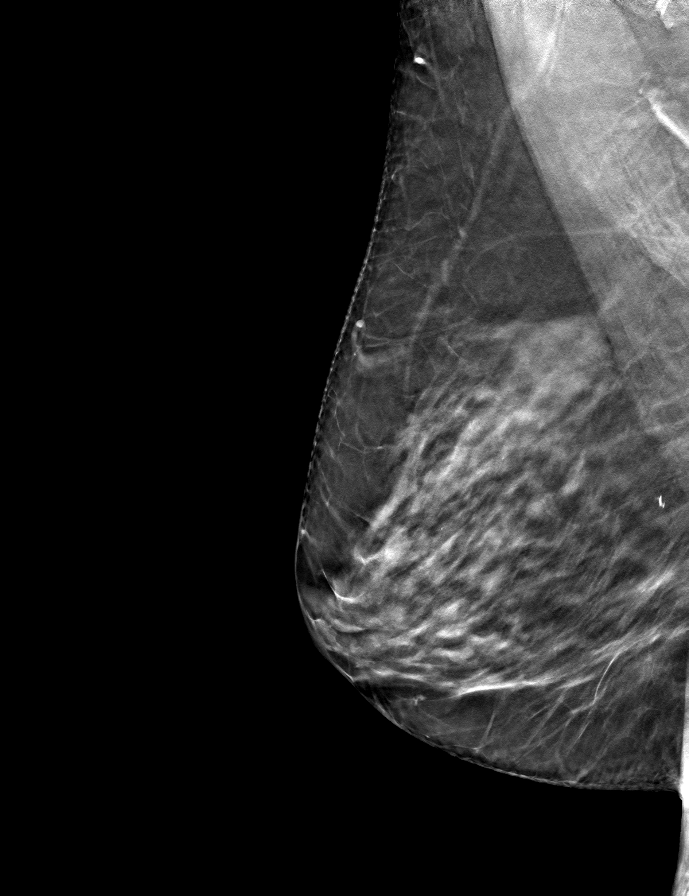

[L MLO tomo · tomo slice 33/66.0]
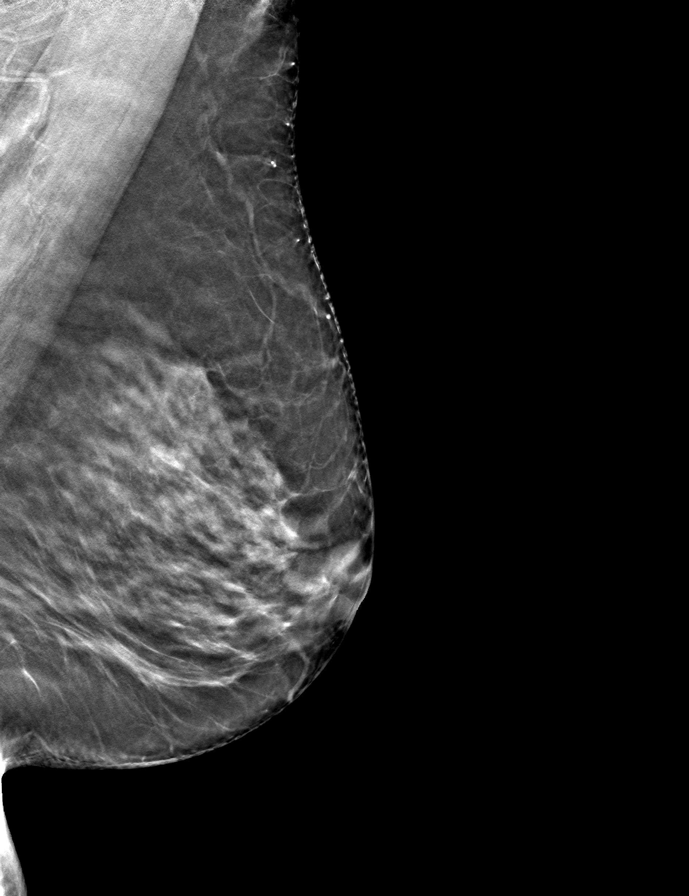

[9 of 24 positions shown; findings below may reference images not displayed]

ACR Breast Density Category c: The breast tissue is heterogeneously
dense, which may obscure small masses.
FINDINGS: There are no findings suspicious for malignancy. Images were
processed with CAD.
IMPRESSION: No mammographic evidence of malignancy. A result letter of this
screening mammogram will be mailed directly to the patient.

RECOMMENDATION:
Screening mammogram in one year. (Code:[id number])

BI-RADS CATEGORY  1: Negative.

## 2021-10-12 ENCOUNTER — Other Ambulatory Visit: Payer: Self-pay | Admitting: Obstetrics and Gynecology

## 2021-10-12 DIAGNOSIS — Z1231 Encounter for screening mammogram for malignant neoplasm of breast: Secondary | ICD-10-CM

## 2021-10-15 ENCOUNTER — Ambulatory Visit
Admission: RE | Admit: 2021-10-15 | Discharge: 2021-10-15 | Disposition: A | Discharge status: Home / Self Care | Payer: Managed Care, Other (non HMO) | Source: Ambulatory Visit | Attending: Obstetrics and Gynecology | Admitting: Obstetrics and Gynecology

## 2021-10-15 DIAGNOSIS — Z1231 Encounter for screening mammogram for malignant neoplasm of breast: Secondary | ICD-10-CM

## 2022-09-25 ENCOUNTER — Other Ambulatory Visit: Payer: Self-pay | Admitting: Obstetrics and Gynecology

## 2022-09-25 DIAGNOSIS — Z0000 Encounter for general adult medical examination without abnormal findings: Secondary | ICD-10-CM

## 2022-11-08 ENCOUNTER — Ambulatory Visit
Admission: RE | Admit: 2022-11-08 | Discharge: 2022-11-08 | Disposition: A | Discharge status: Home / Self Care | Payer: Managed Care, Other (non HMO) | Source: Ambulatory Visit | Attending: Obstetrics and Gynecology | Admitting: Obstetrics and Gynecology

## 2022-11-08 DIAGNOSIS — Z0000 Encounter for general adult medical examination without abnormal findings: Secondary | ICD-10-CM

## 2023-03-18 DIAGNOSIS — G4719 Other hypersomnia: Secondary | ICD-10-CM | POA: Diagnosis not present

## 2023-04-01 DIAGNOSIS — Z130 Encounter for screening for diseases of the blood and blood-forming organs and certain disorders involving the immune mechanism: Secondary | ICD-10-CM | POA: Diagnosis not present

## 2023-04-01 DIAGNOSIS — Z01419 Encounter for gynecological examination (general) (routine) without abnormal findings: Secondary | ICD-10-CM | POA: Diagnosis not present

## 2023-04-01 DIAGNOSIS — R3129 Other microscopic hematuria: Secondary | ICD-10-CM | POA: Diagnosis not present

## 2023-04-02 ENCOUNTER — Other Ambulatory Visit: Payer: Self-pay | Admitting: Obstetrics and Gynecology

## 2023-04-02 DIAGNOSIS — M8580 Other specified disorders of bone density and structure, unspecified site: Secondary | ICD-10-CM

## 2023-04-09 DIAGNOSIS — G4733 Obstructive sleep apnea (adult) (pediatric): Secondary | ICD-10-CM | POA: Diagnosis not present

## 2023-04-29 DIAGNOSIS — G4733 Obstructive sleep apnea (adult) (pediatric): Secondary | ICD-10-CM | POA: Diagnosis not present

## 2023-05-27 DIAGNOSIS — L821 Other seborrheic keratosis: Secondary | ICD-10-CM | POA: Diagnosis not present

## 2023-05-27 DIAGNOSIS — L814 Other melanin hyperpigmentation: Secondary | ICD-10-CM | POA: Diagnosis not present

## 2023-05-27 DIAGNOSIS — D225 Melanocytic nevi of trunk: Secondary | ICD-10-CM | POA: Diagnosis not present

## 2023-05-30 DIAGNOSIS — G4733 Obstructive sleep apnea (adult) (pediatric): Secondary | ICD-10-CM | POA: Diagnosis not present

## 2023-06-29 DIAGNOSIS — G4733 Obstructive sleep apnea (adult) (pediatric): Secondary | ICD-10-CM | POA: Diagnosis not present

## 2023-07-22 DIAGNOSIS — G4733 Obstructive sleep apnea (adult) (pediatric): Secondary | ICD-10-CM | POA: Diagnosis not present

## 2023-07-30 DIAGNOSIS — G4733 Obstructive sleep apnea (adult) (pediatric): Secondary | ICD-10-CM | POA: Diagnosis not present

## 2023-11-05 ENCOUNTER — Other Ambulatory Visit: Payer: Self-pay | Admitting: Obstetrics and Gynecology

## 2023-11-05 DIAGNOSIS — Z0000 Encounter for general adult medical examination without abnormal findings: Secondary | ICD-10-CM

## 2023-11-10 ENCOUNTER — Ambulatory Visit
Admission: RE | Admit: 2023-11-10 | Discharge: 2023-11-10 | Disposition: A | Discharge status: Home / Self Care | Source: Ambulatory Visit | Attending: Obstetrics and Gynecology | Admitting: Obstetrics and Gynecology

## 2023-11-10 DIAGNOSIS — Z0000 Encounter for general adult medical examination without abnormal findings: Secondary | ICD-10-CM

## 2023-11-10 DIAGNOSIS — Z1231 Encounter for screening mammogram for malignant neoplasm of breast: Secondary | ICD-10-CM | POA: Diagnosis not present

## 2023-12-12 ENCOUNTER — Other Ambulatory Visit: Payer: Managed Care, Other (non HMO)

## 2023-12-18 DIAGNOSIS — D485 Neoplasm of uncertain behavior of skin: Secondary | ICD-10-CM | POA: Diagnosis not present

## 2023-12-18 DIAGNOSIS — R208 Other disturbances of skin sensation: Secondary | ICD-10-CM | POA: Diagnosis not present

## 2023-12-18 DIAGNOSIS — L821 Other seborrheic keratosis: Secondary | ICD-10-CM | POA: Diagnosis not present

## 2023-12-18 DIAGNOSIS — L820 Inflamed seborrheic keratosis: Secondary | ICD-10-CM | POA: Diagnosis not present

## 2023-12-18 DIAGNOSIS — L538 Other specified erythematous conditions: Secondary | ICD-10-CM | POA: Diagnosis not present

## 2023-12-23 ENCOUNTER — Ambulatory Visit (HOSPITAL_BASED_OUTPATIENT_CLINIC_OR_DEPARTMENT_OTHER)
Admission: RE | Admit: 2023-12-23 | Discharge: 2023-12-23 | Disposition: A | Discharge status: Home / Self Care | Source: Ambulatory Visit | Attending: Obstetrics and Gynecology | Admitting: Obstetrics and Gynecology

## 2023-12-23 DIAGNOSIS — Z780 Asymptomatic menopausal state: Secondary | ICD-10-CM | POA: Diagnosis not present

## 2023-12-23 DIAGNOSIS — M8580 Other specified disorders of bone density and structure, unspecified site: Secondary | ICD-10-CM | POA: Diagnosis not present

## 2023-12-23 DIAGNOSIS — M8589 Other specified disorders of bone density and structure, multiple sites: Secondary | ICD-10-CM | POA: Diagnosis not present

## 2024-01-23 DIAGNOSIS — Z0000 Encounter for general adult medical examination without abnormal findings: Secondary | ICD-10-CM | POA: Diagnosis not present

## 2024-01-23 DIAGNOSIS — R739 Hyperglycemia, unspecified: Secondary | ICD-10-CM | POA: Diagnosis not present

## 2024-01-23 DIAGNOSIS — E039 Hypothyroidism, unspecified: Secondary | ICD-10-CM | POA: Diagnosis not present

## 2024-01-23 DIAGNOSIS — E782 Mixed hyperlipidemia: Secondary | ICD-10-CM | POA: Diagnosis not present

## 2024-01-23 DIAGNOSIS — Z23 Encounter for immunization: Secondary | ICD-10-CM | POA: Diagnosis not present

## 2024-01-23 DIAGNOSIS — F324 Major depressive disorder, single episode, in partial remission: Secondary | ICD-10-CM | POA: Diagnosis not present

## 2024-01-23 DIAGNOSIS — I10 Essential (primary) hypertension: Secondary | ICD-10-CM | POA: Diagnosis not present

## 2024-01-28 ENCOUNTER — Encounter: Payer: Self-pay | Admitting: *Deleted

## 2024-01-29 ENCOUNTER — Telehealth: Payer: Self-pay | Admitting: Acute Care

## 2024-01-29 DIAGNOSIS — Z87891 Personal history of nicotine dependence: Secondary | ICD-10-CM

## 2024-01-29 DIAGNOSIS — Z122 Encounter for screening for malignant neoplasm of respiratory organs: Secondary | ICD-10-CM

## 2024-01-29 DIAGNOSIS — F17210 Nicotine dependence, cigarettes, uncomplicated: Secondary | ICD-10-CM

## 2024-01-29 NOTE — Telephone Encounter (Signed)
 Lung Cancer Screening Narrative/Criteria Questionnaire (Cigarette Smokers Only- No Cigars/Pipes/vapes)   [name]   SDMV:02/04/24 at 0900a/[place]                                           1956-05-12               LDCT: 02/05/24 at 1140a/[address]    68 y.o.   Phone: [phone]  Lung Screening Narrative (confirm age 16-77 yrs Medicare / 50-80 yrs Private pay insurance)   Insurance information:[employer] medicare   Referring Provider:Harris   This screening involves an initial phone call with a team member from our program. It is called a shared decision making visit. The initial meeting is required by insurance and Medicare to make sure you understand the program. This appointment takes about 15-20 minutes to complete. The CT scan will completed at a separate date/time. This scan takes about 5-10 minutes to complete and you may eat and drink before and after the scan.  Criteria questions for Lung Cancer Screening:   Are you a current or former smoker? Current Age began smoking: 17y   If you are a former smoker, what year did you quit smoking? Minus 8 years prev quit   To calculate your smoking history, I need an accurate estimate of how many packs of cigarettes you smoked per day and for how many years. (Not just the number of PPD you are now smoking)   Years smoking 42 x Packs per day 1 = Pack years 42   (at least 20 pack yrs)   (Make sure they understand that we need to know how much they have smoked in the past, not just the number of PPD they are smoking now)  Do you have a personal history of cancer?  No    Do you have a family history of cancer? Yes  (cancer type and and relative) father/lung  Are you coughing up blood?  No  Have you had unexplained weight loss of 15 lbs or more in the last 6 months? No  It looks like you meet all criteria.     Additional information: N/A

## 2024-02-04 ENCOUNTER — Ambulatory Visit: Admitting: *Deleted

## 2024-02-04 DIAGNOSIS — F17210 Nicotine dependence, cigarettes, uncomplicated: Secondary | ICD-10-CM | POA: Diagnosis not present

## 2024-02-04 NOTE — Patient Instructions (Signed)

## 2024-02-04 NOTE — Progress Notes (Signed)
  Virtual Visit via Telephone Note  I connected with [name] on 02/04/24 at  9:00 AM EDT by telephone and verified that I am speaking with the correct person using two identifiers.  Location: Patient: [name] Provider: Laneta Speaks, RN   I discussed the limitations, risks, security and privacy concerns of performing an evaluation and management service by telephone and the availability of in person appointments. I also discussed with the patient that there may be a patient responsible charge related to this service. The patient expressed understanding and agreed to proceed.   Shared Decision Making Visit Lung Cancer Screening Program [phone])   Eligibility: Age 68 y.o. Pack Years Smoking History Calculation 42 (# packs/per year x # years smoked) Recent History of coughing up blood  no Unexplained weight loss? no ( >Than 15 pounds within the last 6 months ) Prior History Lung / other cancer no (Diagnosis within the last 5 years already requiring surveillance chest CT Scans). Smoking Status Current Smoker Former Smokers: Years since quit: n/a  Quit Date: n/a  Visit Components: Discussion included one or more decision making aids. yes Discussion included risk/benefits of screening. yes Discussion included potential follow up diagnostic testing for abnormal scans. yes Discussion included meaning and risk of over diagnosis. yes Discussion included meaning and risk of False Positives. yes Discussion included meaning of total radiation exposure. yes  Counseling Included: Importance of adherence to annual lung cancer LDCT screening. yes Impact of comorbidities on ability to participate in the program. yes Ability and willingness to under diagnostic treatment. yes  Smoking Cessation Counseling: Current Smokers:  Discussed importance of smoking cessation. yes Information about tobacco cessation classes and interventions provided to patient. yes Patient provided  with ticket for LDCT Scan. no Symptomatic Patient. no  Counseling(Intermediate counseling: > three minutes) 99406 Diagnosis Code: Tobacco Use Z72.0 Asymptomatic Patient yes  Counseling (Intermediate counseling: > three minutes counseling) H9563 Former Smokers:  Discussed the importance of maintaining cigarette abstinence. yes Diagnosis Code: Personal History of Nicotine  Dependence. S12.108 Information about tobacco cessation classes and interventions provided to patient. Yes Patient provided with ticket for LDCT Scan. no Written Order for Lung Cancer Screening with LDCT placed in Epic. Yes (CT Chest Lung Cancer Screening Low Dose W/O CM) [id number] Z12.2-Screening of respiratory organs Z87.891-Personal history of nicotine  dependence   Laneta Speaks, RN

## 2024-02-05 ENCOUNTER — Inpatient Hospital Stay
Admission: RE | Admit: 2024-02-05 | Discharge: 2024-02-05 | Disposition: A | Discharge status: Home / Self Care | Source: Ambulatory Visit | Attending: Family Medicine | Admitting: Family Medicine

## 2024-02-05 DIAGNOSIS — Z87891 Personal history of nicotine dependence: Secondary | ICD-10-CM

## 2024-02-05 DIAGNOSIS — F17210 Nicotine dependence, cigarettes, uncomplicated: Secondary | ICD-10-CM | POA: Diagnosis not present

## 2024-02-05 DIAGNOSIS — Z122 Encounter for screening for malignant neoplasm of respiratory organs: Secondary | ICD-10-CM

## 2024-02-09 DIAGNOSIS — S30861A Insect bite (nonvenomous) of abdominal wall, initial encounter: Secondary | ICD-10-CM | POA: Diagnosis not present

## 2024-02-09 DIAGNOSIS — W57XXXA Bitten or stung by nonvenomous insect and other nonvenomous arthropods, initial encounter: Secondary | ICD-10-CM | POA: Diagnosis not present

## 2024-02-17 NOTE — Progress Notes (Addendum)
 Spent 3 1/2 minutes intermediate counseling patient on importance of smoking cessation.

## 2024-02-18 ENCOUNTER — Other Ambulatory Visit: Payer: Self-pay

## 2024-02-18 DIAGNOSIS — Z87891 Personal history of nicotine dependence: Secondary | ICD-10-CM

## 2024-02-18 DIAGNOSIS — F17210 Nicotine dependence, cigarettes, uncomplicated: Secondary | ICD-10-CM

## 2024-02-18 DIAGNOSIS — Z122 Encounter for screening for malignant neoplasm of respiratory organs: Secondary | ICD-10-CM

## 2024-02-23 DIAGNOSIS — K080 Exfoliation of teeth due to systemic causes: Secondary | ICD-10-CM | POA: Diagnosis not present

## 2024-04-01 DIAGNOSIS — Z01419 Encounter for gynecological examination (general) (routine) without abnormal findings: Secondary | ICD-10-CM | POA: Diagnosis not present

## 2024-05-07 DIAGNOSIS — L03213 Periorbital cellulitis: Secondary | ICD-10-CM | POA: Diagnosis not present

## 2024-05-21 DIAGNOSIS — M1611 Unilateral primary osteoarthritis, right hip: Secondary | ICD-10-CM | POA: Diagnosis not present

## 2024-05-24 ENCOUNTER — Telehealth (HOSPITAL_BASED_OUTPATIENT_CLINIC_OR_DEPARTMENT_OTHER): Payer: Self-pay | Admitting: *Deleted

## 2024-05-24 NOTE — Telephone Encounter (Signed)
 S/w the pt and she has been scheduled new pt in [facility] ok per preop APP Katlyn West, NP. Pt appt 05/25/24 Hao Meng, PAC. Pt has been given the [address] address.

## 2024-05-24 NOTE — Telephone Encounter (Signed)
   Name: [name]  DOB: May 02, 1956  MRN: [id number]  Primary Cardiologist: None  Chart reviewed as part of pre-operative protocol coverage. Because of [name] past medical history and time since last visit, she will require a new patient visit in-office visit in order to better assess preoperative cardiovascular risk. Patient can be seen in [facility] clinic.   Pre-op covering staff: - Please schedule appointment and call patient to inform them. If patient already had an upcoming appointment within acceptable timeframe, please add pre-op clearance to the appointment notes so provider is aware. - Please contact requesting surgeon's office via preferred method (i.e, phone, fax) to inform them of need for appointment prior to surgery.  Tymber Stallings D Tilly Pernice, NP  05/24/2024, 1:12 PM

## 2024-05-24 NOTE — Telephone Encounter (Signed)
-----   Message from Angeline H sent at 05/24/2024 12:33 PM EST ----- Regarding: Preop NP Hi Niels,  We received a referral for the patient, she need preop clearance for her upcoming shoulder procedure. She said she is scheduled to get the procedure on 06/10/24. We don't have any MD have availability prior to 12/11. Sending this message if the patient can be scheduled to [facility] clinic.   Thank you,

## 2024-05-24 NOTE — Telephone Encounter (Signed)
 I s/w the pt that she has been referred to cardiology per PCP. Dr. Arloa. Referral also states about surgery for shoulder. Pt said no it is hip replacement she is having done with Dr. Evalene Chancy. I called surgery scheduler Burnard [phone] to obtain needed surgery information so that we can schedule the new pt appt for the pt.       Pre-operative Risk Assessment    Patient Name: [name]  DOB: 11/02/1955 MRN: [id number]   Date of last office visit: NONE Date of next office visit: NONE- PT NEEDS NEW PT APPT; WILL HAVE PREOP APP REVIEW IF NEW PT APPT WITH MD OR CAN PT SEE [facility] PROVIDER   Request for Surgical Clearance    Procedure:  RIGHT THA  Date of Surgery:  Clearance 06/10/24                                Surgeon:  DR. EVALENE CHANCY Surgeon's Group or Practice Name:  [facility] Phone number:  [phone] KELLY  Fax number:  [phone]   Type of Clearance Requested:   - Medical    Type of Anesthesia:  Spinal   Additional requests/questions:    [name]   05/24/2024, 1:00 PM

## 2024-05-25 ENCOUNTER — Encounter: Payer: Self-pay | Admitting: Physician Assistant

## 2024-05-25 ENCOUNTER — Ambulatory Visit: Attending: Physician Assistant | Admitting: Physician Assistant

## 2024-05-25 VITALS — BP 124/80 | HR 66 | Ht 61.0 in | Wt 144.0 lb

## 2024-05-25 DIAGNOSIS — G4733 Obstructive sleep apnea (adult) (pediatric): Secondary | ICD-10-CM

## 2024-05-25 DIAGNOSIS — F1011 Alcohol abuse, in remission: Secondary | ICD-10-CM

## 2024-05-25 DIAGNOSIS — J439 Emphysema, unspecified: Secondary | ICD-10-CM

## 2024-05-25 DIAGNOSIS — I10 Essential (primary) hypertension: Secondary | ICD-10-CM | POA: Diagnosis not present

## 2024-05-25 DIAGNOSIS — Z01818 Encounter for other preprocedural examination: Secondary | ICD-10-CM | POA: Diagnosis not present

## 2024-05-25 NOTE — Patient Instructions (Signed)
 Medication Instructions:  Your physician recommends that you continue on your current medications as directed. Please refer to the Current Medication list given to you today.  *If you need a refill on your cardiac medications before your next appointment, please call your pharmacy*  Lab Work: None ordered  If you have labs (blood work) drawn today and your tests are completely normal, you will receive your results only by: MyChart Message (if you have MyChart) OR A paper copy in the mail If you have any lab test that is abnormal or we need to change your treatment, we will call you to review the results.  Testing/Procedures: Your physician has requested that you have an echocardiogram. Echocardiography is a painless test that uses sound waves to create images of your heart. It provides your doctor with information about the size and shape of your heart and how well your heart's chambers and valves are working. This procedure takes approximately one hour. There are no restrictions for this procedure. Please do NOT wear cologne, perfume, aftershave, or lotions (deodorant is allowed). Please arrive 15 minutes prior to your appointment time.  Please note: We ask at that you not bring children with you during ultrasound (echo/ vascular) testing. Due to room size and safety concerns, children are not allowed in the ultrasound rooms during exams. Our front office staff cannot provide observation of children in our lobby area while testing is being conducted. An adult accompanying a patient to their appointment will only be allowed in the ultrasound room at the discretion of the ultrasound technician under special circumstances. We apologize for any inconvenience.   Follow-Up: At [facility] you and your health needs are our priority.  As part of our continuing mission to provide you with exceptional heart care, our providers are all part of one team.  This team includes your primary  Cardiologist (physician) and Advanced Practice Providers or APPs (Physician Assistants and Nurse Practitioners) who all work together to provide you with the care you need, when you need it.  Your next appointment:   Depending upon test results   Provider:   Scot Ford, PA-C          We recommend signing up for the patient portal called MyChart.  Sign up information is provided on this After Visit Summary.  MyChart is used to connect with patients for Virtual Visits (Telemedicine).  Patients are able to view lab/test results, encounter notes, upcoming appointments, etc.  Non-urgent messages can be sent to your provider as well.   To learn more about what you can do with MyChart, go to [email or web address].   Other Instructions

## 2024-05-25 NOTE — Telephone Encounter (Signed)
   Patient Name: [name]  DOB: [birth year] MRN: [id number]  Primary Cardiologist: None  Chart reviewed as part of pre-operative protocol coverage. Given past medical history and time since last visit, based on ACC/AHA guidelines, [name] is at acceptable risk for the planned procedure without further cardiovascular testing. She denies any anginal symptom. EKG is normal. She has no problem accomplishing more than 4 METS of activity. Based on the current cardiology protocol, she is at acceptable risk to proceed with upcoming surgery from the cardiac perspective. She is a low risk patient going for a low risk surgery. Her RCRI perioperative risk is 0.5% of major cardiac event. We have ordered a non-urgent echocardiogram, this does not need to be done prior to the upcoming surgery.   The patient was advised that if she develops new symptoms prior to surgery to contact our office to arrange for a follow-up visit, and she verbalized understanding.  I will route this recommendation to the requesting party via Epic fax function and remove from pre-op pool.  Please call with questions.  Alfio Loescher, [place] 05/25/2024, 11:59 AM

## 2024-05-25 NOTE — Progress Notes (Unsigned)
  Cardiology Office Note   Date:  05/25/2024  ID:  [name], [name] 1956/01/25, MRN [id number] PCP: Arloa Elsie SAUNDERS, MD  [facility] Providers Cardiologist:  None { Click to update primary MD,subspecialty MD or APP then REFRESH:1}    History of Present Illness [name] is a 68 y.o. female with past medical history of alcoholism, depression, PTSD, tobacco abuse, hypertension, hypothyroidism, severe OSA in 04/2023 with AHI at 38 and O2 nadir 81% and emphysema.  Patient has no prior cardiac history.  CT lung cancer screening performed on 02/05/2024 showed emphysema, no acute finding.  EKG showed normal sinus rhythm, nonspecific T wave changes.  Patient was last seen by PCP on 05/07/2024 and was prescribed Augmentin  for periorbital cellulitis of the left eye.  Last lipid panel obtained in July 2025 showed total cholesterol 157, triglyceride 98, HDL 51, LDL 87.  Free T4 was normal 0.77 in July.  TSH normal 3.30.  Creatinine 0.64, BUN 12, eGFR 98, sodium 141, potassium 4.6, normal liver enzyme.  White blood cell count 7.3, hemoglobin 14.4, platelets 279.  Hemoglobin A1c 5.8.  Patient presents today for [facility] evaluation.  She has no recent chest pain or shortness of breath.  She does not have any prior cardiac history.  She did drink heavily for 10 years when she was depressed many years ago.  She has quit alcohol  since.  Despite CT image of emphysema and her smoking history, recent CT image did not show significant coronary artery calcification.  I did recommend a nonurgent echocardiogram given history of the heavy drinking.  This can be done after her surgery.  She has no problem accomplishing more than 4 METS of activity.  Based on the current cardiology guideline, she is at acceptable risk to proceed with upcoming surgery without further workup.  She is considered a low risk patient proceeding with a low risk surgery.  RCRI perioperative risk is 0.5%.  If her echocardiogram is  normal, she can follow-up with cardiology service as needed.  ROS: ***  Studies Reviewed      *** Risk Assessment/Calculations {Does this patient have ATRIAL FIBRILLATION?[phone]}         Physical Exam VS:  BP 124/80   Pulse 66   Ht 5' 1 (1.549 m)   Wt 144 lb (65.3 kg)   SpO2 97%   BMI 27.21 kg/m        Wt Readings from Last 3 Encounters:  05/25/24 144 lb (65.3 kg)  01/11/19 152 lb (68.9 kg)  12/28/18 152 lb (68.9 kg)    GEN: Well nourished, well developed in no acute distress NECK: No JVD; No carotid bruits CARDIAC: ***RRR, no murmurs, rubs, gallops RESPIRATORY:  Clear to auscultation without rales, wheezing or rhonchi  ABDOMEN: Soft, non-tender, non-distended EXTREMITIES:  No edema; No deformity   ASSESSMENT AND PLAN ***    {Are you ordering a CV Procedure (e.g. stress test, cath, DCCV, TEE, etc)?   Press F2        :[id number]}  Dispo: ***  Signed, Scot Ford, PA

## 2024-05-31 DIAGNOSIS — M1611 Unilateral primary osteoarthritis, right hip: Secondary | ICD-10-CM | POA: Diagnosis not present

## 2024-06-10 DIAGNOSIS — M1611 Unilateral primary osteoarthritis, right hip: Secondary | ICD-10-CM | POA: Diagnosis not present

## 2024-06-14 DIAGNOSIS — M1611 Unilateral primary osteoarthritis, right hip: Secondary | ICD-10-CM | POA: Diagnosis not present

## 2024-07-13 ENCOUNTER — Telehealth (HOSPITAL_COMMUNITY): Payer: Self-pay | Admitting: Physician Assistant

## 2024-07-13 NOTE — Telephone Encounter (Signed)
 Patient cancelled echo and does not wish to reschedule. Order will be removed from the echo WQ. Thank you.

## 2024-07-14 ENCOUNTER — Ambulatory Visit (HOSPITAL_COMMUNITY): Payer: Self-pay
